# Patient Record
Sex: Male | Born: 1945 | Race: White | Hispanic: No | Marital: Married | Smoking: Former smoker
Health system: Southern US, Community
[De-identification: ages and names within clinical notes are randomized; demographics above are authoritative.]

## PROBLEM LIST (undated history)

## (undated) DIAGNOSIS — Z8719 Personal history of other diseases of the digestive system: Secondary | ICD-10-CM

## (undated) DIAGNOSIS — N365 Urethral false passage: Secondary | ICD-10-CM

## (undated) DIAGNOSIS — I10 Essential (primary) hypertension: Secondary | ICD-10-CM

## (undated) DIAGNOSIS — I209 Angina pectoris, unspecified: Secondary | ICD-10-CM

## (undated) DIAGNOSIS — F0390 Unspecified dementia without behavioral disturbance: Secondary | ICD-10-CM

## (undated) DIAGNOSIS — K219 Gastro-esophageal reflux disease without esophagitis: Secondary | ICD-10-CM

## (undated) DIAGNOSIS — I2510 Atherosclerotic heart disease of native coronary artery without angina pectoris: Secondary | ICD-10-CM

## (undated) DIAGNOSIS — E7800 Pure hypercholesterolemia, unspecified: Secondary | ICD-10-CM

## (undated) DIAGNOSIS — M1990 Unspecified osteoarthritis, unspecified site: Secondary | ICD-10-CM

## (undated) DIAGNOSIS — K449 Diaphragmatic hernia without obstruction or gangrene: Secondary | ICD-10-CM

## (undated) DIAGNOSIS — I639 Cerebral infarction, unspecified: Secondary | ICD-10-CM

## (undated) DIAGNOSIS — N189 Chronic kidney disease, unspecified: Secondary | ICD-10-CM

## (undated) DIAGNOSIS — F329 Major depressive disorder, single episode, unspecified: Secondary | ICD-10-CM

## (undated) DIAGNOSIS — I219 Acute myocardial infarction, unspecified: Secondary | ICD-10-CM

## (undated) DIAGNOSIS — Z860101 Personal history of adenomatous and serrated colon polyps: Secondary | ICD-10-CM

## (undated) DIAGNOSIS — M19041 Primary osteoarthritis, right hand: Secondary | ICD-10-CM

## (undated) DIAGNOSIS — N400 Enlarged prostate without lower urinary tract symptoms: Secondary | ICD-10-CM

## (undated) DIAGNOSIS — D649 Anemia, unspecified: Secondary | ICD-10-CM

## (undated) DIAGNOSIS — C801 Malignant (primary) neoplasm, unspecified: Secondary | ICD-10-CM

## (undated) HISTORY — PX: TONSILLECTOMY: SUR1361

## (undated) HISTORY — PX: COLONOSCOPY: SHX174

## (undated) HISTORY — PX: ESOPHAGOGASTRODUODENOSCOPY: SHX5428

## (undated) HISTORY — PX: CARDIAC CATHETERIZATION: SHX172

## (undated) HISTORY — PX: JOINT REPLACEMENT: SHX530

## (undated) HISTORY — PX: REPLACEMENT TOTAL KNEE: SUR1224

---

## 2010-02-07 ENCOUNTER — Ambulatory Visit: Payer: Self-pay

## 2010-03-28 ENCOUNTER — Ambulatory Visit: Payer: Self-pay | Admitting: Anesthesiology

## 2010-04-04 ENCOUNTER — Ambulatory Visit: Payer: Self-pay | Admitting: General Practice

## 2012-02-10 ENCOUNTER — Emergency Department: Payer: Self-pay | Admitting: Emergency Medicine

## 2012-02-10 LAB — CBC
HCT: 42.3 % (ref 40.0–52.0)
MCH: 32.2 pg (ref 26.0–34.0)
MCV: 93 fL (ref 80–100)
Platelet: 124 10*3/uL — ABNORMAL LOW (ref 150–440)
RDW: 13.3 % (ref 11.5–14.5)

## 2012-02-10 LAB — COMPREHENSIVE METABOLIC PANEL
Albumin: 3.7 g/dL (ref 3.4–5.0)
Alkaline Phosphatase: 78 U/L (ref 50–136)
Bilirubin,Total: 0.6 mg/dL (ref 0.2–1.0)
Calcium, Total: 8.4 mg/dL — ABNORMAL LOW (ref 8.5–10.1)
Chloride: 106 mmol/L (ref 98–107)
Co2: 29 mmol/L (ref 21–32)
Creatinine: 1.17 mg/dL (ref 0.60–1.30)
EGFR (African American): >60
EGFR (Non-African Amer.): >60
Glucose: 102 mg/dL — ABNORMAL HIGH (ref 65–99)
Osmolality: 283 (ref 275–301)
SGOT(AST): 19 U/L (ref 15–37)
Sodium: 140 mmol/L (ref 136–145)

## 2012-02-10 LAB — LIPASE, BLOOD: Lipase: 101 U/L (ref 73–393)

## 2012-02-10 LAB — TROPONIN I: Troponin-I: <0.02 ng/mL

## 2012-03-13 ENCOUNTER — Ambulatory Visit: Payer: Self-pay | Admitting: Unknown Physician Specialty

## 2012-03-18 ENCOUNTER — Ambulatory Visit: Payer: Self-pay | Admitting: Unknown Physician Specialty

## 2012-03-19 LAB — PATHOLOGY REPORT

## 2012-06-18 ENCOUNTER — Ambulatory Visit: Payer: Self-pay | Admitting: Unknown Physician Specialty

## 2013-02-01 ENCOUNTER — Ambulatory Visit: Payer: Self-pay

## 2013-06-28 DIAGNOSIS — I10 Essential (primary) hypertension: Secondary | ICD-10-CM | POA: Insufficient documentation

## 2013-08-02 ENCOUNTER — Ambulatory Visit: Payer: Self-pay | Admitting: Unknown Physician Specialty

## 2013-08-04 ENCOUNTER — Ambulatory Visit: Payer: Self-pay | Admitting: Unknown Physician Specialty

## 2013-08-06 LAB — PATHOLOGY REPORT

## 2013-08-16 ENCOUNTER — Ambulatory Visit: Payer: Self-pay | Admitting: Unknown Physician Specialty

## 2014-05-04 ENCOUNTER — Ambulatory Visit: Payer: Self-pay | Admitting: General Practice

## 2014-05-12 DIAGNOSIS — M1710 Unilateral primary osteoarthritis, unspecified knee: Secondary | ICD-10-CM | POA: Insufficient documentation

## 2014-05-12 DIAGNOSIS — M179 Osteoarthritis of knee, unspecified: Secondary | ICD-10-CM | POA: Insufficient documentation

## 2014-05-16 ENCOUNTER — Inpatient Hospital Stay
Admit: 2014-05-16 | Disposition: A | Discharge status: Home / Self Care | Payer: Self-pay | Attending: General Practice | Admitting: General Practice

## 2014-05-17 LAB — BASIC METABOLIC PANEL
Anion Gap: 7 (ref 7–16)
BUN: 20 mg/dL
CHLORIDE: 107 mmol/L
CREATININE: 1.02 mg/dL
Calcium, Total: 7.9 mg/dL — ABNORMAL LOW
Co2: 25 mmol/L
GLUCOSE: 101 mg/dL — AB
POTASSIUM: 3.7 mmol/L
SODIUM: 139 mmol/L

## 2014-05-17 LAB — HEMOGLOBIN: HGB: 10.9 g/dL — AB (ref 13.0–18.0)

## 2014-05-17 LAB — PLATELET COUNT: PLATELETS: 129 10*3/uL — AB (ref 150–440)

## 2014-05-18 LAB — BASIC METABOLIC PANEL
Anion Gap: 6 — ABNORMAL LOW (ref 7–16)
BUN: 14 mg/dL
CALCIUM: 8.2 mg/dL — AB
CHLORIDE: 101 mmol/L
CO2: 28 mmol/L
Creatinine: 0.96 mg/dL
EGFR (African American): >60
Glucose: 123 mg/dL — ABNORMAL HIGH
POTASSIUM: 4 mmol/L
Sodium: 135 mmol/L

## 2014-05-18 LAB — PLATELET COUNT: PLATELETS: 125 10*3/uL — AB (ref 150–440)

## 2014-05-18 LAB — HEMOGLOBIN: HGB: 11.6 g/dL — AB (ref 13.0–18.0)

## 2014-06-02 DIAGNOSIS — Z96659 Presence of unspecified artificial knee joint: Secondary | ICD-10-CM | POA: Insufficient documentation

## 2014-06-02 DIAGNOSIS — Z96651 Presence of right artificial knee joint: Secondary | ICD-10-CM | POA: Insufficient documentation

## 2014-06-19 NOTE — Consult Note (Signed)
PATIENT NAME:  Craig [name], Craig [name] MR#:  [id number] DATE OF BIRTH:  07/14/1945  DATE OF CONSULTATION:  05/18/2014  REFERRING PHYSICIAN:   CONSULTING PHYSICIAN:  Earleen Newport. Volanda Napoleon, MD  REFERRING PHYSICIAN: Laurice Record. Holley Bouche., M.D.   PRIMARY CARE PHYSICIAN: Tracie Harrier, M.D., with the [facility]   REASON FOR CONSULTATION: Hypertension.   HISTORY OF PRESENT ILLNESS: Craig [name] was admitted on March 28 for a right total knee replacement by Dr. Marry Guan under spinal anesthesia. He does have a history of hypertension, which is usually controlled on lisinopril 30 mg daily. He has been receiving lisinopril during this hospitalization. He reports that his usual blood pressure is somewhere between 130 to 140/60 to 65. Blood pressures were very well controlled on admission; however, yesterday, March 29, blood pressures escalated throughout the day up to a high of 192/98. He also reports a headache and a left eye vision abnormality. The headache is new and has developed during the hospitalization. The left eye vision abnormality is described as a translucent floating object, which was present prior to this hospitalization. He denies chest pain or shortness of breath. No dependent edema. He does report significant pain after the surgery. Currently his pain is 6 to 7 on the 10 scale. He states that yesterday, his pain was "out of this world," 10 out of 10 on a 10 pain scale. Hospitalist services have been asked to evaluate and assist with management of hypertension.   PAST MEDICAL HISTORY:  1. Hypertension.  2. Hyperlipidemia.  3. History of duodenal ulcer.  4. Gastroesophageal reflux disease.  5. Benign prosthetic hypertrophy.   SOCIAL HISTORY: He lives at home with his wife. He does not smoke cigarettes. He drinks approximately 2 ounces of alcohol every 6 months. 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 He does not use home oxygen, a wheelchair, or any other assistive devices.   FAMILY  MEDICAL HISTORY: Positive for lung cancer in his father, cancer of an unknown primary in his mother. No family medical history of coronary artery disease, strokes or renal disease.    HOME MEDICATIONS: 1. Acetaminophen 500 mg 2 tablets once a day as needed for pain.  2. Aspirin 81 mg daily.  3. Tramadol 50 mg 1 to 2 tablets every 4 hours as needed for pain.  4. Lisinopril 30 mg 1 tablet once a day.  5. Tums 500 mg 2 tablets 3 times a day as needed for indigestion.  6. Meclizine 25 mg 1 tablet 3 times a day as needed for sinus issues or dizziness.  7. Zyrtec 10 mg 1 tablet once a day in the morning.  8. Simvastatin 40 mg 1 tablet once a day at bedtime.  9. Docusate sodium 100 mg 1 tablet twice a day.  10. Fluticasone 0.05 mg/inhalation 2 sprays to each nostril once a day.  11. Omeprazole 40 mg 1 tablet daily.  12. Once a day men's health formula multivitamin.  13. Vitamin E 400 international units 2 capsules once a day in the morning.   ALLERGIES: THE PATIENT IS ALLERGIC TO PEANUTS.   PAST SURGICAL HISTORY:  1. Right foot surgery.  2. Left knee surgery.  3. Skin cancer removal from the left arm.   REVIEW OF SYSTEMS:  GENERAL: Negative for fatigue, weakness, or change in weight.  HEENT: Positive for left eye visual disturbance as described above; otherwise, no other changes in vision or hearing. No pain in the eyes or ears. No sore throat or difficulty swallowing.  RESPIRATORY: No shortness of breath, cough, wheezing, hemoptysis, or history of COPD.  CARDIOVASCULAR: No chest pain, orthopnea, palpitations, syncope, or edema. ABDOMEN: No abdominal pain. No diarrhea, constipation, melena, or other change in bowel habits.  MUSCULOSKELETAL: Positive for pain in the right knee; otherwise no new pain in the neck, back, shoulders, knees, or hips. No history of gout.  SKIN: No new skin lesions or rashes. He does have a new surgical wound over the right knee.  NEUROLOGIC: No focal numbness or  weakness. No confusion. No history of CVA, no seizure.  PSYCHIATRIC: No history of bipolar disorder or schizophrenia.   PHYSICAL EXAMINATION:  VITAL SIGNS: Temperature 100 degrees, pulse 76, respirations 18, blood pressure 173/88, oxygenation 96% on room air.  HEENT: Pupils equal, round, and reactive to light. Conjunctivae clear. Extraocular motion intact. Oral mucous membranes are pink and moist. Good dentition.  NECK: Supple. No cervical lymphadenopathy. Trachea is midline.  RESPIRATORY: Lungs are clear to auscultation bilaterally with good air movement. No wheezing, rhonchi or rales.  CARDIOVASCULAR: Regular rate and rhythm. No murmurs, rubs, or gallops. No carotid bruits. No peripheral edema. Peripheral pulses 1+.  ABDOMEN: Soft, nontender, nondistended. Bowel sounds are normal. No guarding, no rebound, no hepatosplenomegaly.  SKIN: No unusual rashes or lesions. The right knee is bandaged and in a cooling wrap, unable to see be the surgical incision.  MUSCULOSKELETAL: Strength is 5/5 in the upper extremities, and left lower extremity. I did not test the right extremity.  NEUROLOGIC: Cranial nerves II through XII grossly intact. Strength and sensation intact. Nonfocal.  PSYCHIATRIC: The patient is alert and oriented with good insight into his clinical conditions.   LABORATORY DATA: Sodium 135, potassium 4.0, chloride 101, bicarbonate 28, BUN 14, creatinine 0.96, glucose 123, calcium 8.2. White blood cells 4.4, hemoglobin 11.6, platelets 125,000. INR is 1.0.   UA is negative for signs of infection.   IMAGING: Right knee x-ray shows satisfactory postoperative right knee replacement.   EKG shows sinus bradycardia, possible LVH.   ASSESSMENT AND PLAN: 1. Hypertension: He does have history of hypertension, which is usually controlled on lisinopril 30 mg daily. He is currently on the same. He did receive 1 dose of clonidine early this morning of 0.1 mg when his blood pressure was 191/95, which  has brought his blood pressure down slightly during the day, but it seems to be creeping back up. I think that his escalated blood pressure is due to pain. I will place orders for hydralazine for systolic blood pressure greater than 364 or diastolics over 680. I will not start a new chronic antihypertenisve as he is usually well controlled. I would recommend increasing his pain regimen or assessing more frequently for pain control.  2. Fever: The patient has had 2 temperatures of 100 degrees or greater. We will obtain blood cultures. He does not have any other signs or symptoms of infection. His increased temperature may likely be due to pain and postsurgical changes. Hold off on antibiotics. 3. Acute blood loss anemia: Initial hemoglobin 13.7 dropped to 10.9 after surgery and is now stabilizing back at 11.6. Continue to monitor. 4. Thrombocytopenia: The patient had platelets of 142,000 on admission, and are down to 125,000. He is on Lovenox for deep vein thrombosis prophylaxis and platelets will need to be monitored carefully.  5. Hyperlipidemia: Continue statin.  6. Gastroesophageal reflux disease: Continue with PPI.  7. Postsurgical recovery: Per primary team, orthopedics.  8. Deep vein thrombosis prophylaxis: Lovenox.   CODE STATUS:  The patient is a FULL CODE.   Thank you for this consultation. Dr. Ginette Pitman is the patient's primary care provider, and he will be seeing this patient in the morning.   TIME SPENT ON CONSULTATION: 40 minutes.    ____________________________ Earleen Newport. Volanda Napoleon, MD cpw:JT D: 05/18/2014 12:02:57 ET T: 05/18/2014 13:11:02 ET JOB#: [id number]  cc: Barnetta Chapel P. Volanda Napoleon, MD, <Dictator> Aldean Jewett MD ELECTRONICALLY SIGNED 05/18/2014 18:28

## 2014-06-19 NOTE — Discharge Summary (Signed)
PATIENT NAME:  Craig [name], Craig [name] MR#:  [id number] DATE OF BIRTH:  24-May-1945  SUPERVISING PHYSICIAN:  Dr. Skip Estimable.  ADMITTING DIAGNOSIS:  Degenerative arthrosis of right knee.   DISCHARGE DIAGNOSIS:  Degenerative arthrosis of the right knee.   HISTORY OF PRESENT ILLNESS:  The patient is a pleasant 69 year old gentleman who has been followed at [facility] for progression of right knee pain.  The patient has had a long history of progressive knee pain.  He did have a left knee arthroscopy several years ago.  The patient was complaining of pain mostly along the medial aspect of the knee.  He states that the pain was aggravated with weightbearing activities.  He had not seen any significant improvement in his condition despite the use of Tylenol and tramadol as well as activity modification.  He was unable to tolerate nonsteroidal anti-inflammatory drugs secondary to GI bleed and issues.  He was noted to have difficulty with ascending and descending stairs and had actually gone to coming down stairs backwards.  On occasion, he was noted to have some swelling but denied any locking or giving-way of the knee.  The patient states that his pain had progressed to the point that it was significantly interfering with his activities of daily living.  X-rays taken in [facility] showed narrowing of the medial cartilage space with associated varus alignment.  He was noted to have osteophyte as well as subchondral sclerosis.  After discussion of the risks and benefits of surgical intervention, the patient expressed his understanding of the risks and benefits and agreed for plans for surgical intervention.   PROCEDURE:  Right total knee arthroplasty using computer-assisted navigation.   ANESTHESIA:  Spinal.   SOFT TISSUE RELEASE:  Anterior cruciate ligament, posterior cruciate ligament, deep and superficial medial collateral ligaments, as well as the patellofemoral ligament.   IMPLANTS UTILIZED:  DePuy  PFC Sigma, size 6, posterior stabilized femoral component cemented, size 5 MBT tibial component cemented, 38 mm 3-pegged oval dome patella cemented, and a 10 mm stabilized rotating platform polyethylene insert.   HOSPITAL COURSE:  The patient tolerated the procedure very well.  He had no complications. He was then taken to the PACU where he was stabilized and then transferred to the orthopedic floor.  The patient began receiving anticoagulation therapy with Lovenox 30 mg subcutaneous q.   12 hours per anesthesia and pharmacy protocol.  He was fitted with TED stockings bilaterally. These were allowed to be removed 1 hour per 8-hour shift.  The right one was applied on day 2 following removal of the Hemovac and dressing change.  He was also fitted with AVI compression foot pumps bilaterally set at 80 mmHg.  His calves have been nontender.  There has been no evidence of any DVTs.  Negative Homans sign.  The heels were elevated off the bed using rolled towels.   Vital signs have been stable.  He has been afebrile.  Hemodynamically he is stable.  No transfusions were given other than the Autovac transfusion given in the first 6 hours postoperatively.   Physical therapy was initiated on day 1 for gait training and transfers.  He has done very well. Upon being discharged, was ambulating greater than 200 feet; was able go up 4 steps; was independent with bed-to-chair transfers.  Occupational therapy was also initiated on day 1 for ADL assistive devices.  Day 1 following surgery, the patient was complaining of some lower back pain.  Subsequently, a heating pad was applied.  This seemed to help him.  Also, patient did have some urinary retention but this was controlled with bladder training.  Upon being discharged, he was urinating on his own without any complications.   The patient's IV, Foley, and Hemovac were discontinued on day 2.   DISPOSITION:  The patient is discharged to home in improved stable condition.    DISCHARGE INSTRUCTIONS:  He may continue weightbearing as tolerated.  Continue using a rolling walker until cleared by physical therapy to go to a quad cane.  He will receive home health PT.  He was instructed on elevation of the lower extremities.  Continue TED stockings. These are to be worn during the day but may be removed at night.  Incentive spirometer q. 1 hour while awake.  Encourage cough, deep breathing q. 2 hours while awake.  He is placed on a regular diet.  Continue Polar Care to the surgical leg maintaining a temperature of 40-50 degrees Fahrenheit.  He was instructed in wound care.  He is not to get the wound wet until the staples are removed in 2 weeks.  He has a followup appointment at [facility] on April 12 at 8:45. He is to call sooner if any complications or fevers of 101.5.   PAST MEDICAL HISTORY:   Hyperlipidemia, hypertension, low testosterone, neck pain, gastroesophageal reflux disease, chronic sinusitis, peptic ulcer disease, hiatal hernia, COPD.   DISCHARGE MEDICATIONS:   The patient was given a prescription for Lovenox 40 mg subcutaneously daily for 14 days then discontinue and begin taking one 81 mg enteric-coated aspirin.  He was also given a prescription for oxycodone 5-10 mg q. 4-6 hours p.r.n. for pain and tramadol  50-100 mg q. 4-6 hours p.r.n. for pain.     ____________________________ Vance Peper, PA jrw:kc D: 05/30/2014 16:03:00 ET T: 05/30/2014 17:36:09 ET JOB#: [id number]  cc: Vance Peper, PA, <Dictator> JON WOLFE PA ELECTRONICALLY SIGNED 06/02/2014 22:33

## 2014-06-19 NOTE — Op Note (Signed)
PATIENT NAME:  Craig [name], Craig [name] MR#:  [id number] DATE OF BIRTH:  24-Mar-1945  DATE OF PROCEDURE:  05/16/2014  PREOPERATIVE DIAGNOSIS:  Degenerative arthrosis of the right knee.   POSTOPERATIVE DIAGNOSIS:  Degenerative arthrosis of the right knee (primary).   PROCEDURE PERFORMED:  Right total knee arthroplasty using computer-assisted navigation.   SURGEON:  Laurice Record. Holley Bouche., MD   ASSISTANT:  Vance Peper, PA (required to maintain retraction throughout the procedure).   ANESTHESIA:  Spinal.   ESTIMATED BLOOD LOSS:  200 mL.   FLUIDS REPLACED:  1400 mL of crystalloid.   TOURNIQUET TIME:  106 minutes.   DRAINS:  Two medium drains to reinfusion system.   SOFT TISSUE RELEASES:  Anterior cruciate ligament, posterior cruciate ligament, deep and superficial medial collateral ligament, and patellofemoral ligament.   IMPLANTS UTILIZED:  DePuy FC Sigma size 6 posterior stabilized femoral component (cemented), size 5 MBT tibial component (cemented), 38 mm 3-peg oval dome patella (cemented), and a 10 mm stabilized rotating platform polyethylene insert.   INDICATIONS FOR SURGERY:  The patient is a 69 year old male who has been seen for complaints of progressive right knee pain. X-rays demonstrated severe degenerative changes in tricompartmental fashion with relative varus deformity. After discussion of the risks and benefits of surgical intervention, the patient expressed understanding of the risks and benefits and agreed with plans for surgical intervention.   PROCEDURE IN DETAIL:  The patient was brought to the operating room, and after adequate spinal anesthesia was achieved, a tourniquet was placed on the patient's upper right thigh. The patient's right knee and leg were cleaned and prepped with alcohol and DuraPrep, draped in the usual sterile fashion. A "timeout" was performed as per usual protocol. The right lower extremity was exsanguinated using an Esmarch, and the tourniquet was inflated to  300 mmHg. An anterior and longitudinal incision was made followed by a standard mid vastus approach. A moderate effusion was evacuated. The deep fibers of the medial collateral ligament were elevated in subperiosteal fashion off of the medial flare of the tibia so as to maintain a continuous soft tissue sleeve. The patella was subluxed laterally, and the patellofemoral ligament was incised. Inspection of the knee demonstrated severe degenerative changes in a tricompartmental fashion with full-thickness loss of articular cartilage to the medial compartment. Prominent osteophytes were debrided using a rongeur. Anterior and posterior cruciate ligaments were excised. Two 4.0 mm Schanz pins were inserted into the femur and into the tibia for attachment of the array of trackers used for computer-assisted navigation. Hip center was identified using circumduction technique. Distal landmarks were mapped using the computer. The distal femur and proximal tibia were mapped using the computer. The distal femoral cutting guide was positioned using computer-assisted navigation so as to achieve a 5-degree distal valgus cut. Cut was performed and verified using the computer. The distal femur was sized and it was felt that a size 6 femoral component was appropriate. A size 6 cutting guide was positioned, and the anterior cut was performed and verified using the computer. This was followed by completion of the posterior and chamfer cuts. Femoral cutting guide for the central box was then positioned. A central box cut was performed. Attention was then directed to the proximal tibia. Medial and lateral menisci were excised. The extramedullary tibial cutting guide was positioned using computer-assisted navigation so as to achieve a 0-degree varus/valgus alignment with 0-degree posterior slope. Cut was performed and verified using the computer. The proximal tibia was sized and it was felt  that a size 5 tibial tray was appropriate. A size  5 tibial tray and size 6 femoral trial were placed followed by insertion of a 10 mm polyethylene insert. The knee was felt to be tight medially. A Cobb elevator was used to elevate the superficial fibers of the medial collateral ligament. This improved the medial and lateral soft tissue balancing, but the patient was still noted to be tight both in extension and in flexion. Trial components were removed, and the extramedullary tibial cutting guide was positioned so as to resect an additional 2 mm of bone. This was performed and verified using the computer. Trials were placed and good medial and lateral soft tissue balance was appreciated as well as full extension noted. Finally, the patella was cut and prepared so as to accommodate a 38 mm 3-peg oval dome patella. Patellar trial was placed and the knee was placed through a range of motion with excellent patellar tracking appreciated. The femoral trial was removed. Central post hole for the tibial component was reamed followed by insertion of a keel punch. Tibial trials were then removed. The cut surfaces of bone were irrigated with copious amounts of normal saline with antibiotic solution using pulsatile lavage and then suctioned dry. Polymethyl methacrylate cement was prepared in the usual fashion using a vacuum mixer. Cement was applied to the cut surface of the proximal tibia as well as along the undersurface of a size 5 MBT component. The tibial component was positioned and impacted into place. Excess cement was removed using [employer] fast streamer. Cement was then applied to the cut surface of the femur as well as along the posterior flanges of a size 6 posterior stabilized femoral component. Femoral component was positioned and impacted into place. Excess cement was removed using [employer] fast streamer. A 10 mm stabilized rotating platform polyethylene insert trial was placed, and the knee was brought in full extension with steady axial compression applied. Finally, cement  was applied to the backside of a 38 mm 3-peg oval dome patella and the patellar component was positioned and patellar clamp applied. Excess cement was removed using [employer] fast streamer.   After adequate curing of cement, the tourniquet was deflated after total tourniquet time of 106 minutes. Hemostasis was achieved using electrocautery. The knee was irrigated with copious amounts of normal saline with antibiotic solution, pulsatile lavage, and then suctioned dry. The knee was inspected for any residual cement debris. Then, 20 mL of 1.3% Exparel and 40 mL of normal saline was then injected along the posterior capsule, medial and lateral gutters, and along the arthrotomy site. A 10 mm stabilized rotating platform polyethylene insert was inserted and the knee was placed through a range of motion. Excellent patellar tracking was appreciated and excellent mediolateral soft tissue balancing was noted. Two medium drains were placed in the wound bed and brought out through a separate stab incision to be attached to a reinfusion system. The medial parapatellar portion of the incision was reapproximated using interrupted sutures of #1 Vicryl. The subcutaneous tissue was approximated in layers using first #0 Vicryl followed by #2-0 Vicryl. Then, 30 mL of 0.25% Marcaine with epinephrine was injected along the incision site into the subcutaneous tissue. The skin was then closed with skin staples. A sterile dressing was applied.   The patient tolerated the procedure well. He was transported to the recovery room in stable condition.    ____________________________ Laurice Record. Holley Bouche., MD jph:nb D: 05/16/2014 16:26:44 ET T: 05/17/2014 06:51:10 ET JOB#: [id number]  cc: Jeneen Rinks  Evalina Field., MD, <Dictator> Laurice Record Holley Bouche MD ELECTRONICALLY SIGNED 05/28/2014 10:02

## 2014-07-03 DIAGNOSIS — N401 Enlarged prostate with lower urinary tract symptoms: Secondary | ICD-10-CM | POA: Insufficient documentation

## 2014-07-03 DIAGNOSIS — N138 Other obstructive and reflux uropathy: Secondary | ICD-10-CM | POA: Insufficient documentation

## 2014-09-05 DIAGNOSIS — E785 Hyperlipidemia, unspecified: Secondary | ICD-10-CM | POA: Insufficient documentation

## 2014-09-05 DIAGNOSIS — N400 Enlarged prostate without lower urinary tract symptoms: Secondary | ICD-10-CM | POA: Insufficient documentation

## 2015-04-04 IMAGING — CT CT ABDOMEN WO/W CM
2 of 10 series · 12 of 46 positions shown, 19 images · IV contrast (isovue)
Comparison: None.

CLINICAL DATA: Abnormal ultrasound with large cystic mass in LEFT
renal fossa

EXAM:
CT ABDOMEN WITHOUT AND WITH CONTRAST
TECHNIQUE: Multidetector CT imaging of the abdomen was performed following the
standard protocol before and following the bolus administration of
intravenous contrast. Additional imaging was performed prior to IV
contrast. Sagittal and coronal MPR images reconstructed from axial
data set.
CONTRAST:  100 cc Isovue 300 IV. Dilute oral contrast not
administered for this indication.

[Series 6: renal venous · axial · portal-venous · 0.75mm/px · z∈[-788,-530]mm · 10 of 159 slices shown, 16 images]
[im 15/159  soft-tissue]
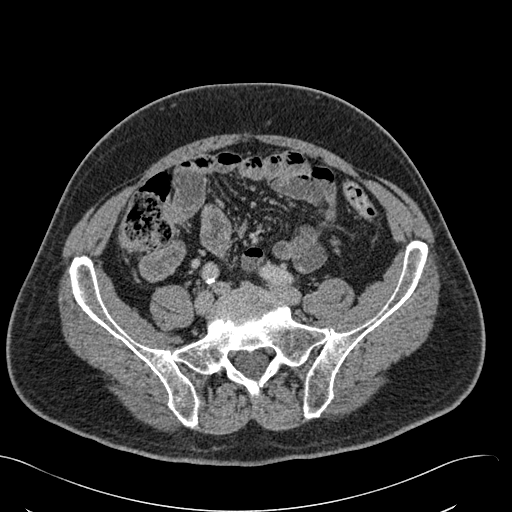
[im 15/159  bone]
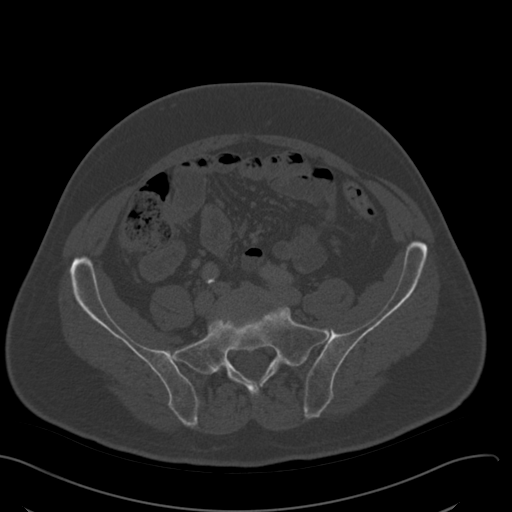
[im 29/159  soft-tissue]
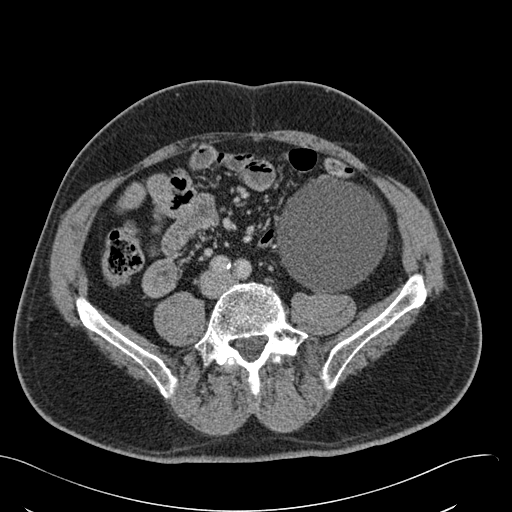
[im 44/159  soft-tissue]
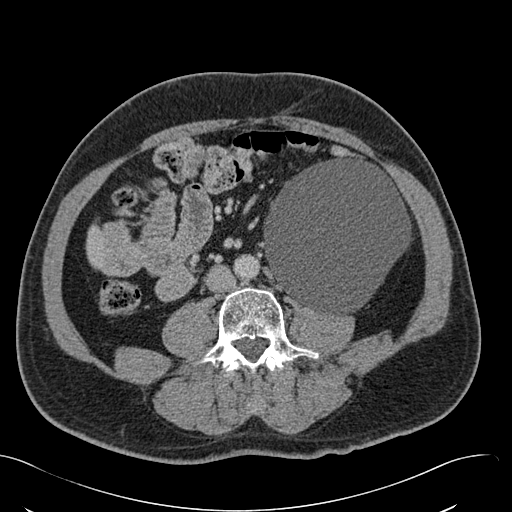
[im 58/159  soft-tissue]
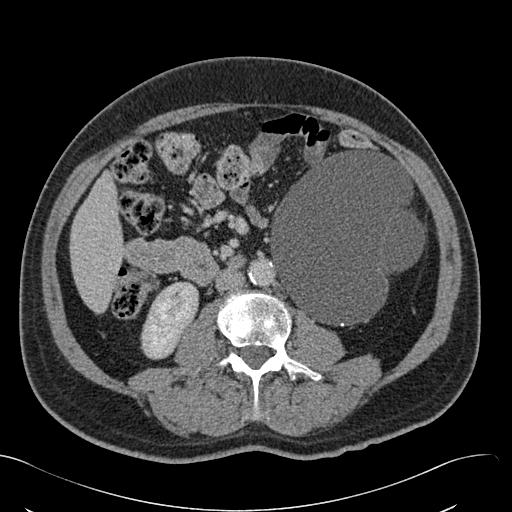
[im 72/159  soft-tissue]
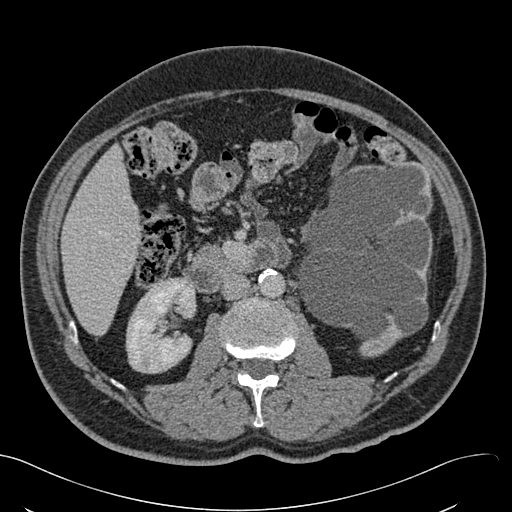
[im 87/159  soft-tissue]
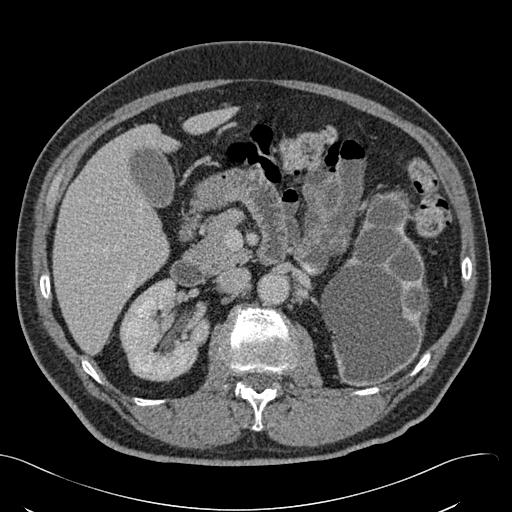
[im 101/159  soft-tissue]
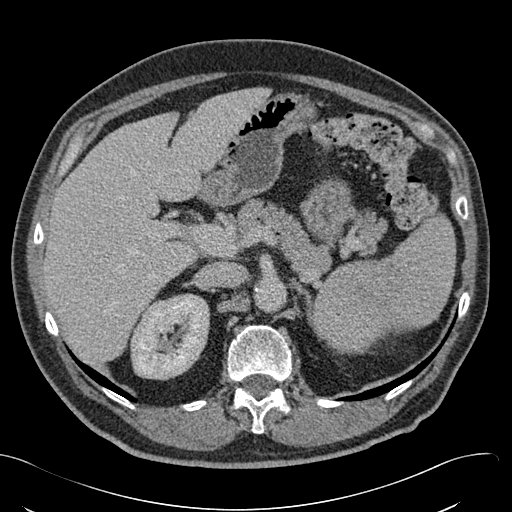
[im 101/159  lung]
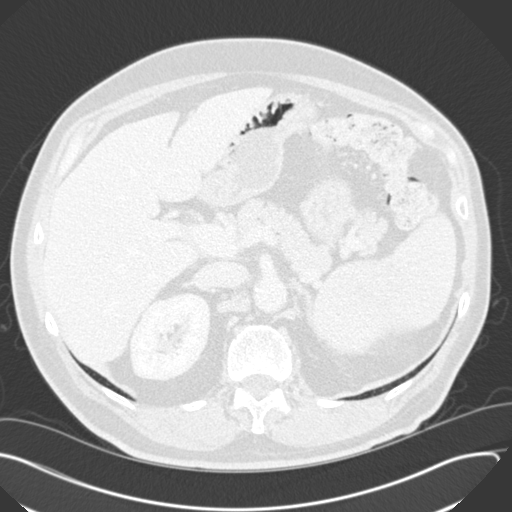
[im 115/159  soft-tissue]
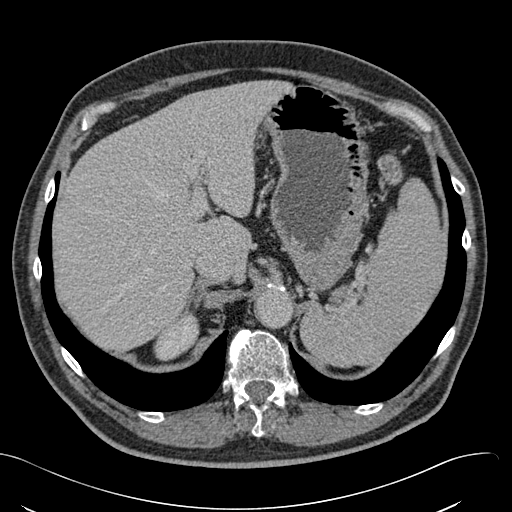
[im 115/159  lung]
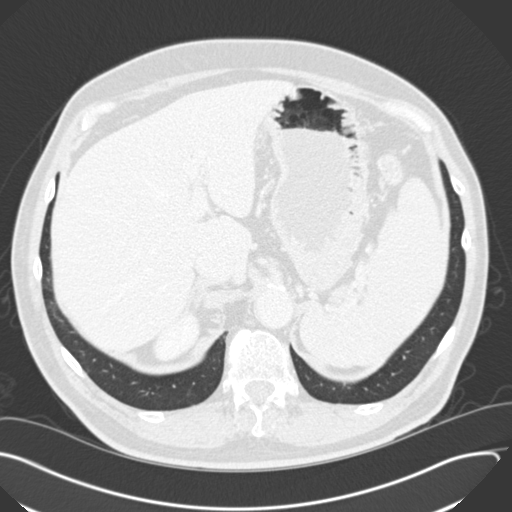
[im 130/159  soft-tissue]
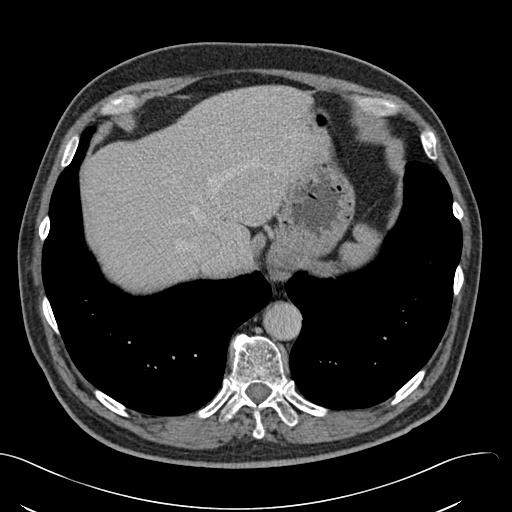
[im 130/159  lung]
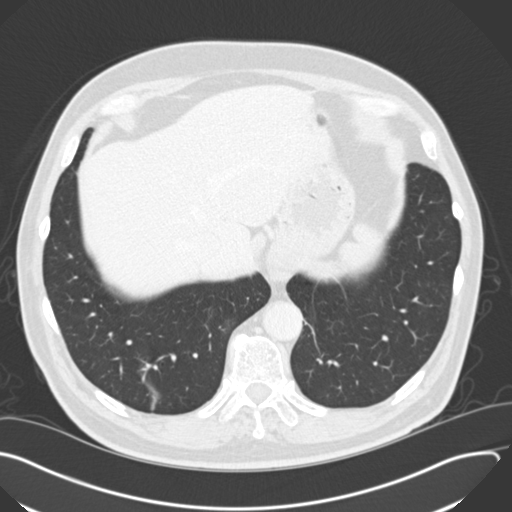
[im 130/159  bone]
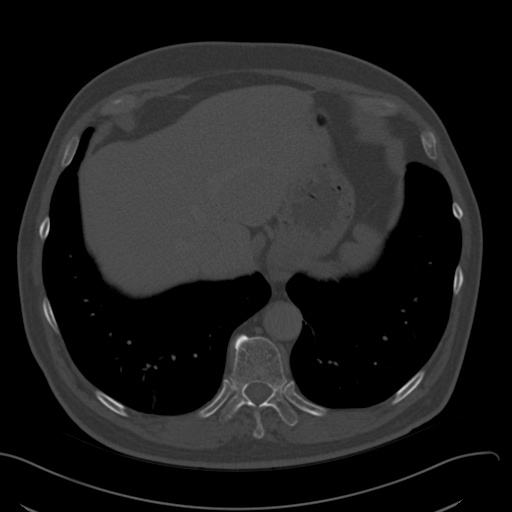
[im 144/159  soft-tissue]
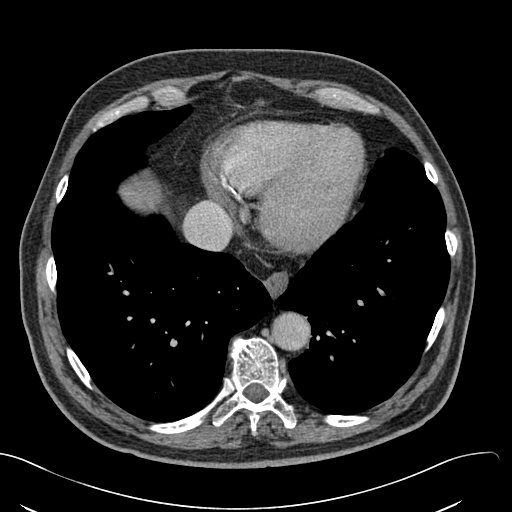
[im 144/159  lung]
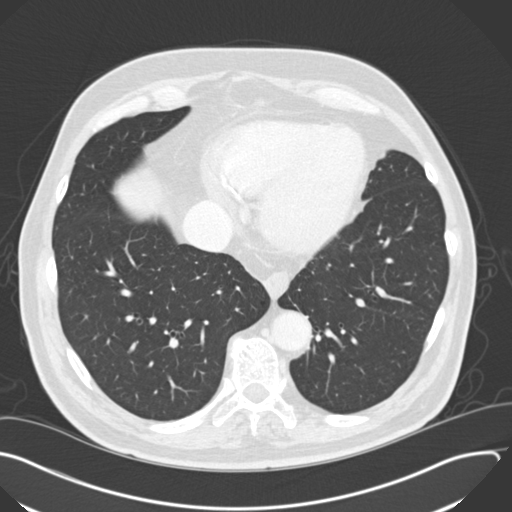

[Series 8: coronal renal without · coronal · non-contrast · 0.88mm/px · 2 of 166 slices shown, 3 images]
[im 56/166  soft-tissue]
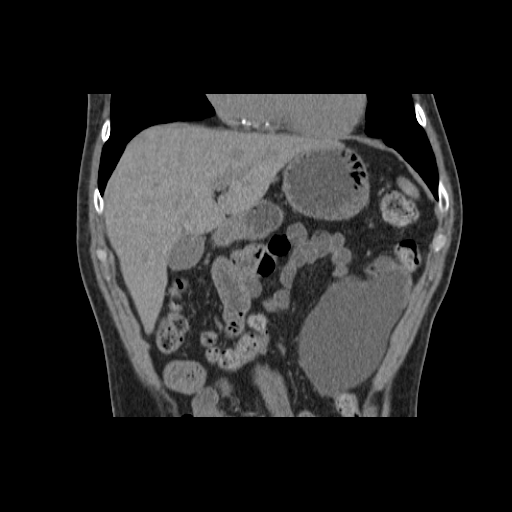
[im 56/166  bone]
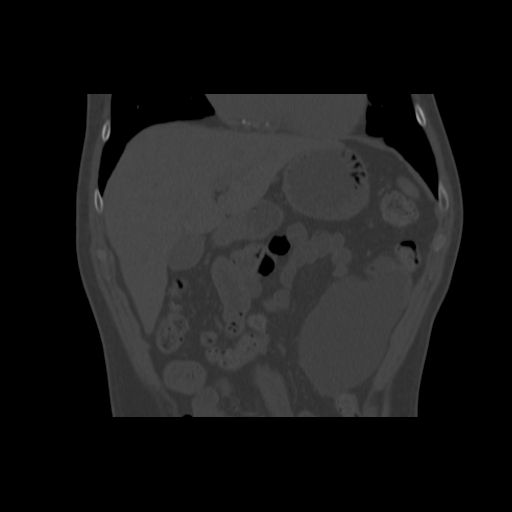
[im 111/166  soft-tissue]
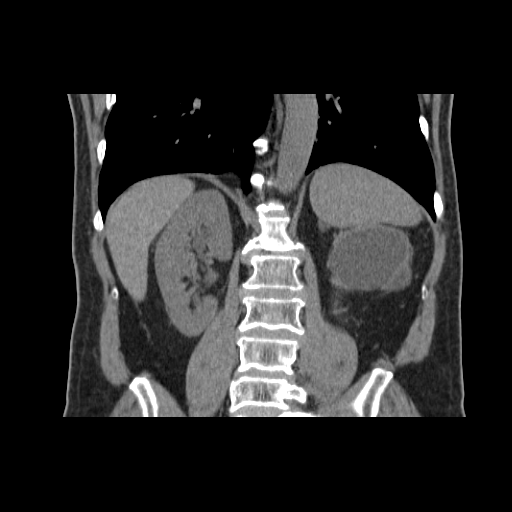

[12 of 46 positions shown; findings below may reference images not displayed]

FINDINGS: Pelvis not imaged.

Lung bases clear.

Markedly hydronephrotic LEFT kidney with marked renal cortical
thinning, residual cortex measuring 2-3 mm thick.

Renal pelvis measures 12.6 cm AP with LEFT kidney overall measuring
approximately 16.7 cm craniocaudal length.

No definite solid renal mass or enhancing renal mass lesion
identified.

No proximal urinary tract calcification.

Proximal LEFT ureter is nondilated likely reflecting LEFT UPJ
obstruction.

Liver, spleen, pancreas, RIGHT kidney, and adrenal glands normal
appearance.

Scattered atherosclerotic calcification.

Stomach and visualized bowel loops normal appearance.

No mass, adenopathy, free fluid, or free air.

Minimal degenerative disc disease changes and osseous
demineralization thoracolumbar spine.
IMPRESSION: Markedly hydronephrotic LEFT kidney likely due to chronic LEFT UPJ
obstruction with secondary marked renal cortical thinning.

No definite evidence of solid or cystic renal mass.

## 2015-05-29 DIAGNOSIS — R350 Frequency of micturition: Secondary | ICD-10-CM | POA: Insufficient documentation

## 2015-09-25 ENCOUNTER — Emergency Department: Payer: Medicare Other

## 2015-09-25 ENCOUNTER — Inpatient Hospital Stay
Admission: EM | Admit: 2015-09-25 | Discharge: 2015-09-28 | DRG: 690 | Disposition: A | Discharge status: Home / Self Care | Payer: Medicare Other | Attending: Internal Medicine | Admitting: Internal Medicine

## 2015-09-25 ENCOUNTER — Encounter: Payer: Self-pay | Admitting: Emergency Medicine

## 2015-09-25 DIAGNOSIS — Z96651 Presence of right artificial knee joint: Secondary | ICD-10-CM | POA: Diagnosis present

## 2015-09-25 DIAGNOSIS — Z87891 Personal history of nicotine dependence: Secondary | ICD-10-CM | POA: Diagnosis not present

## 2015-09-25 DIAGNOSIS — Z79899 Other long term (current) drug therapy: Secondary | ICD-10-CM | POA: Diagnosis not present

## 2015-09-25 DIAGNOSIS — N131 Hydronephrosis with ureteral stricture, not elsewhere classified: Secondary | ICD-10-CM | POA: Diagnosis present

## 2015-09-25 DIAGNOSIS — B952 Enterococcus as the cause of diseases classified elsewhere: Secondary | ICD-10-CM | POA: Diagnosis present

## 2015-09-25 DIAGNOSIS — R339 Retention of urine, unspecified: Secondary | ICD-10-CM | POA: Diagnosis present

## 2015-09-25 DIAGNOSIS — R519 Headache, unspecified: Secondary | ICD-10-CM

## 2015-09-25 DIAGNOSIS — N12 Tubulo-interstitial nephritis, not specified as acute or chronic: Secondary | ICD-10-CM | POA: Diagnosis not present

## 2015-09-25 DIAGNOSIS — N400 Enlarged prostate without lower urinary tract symptoms: Secondary | ICD-10-CM | POA: Diagnosis present

## 2015-09-25 DIAGNOSIS — N10 Acute tubulo-interstitial nephritis: Secondary | ICD-10-CM | POA: Diagnosis present

## 2015-09-25 DIAGNOSIS — R51 Headache: Secondary | ICD-10-CM | POA: Diagnosis present

## 2015-09-25 HISTORY — DX: Benign prostatic hyperplasia without lower urinary tract symptoms: N40.0

## 2015-09-25 HISTORY — DX: Pure hypercholesterolemia, unspecified: E78.00

## 2015-09-25 HISTORY — DX: Essential (primary) hypertension: I10

## 2015-09-25 LAB — CBC WITH DIFFERENTIAL/PLATELET
BASOS PCT: 0 %
Basophils Absolute: 0 10*3/uL (ref 0–0.1)
EOS PCT: 0 %
Eosinophils Absolute: 0 10*3/uL (ref 0–0.7)
HEMATOCRIT: 43.9 % (ref 40.0–52.0)
HEMOGLOBIN: 15.2 g/dL (ref 13.0–18.0)
LYMPHS PCT: 8 %
Lymphs Abs: 1.2 10*3/uL (ref 1.0–3.6)
MCH: 31.6 pg (ref 26.0–34.0)
MCHC: 34.7 g/dL (ref 32.0–36.0)
MCV: 91.1 fL (ref 80.0–100.0)
MONOS PCT: 17 %
Monocytes Absolute: 2.5 10*3/uL — ABNORMAL HIGH (ref 0.2–1.0)
Neutro Abs: 10.9 10*3/uL — ABNORMAL HIGH (ref 1.4–6.5)
Neutrophils Relative %: 75 %
Platelets: 143 10*3/uL — ABNORMAL LOW (ref 150–440)
RBC: 4.82 MIL/uL (ref 4.40–5.90)
RDW: 13.7 % (ref 11.5–14.5)
WBC: 14.6 10*3/uL — AB (ref 3.8–10.6)

## 2015-09-25 LAB — COMPREHENSIVE METABOLIC PANEL
ALT: 17 U/L (ref 17–63)
AST: 15 U/L (ref 15–41)
Albumin: 3.8 g/dL (ref 3.5–5.0)
Alkaline Phosphatase: 65 U/L (ref 38–126)
Anion gap: 9 (ref 5–15)
BUN: 21 mg/dL — ABNORMAL HIGH (ref 6–20)
CHLORIDE: 104 mmol/L (ref 101–111)
CO2: 23 mmol/L (ref 22–32)
Calcium: 8.7 mg/dL — ABNORMAL LOW (ref 8.9–10.3)
Creatinine, Ser: 1.03 mg/dL (ref 0.61–1.24)
Glucose, Bld: 155 mg/dL — ABNORMAL HIGH (ref 65–99)
POTASSIUM: 3.8 mmol/L (ref 3.5–5.1)
SODIUM: 136 mmol/L (ref 135–145)
Total Bilirubin: 0.9 mg/dL (ref 0.3–1.2)
Total Protein: 6.7 g/dL (ref 6.5–8.1)

## 2015-09-25 LAB — URINALYSIS COMPLETE WITH MICROSCOPIC (ARMC ONLY)
BILIRUBIN URINE: NEGATIVE
Glucose, UA: NEGATIVE mg/dL
HGB URINE DIPSTICK: NEGATIVE
LEUKOCYTES UA: NEGATIVE
Nitrite: NEGATIVE
PROTEIN: 30 mg/dL — AB
SQUAMOUS EPITHELIAL / LPF: NONE SEEN
Specific Gravity, Urine: 1.028 (ref 1.005–1.030)
pH: 5 (ref 5.0–8.0)

## 2015-09-25 LAB — LACTIC ACID, PLASMA: Lactic Acid, Venous: 0.8 mmol/L (ref 0.5–1.9)

## 2015-09-25 LAB — LIPASE, BLOOD: LIPASE: 16 U/L (ref 11–51)

## 2015-09-25 MED ORDER — SIMVASTATIN 40 MG PO TABS
40.0000 mg | ORAL_TABLET | Freq: Every day | ORAL | Status: DC
  Administered 2015-09-25 – 2015-09-28 (×4): 40 mg via ORAL
  Filled 2015-09-25 (×5): qty 1

## 2015-09-25 MED ORDER — MORPHINE SULFATE (PF) 4 MG/ML IV SOLN
INTRAVENOUS | Status: AC
  Administered 2015-09-25: 4 mg via INTRAVENOUS
  Filled 2015-09-25: qty 1

## 2015-09-25 MED ORDER — HEPARIN SODIUM (PORCINE) 5000 UNIT/ML IJ SOLN
5000.0000 [IU] | Freq: Three times a day (TID) | INTRAMUSCULAR | Status: DC
  Administered 2015-09-25 – 2015-09-26 (×3): 5000 [IU] via SUBCUTANEOUS
  Filled 2015-09-25 (×3): qty 1

## 2015-09-25 MED ORDER — CIPROFLOXACIN IN D5W 400 MG/200ML IV SOLN
400.0000 mg | Freq: Once | INTRAVENOUS | Status: AC
  Administered 2015-09-25: 400 mg via INTRAVENOUS
  Filled 2015-09-25: qty 200

## 2015-09-25 MED ORDER — ONDANSETRON HCL 4 MG/2ML IJ SOLN
INTRAMUSCULAR | Status: AC
  Administered 2015-09-25: 4 mg via INTRAVENOUS
  Filled 2015-09-25: qty 2

## 2015-09-25 MED ORDER — SODIUM CHLORIDE 0.9 % IV BOLUS (SEPSIS)
1000.0000 mL | Freq: Once | INTRAVENOUS | Status: AC
  Administered 2015-09-25: 1000 mL via INTRAVENOUS

## 2015-09-25 MED ORDER — IOPAMIDOL (ISOVUE-300) INJECTION 61%
100.0000 mL | Freq: Once | INTRAVENOUS | Status: AC | PRN
  Administered 2015-09-25: 100 mL via INTRAVENOUS

## 2015-09-25 MED ORDER — FLUTICASONE PROPIONATE 50 MCG/ACT NA SUSP
2.0000 | Freq: Every day | NASAL | Status: DC
Start: 2015-09-25 — End: 2015-09-28
  Administered 2015-09-25 – 2015-09-27 (×3): 2 via NASAL
  Filled 2015-09-25: qty 16

## 2015-09-25 MED ORDER — FINASTERIDE 5 MG PO TABS
5.0000 mg | ORAL_TABLET | Freq: Every day | ORAL | Status: DC
  Administered 2015-09-25 – 2015-09-28 (×4): 5 mg via ORAL
  Filled 2015-09-25 (×4): qty 1

## 2015-09-25 MED ORDER — MORPHINE SULFATE (PF) 4 MG/ML IV SOLN
4.0000 mg | Freq: Once | INTRAVENOUS | Status: AC
Start: 2015-09-25 — End: 2015-09-25
  Administered 2015-09-25: 4 mg via INTRAVENOUS
  Filled 2015-09-25: qty 1

## 2015-09-25 MED ORDER — LEVOFLOXACIN IN D5W 250 MG/50ML IV SOLN
250.0000 mg | INTRAVENOUS | Status: DC
  Administered 2015-09-26 – 2015-09-27 (×2): 250 mg via INTRAVENOUS
  Filled 2015-09-25 (×3): qty 50

## 2015-09-25 MED ORDER — HYDROCODONE-ACETAMINOPHEN 5-325 MG PO TABS
1.0000 | ORAL_TABLET | ORAL | Status: DC | PRN
  Administered 2015-09-25: 1 via ORAL
  Administered 2015-09-26 – 2015-09-28 (×8): 2 via ORAL
  Filled 2015-09-25 (×6): qty 2
  Filled 2015-09-25: qty 1
  Filled 2015-09-25 (×2): qty 2

## 2015-09-25 MED ORDER — PHENAZOPYRIDINE HCL 100 MG PO TABS
100.0000 mg | ORAL_TABLET | Freq: Three times a day (TID) | ORAL | Status: DC
  Administered 2015-09-26 – 2015-09-28 (×8): 100 mg via ORAL
  Filled 2015-09-25 (×8): qty 1

## 2015-09-25 MED ORDER — ONDANSETRON HCL 4 MG PO TABS: 4.0000 mg | ORAL_TABLET | Freq: Four times a day (QID) | ORAL | Status: DC | PRN

## 2015-09-25 MED ORDER — PANTOPRAZOLE SODIUM 40 MG PO TBEC
40.0000 mg | DELAYED_RELEASE_TABLET | Freq: Every day | ORAL | Status: DC
  Administered 2015-09-25 – 2015-09-28 (×4): 40 mg via ORAL
  Filled 2015-09-25 (×4): qty 1

## 2015-09-25 MED ORDER — TRAZODONE HCL 50 MG PO TABS: 25.0000 mg | ORAL_TABLET | Freq: Every evening | ORAL | Status: DC | PRN

## 2015-09-25 MED ORDER — TRAMADOL HCL 50 MG PO TABS
50.0000 mg | ORAL_TABLET | Freq: Three times a day (TID) | ORAL | Status: DC
  Administered 2015-09-25 – 2015-09-27 (×6): 50 mg via ORAL
  Filled 2015-09-25 (×8): qty 1

## 2015-09-25 MED ORDER — ONDANSETRON HCL 4 MG/2ML IJ SOLN: 4.0000 mg | Freq: Four times a day (QID) | INTRAMUSCULAR | Status: DC | PRN

## 2015-09-25 MED ORDER — ACETAMINOPHEN 650 MG RE SUPP: 650.0000 mg | Freq: Four times a day (QID) | RECTAL | Status: DC | PRN

## 2015-09-25 MED ORDER — DIATRIZOATE MEGLUMINE & SODIUM 66-10 % PO SOLN
15.0000 mL | Freq: Once | ORAL | Status: AC
  Administered 2015-09-25: 15 mL via ORAL

## 2015-09-25 MED ORDER — MORPHINE SULFATE (PF) 4 MG/ML IV SOLN
4.0000 mg | Freq: Once | INTRAVENOUS | Status: AC
  Administered 2015-09-25: 4 mg via INTRAVENOUS

## 2015-09-25 MED ORDER — SODIUM CHLORIDE 0.9 % IV SOLN
INTRAVENOUS | Status: DC
  Administered 2015-09-25 – 2015-09-28 (×4): via INTRAVENOUS

## 2015-09-25 MED ORDER — BISACODYL 5 MG PO TBEC: 5.0000 mg | DELAYED_RELEASE_TABLET | Freq: Every day | ORAL | Status: DC | PRN

## 2015-09-25 MED ORDER — VITAMIN E 180 MG (400 UNIT) PO CAPS
400.0000 [IU] | ORAL_CAPSULE | Freq: Every day | ORAL | Status: DC
  Administered 2015-09-25 – 2015-09-28 (×4): 400 [IU] via ORAL
  Filled 2015-09-25 (×4): qty 1

## 2015-09-25 MED ORDER — TAMSULOSIN HCL 0.4 MG PO CAPS
0.4000 mg | ORAL_CAPSULE | Freq: Every day | ORAL | Status: DC
  Administered 2015-09-25 – 2015-09-28 (×4): 0.4 mg via ORAL
  Filled 2015-09-25 (×4): qty 1

## 2015-09-25 MED ORDER — ONDANSETRON HCL 4 MG/2ML IJ SOLN
4.0000 mg | Freq: Once | INTRAMUSCULAR | Status: AC
  Administered 2015-09-25: 4 mg via INTRAVENOUS

## 2015-09-25 MED ORDER — ACETAMINOPHEN 325 MG PO TABS
650.0000 mg | ORAL_TABLET | Freq: Four times a day (QID) | ORAL | Status: DC | PRN
  Administered 2015-09-27: 650 mg via ORAL
  Filled 2015-09-25 (×2): qty 2

## 2015-09-25 MED ORDER — DOCUSATE SODIUM 100 MG PO CAPS
100.0000 mg | ORAL_CAPSULE | Freq: Two times a day (BID) | ORAL | Status: DC
  Administered 2015-09-25 – 2015-09-28 (×6): 100 mg via ORAL
  Filled 2015-09-25 (×6): qty 1

## 2015-09-25 MED ORDER — SODIUM CHLORIDE 0.9 % IV BOLUS (SEPSIS)
500.0000 mL | Freq: Once | INTRAVENOUS | Status: AC
  Administered 2015-09-25: 500 mL via INTRAVENOUS

## 2015-09-25 MED ORDER — SODIUM CHLORIDE 0.9 % IV SOLN
1000.0000 mL | Freq: Once | INTRAVENOUS | Status: AC
  Administered 2015-09-25: 1000 mL via INTRAVENOUS

## 2015-09-25 NOTE — ED Provider Notes (Addendum)
Patient presented to the emergency department today with what he thought was urinary retention but only had 200 mL of urine in the bladder. He feels like he was could've the bathroom every few minutes but then is unable to void. Has received 2 rounds of morphine. Senna. Dr. Corky Downs to follow-up with his CAT scan of his abdomen and pelvis. Physical Exam  BP (!) 158/95   Pulse 93   Temp 98.4 F (36.9 C) (Oral)   Resp 18   Ht 5\' 11"  (1.803 m)   Wt 230 lb (104.3 kg)   SpO2 98%   BMI 32.08 kg/m  ----------------------------------------- 7:51 PM on 09/25/2015 -----------------------------------------   Physical Exam Patient getting up from the bed and then walking to the toilet multiple times while I'm in the room. No abdominal tenderness. Mild left CVA tenderness to palpation. ED Course  Procedures CT Abdomen Pelvis W Contrast (Accession [id number]) (Order [id number])  Imaging  Date: 09/25/2015 Department: [facility] EMERGENCY DEPARTMENT Released By/Authorizing: Lavonia Drafts, MD (auto-released)  PACS Images   Show images for CT Abdomen Pelvis W Contrast  Study Result   CLINICAL DATA:  70 year old male with urinary retention and chills since 1600 hours yesterday. Initial encounter.  EXAM: CT ABDOMEN AND PELVIS WITH CONTRAST  TECHNIQUE: Multidetector CT imaging of the abdomen and pelvis was performed using the standard protocol following bolus administration of intravenous contrast.  CONTRAST:  199mL ISOVUE-300 IOPAMIDOL (ISOVUE-300) INJECTION 61%  COMPARISON:  CT abdomen without and with contrast 08/16/2013. Report of nuclear medicine renal scan with Lasix 04/03/2001 (no images available).  FINDINGS: Mild linear atelectasis and respiratory motion artifact at the lung bases. No pericardial or pleural effusion.  No acute osseous abnormality identified.  There is a small volume of pelvic free fluid situated adjacent to distal small bowel loops.  The urinary bladder is mildly to moderately distended, otherwise appears normal. See renal findings below.  Negative rectum. Redundant sigmoid colon with mild diverticulosis, but no active inflammation. The left colon is negative aside from being displaced and compressed into the left lateral abdomen by the abnormal left kidney described below. Negative transverse colon. Oral contrast has reached the mid transverse colon. Redundant hepatic flexure. Negative right colon and appendix. Negative terminal ileum. No dilated small bowel. Small gastric hiatal has increased since 2015. Negative duodenum.  No abdominal free air or free fluid. Negative liver, gallbladder, pancreas. Portal venous system is patent. Aortoiliac calcified atherosclerosis noted. Major arterial structures are patent.  There is mild mass effect on the spleen and regional soft tissue stranding which emanates from the left kidney. Mild mass effect and stranding at the left adrenal gland as well. There is chronic severe left hydronephrosis and thinning of the left renal parenchyma. In 2015 the markedly enlarged left renal collecting system measured up to about 16.5 cm.  Today the abnormal left kidney has further enlarged, with a massively dilated collecting system up to 19.3 cm . Also, the left kidney appears acutely inflamed with new inflammatory stranding throughout the left para renal space. The left kidney is also hypoenhancing relative to that on the right, but this is chronic to an extent. The left ureter appears to be decompressed, and is difficult to identify at its junction with the massively dilated left renal pelvis. No urologic calculus or nephrocalcinosis. Mild, reactive appearing retroperitoneal lymph nodes on the left. Secondary inflammation of the ventral surface of the left psoas muscle. There is also mild secondary stranding in the mesentery of the left  splenic flexure. As expected on delayed renal  images there is contrast excretion from the right kidney, but non on the left.  IMPRESSION: 1. Chronic markedly dilated left kidney has further enlarged since 2015 (massively dilated) and is newly inflamed with confluent left pararenal stranding. Reactive left retroperitoneal lymphadenopathy and secondary inflammation of the adjacent left colon, spleen and psoas muscle. Suspect Acute Superinfection of the chronically abnormal kidney. Recommend Urology consultation. 2. Small volume of pelvic free fluid also felt secondary to #1. 3.  Calcified aortic atherosclerosis.   Electronically Signed   By: Genevie Ann M.D.   On: 09/25/2015 15:56       MDM Discussed the CAT scan result with Dr. Erlene Quan of urology recommends the patient be admitted for IV antibiotics and evaluation for nephrectomy. I explained the plan to the patient as well as his family member who is at the bedside he is understanding when to comply. Will also give him an additional dose of morphine which she is requesting at this time. Because the patient has been constantly up and back to the toilet will also give him a Foley catheter. Patient signed out to Dr. clonidine of the medicine service for admission. Sepsis alert called. The patient's last temperature was 100.3. Likely mounting a fever and also in the context of his elevated white blood cell count and likely infected left kidney on CT his abdomen and pelvis.       Orbie Pyo, MD 09/25/15 1953  Antibiotic choice discussed with Dr. Erlene Quan and she is recommending Cipro.   Orbie Pyo, MD 09/25/15 [phone]

## 2015-09-25 NOTE — ED Provider Notes (Signed)
[facility] Emergency Department Provider Note   ____________________________________________    I have reviewed the triage vital signs and the nursing notes.   HISTORY  Chief Complaint Urinary Retention and Dizziness     HPI Craig [name] is a 70 y.o. male who presents with complaints of inability to urinate and fever. Patient reports he is not been able to urinate significantly for approximately 24 hours. He has been able to get small amounts of urine out. He denies dysuria but reports it is "uncomfortable". He does have some sensation of sitting on a golf ball. He had a fever last night of 101.5. Today complains of mild headache dizziness   Past Medical History:  Diagnosis Date  . BPH (benign prostatic hyperplasia)   . Hypercholesterolemia   . Hypertension     There are no active problems to display for this patient.   Past Surgical History:  Procedure Laterality Date  . REPLACEMENT TOTAL KNEE Right     Prior to Admission medications   Medication Sig Start Date End Date Taking? Authorizing Provider  acetaminophen (TYLENOL 8 HOUR ARTHRITIS PAIN) 650 MG CR tablet Take 650 mg by mouth every 8 (eight) hours as needed for pain.   Yes Historical Provider, MD  cetirizine (ZYRTEC) 10 MG tablet Take 10 mg by mouth daily.   Yes Historical Provider, MD  docusate sodium (COLACE) 100 MG capsule Take 100 mg by mouth 2 (two) times daily.   Yes Historical Provider, MD  finasteride (PROSCAR) 5 MG tablet Take 5 mg by mouth daily.   Yes Historical Provider, MD  fluticasone (FLONASE) 50 MCG/ACT nasal spray Place 2 sprays into both nostrils daily.   Yes Historical Provider, MD  lisinopril (PRINIVIL,ZESTRIL) 30 MG tablet Take 30 mg by mouth daily.   Yes Historical Provider, MD  Multiple Vitamin (MULTIVITAMIN) tablet Take 1 tablet by mouth daily.   Yes Historical Provider, MD  omeprazole (PRILOSEC) 40 MG capsule Take 40 mg by mouth 2 (two) times daily.   Yes  Historical Provider, MD  simvastatin (ZOCOR) 40 MG tablet Take 40 mg by mouth daily.   Yes Historical Provider, MD  tamsulosin (FLOMAX) 0.4 MG CAPS capsule Take 0.4 mg by mouth daily.   Yes Historical Provider, MD  traMADol (ULTRAM) 50 MG tablet Take 50 mg by mouth 3 (three) times daily.   Yes Historical Provider, MD  vitamin E 400 UNIT capsule Take 400 Units by mouth daily.   Yes Historical Provider, MD     Allergies Review of patient's allergies indicates no known allergies.  No family history on file.  Social History Social History  Substance Use Topics  . Smoking status: Former Smoker    Quit date: 09/25/1983  . Smokeless tobacco: Never Used  . Alcohol use Not on file    Review of Systems  Constitutional: Fever last night Eyes: No visual changes.   Cardiovascular: Denies chest pain. Mild dizziness Respiratory: Denies shortness of breath. No cough Gastrointestinal: No abdominal pain.   Genitourinary: As above Musculoskeletal: Negative for back pain. Skin: Negative for rash. Neurological: Positive for headache  10-point ROS otherwise negative.  ____________________________________________   PHYSICAL EXAM:  VITAL SIGNS: ED Triage Vitals [09/25/15 0943]  Enc Vitals Group     BP (!) 171/94     Pulse Rate 79     Resp 20     Temp 98.4 F (36.9 C)     Temp Source Oral     SpO2 95 %  Weight 230 lb (104.3 kg)     Height 5\' 11"  (1.803 m)     Head Circumference      Peak Flow      Pain Score      Pain Loc      Pain Edu?      Excl. in [place]?     Constitutional: Alert and oriented. No acute distress But uncomfortable Eyes: Conjunctivae are normal. eomi Head: Atraumatic. Mouth/Throat: Mucous membranes are moist.   Neck:  Painless ROM Cardiovascular: Normal rate, regular rhythm. Grossly normal heart sounds.  Good peripheral circulation. Respiratory: Normal respiratory effort.  No retractions. Lungs CTAB. Gastrointestinal: Soft and nontender. No distentionOf  bladder.  No CVA tenderness. Genitourinary: No prostate tenderness Musculoskeletal: No lower extremity tenderness nor edema.  Warm and well perfused Neurologic:  Normal speech and language. No gross focal neurologic deficits are appreciated.  Skin:  Skin is warm, dry and intact. No rash noted. Psychiatric: Mood and affect are normal. Speech and behavior are normal.  ____________________________________________   LABS (all labs ordered are listed, but only abnormal results are displayed)  Labs Reviewed  COMPREHENSIVE METABOLIC PANEL - Abnormal; Notable for the following:       Result Value   Glucose, Bld 155 (*)    BUN 21 (*)    Calcium 8.7 (*)    All other components within normal limits  CBC WITH DIFFERENTIAL/PLATELET - Abnormal; Notable for the following:    WBC 14.6 (*)    Platelets 143 (*)    Neutro Abs 10.9 (*)    Monocytes Absolute 2.5 (*)    All other components within normal limits  URINALYSIS COMPLETEWITH MICROSCOPIC ([facility] ONLY) - Abnormal; Notable for the following:    Color, Urine YELLOW (*)    APPearance CLEAR (*)    Ketones, ur TRACE (*)    Protein, ur 30 (*)    Bacteria, UA RARE (*)    All other components within normal limits  CULTURE, BLOOD (ROUTINE X 2)  CULTURE, BLOOD (ROUTINE X 2)  URINE CULTURE  LACTIC ACID, PLASMA  LIPASE, BLOOD  LACTIC ACID, PLASMA   ____________________________________________  EKG   ____________________________________________  RADIOLOGY  CT scan pending ____________________________________________   PROCEDURES  Procedure(s) performed: No    Critical Care performed: No ____________________________________________   INITIAL IMPRESSION / ASSESSMENT AND PLAN / ED COURSE  Pertinent labs & imaging results that were available during my care of the patient were reviewed by me and considered in my medical decision making (see chart for details).  Patient presents with decreased urine output versus urinary retention.  Bladder scan only shows 248 cc making me concerned about possible renal failure especially given his fever. We will send labs, blood cultures, lactic obtain urinalysis and reevaluate  Clinical Course  ----------------------------------------- 3:07 PM on 09/25/2015 -----------------------------------------  Patient continues to await CT scan. At this point I last Dr. Dineen Kid to follow up on the results and distal appropriately ____________________________________________   FINAL CLINICAL IMPRESSION(S) / ED DIAGNOSES  Urinary retention   NEW MEDICATIONS STARTED DURING THIS VISIT:  New Prescriptions   No medications on file     Note:  This document was prepared using Dragon voice recognition software and may include unintentional dictation errors.    Lavonia Drafts, MD 09/25/15 [phone]

## 2015-09-25 NOTE — Progress Notes (Signed)
ANTIBIOTIC CONSULT NOTE - INITIAL  Pharmacy Consult for Levaquin  Indication: UTI  No Known Allergies  Patient Measurements: Height: 5\' 11"  (180.3 cm) Weight: 230 lb (104.3 kg) IBW/kg (Calculated) : 75.3 Adjusted Body Weight:   Vital Signs: Temp: 98.4 F (36.9 C) (08/07 0943) Temp Source: Oral (08/07 0943) BP: 158/95 (08/07 1836) Pulse Rate: 93 (08/07 1836) Intake/Output from previous day: No intake/output data recorded. Intake/Output from this shift: No intake/output data recorded.  Labs:  Recent Labs  09/25/15 0949  WBC 14.6*  HGB 15.2  PLT 143*  CREATININE 1.03   Estimated Creatinine Clearance: 83.2 mL/min (by C-G formula based on SCr of 1.03 mg/dL). No results for input(s): VANCOTROUGH, VANCOPEAK, VANCORANDOM, GENTTROUGH, GENTPEAK, GENTRANDOM, TOBRATROUGH, TOBRAPEAK, TOBRARND, AMIKACINPEAK, AMIKACINTROU, AMIKACIN in the last 72 hours.   Microbiology: No results found for this or any previous visit (from the past 720 hour(s)).  Medical History: Past Medical History:  Diagnosis Date  . BPH (benign prostatic hyperplasia)   . Hypercholesterolemia   . Hypertension     Medications:   (Not in a hospital admission) Assessment: CrCl = 83.2 ml/min   Goal of Therapy:  resolution of infection  Plan:  Expected duration 7 days with resolution of temperature and/or normalization of WBC   Ciprofloxacin 400 mg IV X 1 given on 8/7 . Levaquin 250 mg IV Q24H ordered to start 8/8 @ 18:00.   Craig [name] D 09/25/2015,9:02 PM

## 2015-09-25 NOTE — ED Triage Notes (Signed)
Pt to ED from [facility] with inability to fully urinate since 10am yesterday morning.  Pt also endorses dizziness and fever last night.  Pt remains dizzy this morning.  Afebrile in triage.

## 2015-09-25 NOTE — H&P (Signed)
[facility] [place] NAME: Craig [name]    MR#:  [id number]  DATE OF BIRTH:  09-27-1945  DATE OF ADMISSION:  09/25/2015  PRIMARY CARE PHYSICIAN: Tracie Harrier, MD   REQUESTING/REFERRING PHYSICIAN: Dr. Dineen Kid  CHIEF COMPLAINT:   Chief Complaint  Patient presents with  . Urinary Retention  . Dizziness    HISTORY OF PRESENT ILLNESS:  Craig [name]  is a 70 y.o. male with a known history of Essential hypertension, BPH, chronic left-sided hydronephrosis comes in because of fever, left flank pain, decreased urination since yesterday. Patient temperature 102 Fahrenheit. Also complained of headache yesterday associated with some nausea.  PAST MEDICAL HISTORY:   Past Medical History:  Diagnosis Date  . BPH (benign prostatic hyperplasia)   . Hypercholesterolemia   . Hypertension     PAST SURGICAL HISTOIRY:   Past Surgical History:  Procedure Laterality Date  . REPLACEMENT TOTAL KNEE Right     SOCIAL HISTORY:   Social History  Substance Use Topics  . Smoking status: Former Smoker    Quit date: 09/25/1983  . Smokeless tobacco: Never Used  . Alcohol use Not on file    FAMILY HISTORY:  No family history on file.  DRUG ALLERGIES:  No Known Allergies  REVIEW OF SYSTEMS:  CONSTITUTIONAL:, Fever, headache since yesterday  EYES: No blurred or double vision.  EARS, NOSE, AND THROAT: No tinnitus or ear pain.  RESPIRATORY: No cough, shortness of breath, wheezing or hemoptysis.  CARDIOVASCULAR: No chest pain, orthopnea, edema.  GASTROINTESTINAL: No nausea, vomiting, diarrhea or abdominal pain.  GENITOURINARY:Decreased urination, left flank pain since yesterday.  ENDOCRINE: No polyuria, nocturia,  HEMATOLOGY: No anemia, easy bruising or bleeding SKIN: No rash or lesion. MUSCULOSKELETAL: No joint pain or arthritis.   NEUROLOGIC: No tingling, numbness, weakness.  PSYCHIATRY: No anxiety or depression.   MEDICATIONS AT  HOME:   Prior to Admission medications   Medication Sig Start Date End Date Taking? Authorizing Provider  acetaminophen (TYLENOL 8 HOUR ARTHRITIS PAIN) 650 MG CR tablet Take 650 mg by mouth every 8 (eight) hours as needed for pain.   Yes Historical Provider, MD  cetirizine (ZYRTEC) 10 MG tablet Take 10 mg by mouth daily.   Yes Historical Provider, MD  docusate sodium (COLACE) 100 MG capsule Take 100 mg by mouth 2 (two) times daily.   Yes Historical Provider, MD  finasteride (PROSCAR) 5 MG tablet Take 5 mg by mouth daily.   Yes Historical Provider, MD  fluticasone (FLONASE) 50 MCG/ACT nasal spray Place 2 sprays into both nostrils daily.   Yes Historical Provider, MD  lisinopril (PRINIVIL,ZESTRIL) 30 MG tablet Take 30 mg by mouth daily.   Yes Historical Provider, MD  Multiple Vitamin (MULTIVITAMIN) tablet Take 1 tablet by mouth daily.   Yes Historical Provider, MD  omeprazole (PRILOSEC) 40 MG capsule Take 40 mg by mouth 2 (two) times daily.   Yes Historical Provider, MD  simvastatin (ZOCOR) 40 MG tablet Take 40 mg by mouth daily.   Yes Historical Provider, MD  tamsulosin (FLOMAX) 0.4 MG CAPS capsule Take 0.4 mg by mouth daily.   Yes Historical Provider, MD  traMADol (ULTRAM) 50 MG tablet Take 50 mg by mouth 3 (three) times daily.   Yes Historical Provider, MD  vitamin E 400 UNIT capsule Take 400 Units by mouth daily.   Yes Historical Provider, MD      VITAL SIGNS:  Blood pressure (!) 158/95, pulse 93, temperature 98.4 F (36.9 C),  temperature source Oral, resp. rate 18, height 5\' 11"  (1.803 m), weight 104.3 kg (230 lb), SpO2 98 %.  PHYSICAL EXAMINATION:  GENERAL:  70 y.o.-year-old patient lying in the bed with no acute distress.  EYES: Pupils equal, round, reactive to light and accommodation. No scleral icterus. Extraocular muscles intact.  HEENT: Head atraumatic, normocephalic. Oropharynx and nasopharynx clear.  NECK:  Supple, no jugular venous distention. No thyroid enlargement, no  tenderness.  LUNGS: Normal breath sounds bilaterally, no wheezing, rales,rhonchi or crepitation. No use of accessory muscles of respiration.  CARDIOVASCULAR: S1, S2 normal. No murmurs, rubs, or gallops.  ABDOMEN: Soft, nontender, nondistended. Bowel sounds present.Severe left flank pain with left CVA tenderness present. S: No pedal edema, cyanosis, or clubbing.  NEUROLOGIC: Cranial nerves II through XII are intact. Muscle strength 5/5 in all extremities. Sensation intact. Gait not checked.  PSYCHIATRIC: The patient is alert and oriented x 3.  SKIN: No obvious rash, lesion, or ulcer.   LABORATORY PANEL:   CBC  Recent Labs Lab 09/25/15 0949  WBC 14.6*  HGB 15.2  HCT 43.9  PLT 143*   ------------------------------------------------------------------------------------------------------------------  Chemistries   Recent Labs Lab 09/25/15 0949  NA 136  K 3.8  CL 104  CO2 23  GLUCOSE 155*  BUN 21*  CREATININE 1.03  CALCIUM 8.7*  AST 15  ALT 17  ALKPHOS 65  BILITOT 0.9   ------------------------------------------------------------------------------------------------------------------  Cardiac Enzymes No results for input(s): TROPONINI in the last 168 hours. ------------------------------------------------------------------------------------------------------------------  RADIOLOGY:  Ct Abdomen Pelvis W Contrast  Result Date: 09/25/2015 CLINICAL DATA:  70 year old male with urinary retention and chills since 1600 hours yesterday. Initial encounter. EXAM: CT ABDOMEN AND PELVIS WITH CONTRAST TECHNIQUE: Multidetector CT imaging of the abdomen and pelvis was performed using the standard protocol following bolus administration of intravenous contrast. CONTRAST:  167mL ISOVUE-300 IOPAMIDOL (ISOVUE-300) INJECTION 61% COMPARISON:  CT abdomen without and with contrast 08/16/2013. Report of nuclear medicine renal scan with Lasix 04/03/2001 (no images available). FINDINGS: Mild linear  atelectasis and respiratory motion artifact at the lung bases. No pericardial or pleural effusion. No acute osseous abnormality identified. There is a small volume of pelvic free fluid situated adjacent to distal small bowel loops. The urinary bladder is mildly to moderately distended, otherwise appears normal. See renal findings below. Negative rectum. Redundant sigmoid colon with mild diverticulosis, but no active inflammation. The left colon is negative aside from being displaced and compressed into the left lateral abdomen by the abnormal left kidney described below. Negative transverse colon. Oral contrast has reached the mid transverse colon. Redundant hepatic flexure. Negative right colon and appendix. Negative terminal ileum. No dilated small bowel. Small gastric hiatal has increased since 2015. Negative duodenum. No abdominal free air or free fluid. Negative liver, gallbladder, pancreas. Portal venous system is patent. Aortoiliac calcified atherosclerosis noted. Major arterial structures are patent. There is mild mass effect on the spleen and regional soft tissue stranding which emanates from the left kidney. Mild mass effect and stranding at the left adrenal gland as well. There is chronic severe left hydronephrosis and thinning of the left renal parenchyma. In 2015 the markedly enlarged left renal collecting system measured up to about 16.5 cm. Today the abnormal left kidney has further enlarged, with a massively dilated collecting system up to 19.3 cm . Also, the left kidney appears acutely inflamed with new inflammatory stranding throughout the left para renal space. The left kidney is also hypoenhancing relative to that on the right, but this is chronic to  an extent. The left ureter appears to be decompressed, and is difficult to identify at its junction with the massively dilated left renal pelvis. No urologic calculus or nephrocalcinosis. Mild, reactive appearing retroperitoneal lymph nodes on the  left. Secondary inflammation of the ventral surface of the left psoas muscle. There is also mild secondary stranding in the mesentery of the left splenic flexure. As expected on delayed renal images there is contrast excretion from the right kidney, but non on the left. IMPRESSION: 1. Chronic markedly dilated left kidney has further enlarged since 2015 (massively dilated) and is newly inflamed with confluent left pararenal stranding. Reactive left retroperitoneal lymphadenopathy and secondary inflammation of the adjacent left colon, spleen and psoas muscle. Suspect Acute Superinfection of the chronically abnormal kidney. Recommend Urology consultation. 2. Small volume of pelvic free fluid also felt secondary to #1. 3.  Calcified aortic atherosclerosis. Electronically Signed   By: Genevie Ann M.D.   On: 09/25/2015 15:56   Dg Chest Port 1 View  Result Date: 09/25/2015 CLINICAL DATA:  70 year old male with headache dizziness and Dysuria for 3 days. Initial encounter. Former smoker. EXAM: PORTABLE CHEST 1 VIEW COMPARISON:  Report of portable chest x-ray 01/12/2001 (no images available). FINDINGS: Portable AP upright view at 0955 hours. Cardiac size at the upper limits of normal. Other mediastinal contours are within normal limits. Visualized tracheal air column is within normal limits. No pneumothorax, pulmonary edema, pleural effusion or confluent opacity. Minimal atelectasis or scarring at the left lung base. IMPRESSION: No acute cardiopulmonary abnormality. Electronically Signed   By: Genevie Ann M.D.   On: 09/25/2015 10:21    EKG:   Orders placed or performed in visit on 02/10/12  . EKG 12-Lead    IMPRESSION AND PLAN:   1:  decreased urination, left flank pain concerning for acute pyelonephritis. UA looks okay but symptoms are concerning for infection, started on IV antibiotics, spoke with urology Dr. Erlene Quan she recommends fluoroquinolones.pt has  chronic left-sided hydronephrosis based on discussion with Dr.  Erlene Quan. Patient needs the IV antibiotics. Hemodynamically stable. Patient clinically stable so we are going to start IV antibiotics IV pain medication and IV hydration see how he does. And monitor urine output and Foley catheter.   , are reviewed and case discussed with ED provider. Management plans discussed with the patient, family and they are in agreement.  CODE STATUS: full  TOTAL TIME TAKING CARE OF THIS PATIENT: 55 minutes.    Epifanio Lesches M.D on 09/25/2015 at 8:47 PM  Between 7am to 6pm - Pager - [phone]  After 6pm go to [email or web address] - password [email or web address] [facility]  Office  [phone]  CC: Primary care physician; Tracie Harrier, MD  Note: This dictation was prepared with Dragon dictation along with smaller phrase technology. Any transcriptional errors that result from this process are unintentional.

## 2015-09-26 ENCOUNTER — Inpatient Hospital Stay: Payer: Medicare Other

## 2015-09-26 DIAGNOSIS — N12 Tubulo-interstitial nephritis, not specified as acute or chronic: Secondary | ICD-10-CM

## 2015-09-26 DIAGNOSIS — R51 Headache: Secondary | ICD-10-CM

## 2015-09-26 LAB — BASIC METABOLIC PANEL
Anion gap: 4 — ABNORMAL LOW (ref 5–15)
BUN: 17 mg/dL (ref 6–20)
CHLORIDE: 109 mmol/L (ref 101–111)
CO2: 24 mmol/L (ref 22–32)
Calcium: 7.6 mg/dL — ABNORMAL LOW (ref 8.9–10.3)
Creatinine, Ser: 1.03 mg/dL (ref 0.61–1.24)
Glucose, Bld: 98 mg/dL (ref 65–99)
POTASSIUM: 3.7 mmol/L (ref 3.5–5.1)
SODIUM: 137 mmol/L (ref 135–145)

## 2015-09-26 LAB — CBC
HEMATOCRIT: 38.1 % — AB (ref 40.0–52.0)
Hemoglobin: 13.1 g/dL (ref 13.0–18.0)
MCH: 31.8 pg (ref 26.0–34.0)
MCHC: 34.5 g/dL (ref 32.0–36.0)
MCV: 92.2 fL (ref 80.0–100.0)
PLATELETS: 118 10*3/uL — AB (ref 150–440)
RBC: 4.14 MIL/uL — AB (ref 4.40–5.90)
RDW: 14.2 % (ref 11.5–14.5)
WBC: 11.7 10*3/uL — AB (ref 3.8–10.6)

## 2015-09-26 LAB — GLUCOSE, CAPILLARY: Glucose-Capillary: 94 mg/dL (ref 65–99)

## 2015-09-26 MED ORDER — ENOXAPARIN SODIUM 40 MG/0.4ML ~~LOC~~ SOLN
40.0000 mg | SUBCUTANEOUS | Status: DC
  Administered 2015-09-26 – 2015-09-27 (×2): 40 mg via SUBCUTANEOUS
  Filled 2015-09-26 (×2): qty 0.4

## 2015-09-26 MED ORDER — SENNA 8.6 MG PO TABS
2.0000 | ORAL_TABLET | Freq: Every day | ORAL | Status: DC
  Administered 2015-09-26 – 2015-09-27 (×2): 17.2 mg via ORAL
  Filled 2015-09-26 (×2): qty 2

## 2015-09-26 NOTE — Progress Notes (Addendum)
Notified Dr. Vianne Bulls that patient has been having nose bleeds at home and that Kidney Cancer runs in his family. Per MD place order for CT of the head

## 2015-09-26 NOTE — Consult Note (Signed)
Urology Consult  I have been asked to see the patient by Dr. Vianne Bulls, for evaluation and management of left hydronephrosis.  Chief Complaint: fevers, left flank pain  History of Present Illness: Craig [name] is a 70 y.o. year old with a chronic left UPJ obstruction who presented to the emergency room yesterday evening with fevers and difficulty voiding.  Patient reports that the past few days, he has been feeling extremely fatigued, malaise, headache and had significant urinary symptoms including urgency, frequency, dysuria, and sensation of inability to urinate.  He denies any nausea or vomiting. He denies any worsening of his chronic left low back/flank pain. No gross hematuria.  He does have a personal history of BPH and postoperative urinary retention. He has previously seen Dr. Bernardo Heater for this and is currently taking finasteride and Flomax. Upon catheter placement in the ER, he only had 200 cc in his bladder. He is feeling significantly relieved with the catheter in place.  CT scan showed severe left hydroureteronephrosis with parenchymal thinning with a markedly enlarged left collecting system which is increased by 3 cm to 19 cm from 2 years ago. There is also some evidence of perinephric inflammation.  His creatinine is stable and his baseline. Although he has had fevers at home, he has been afebrile with only a low-grade temp to 99.9 here. He is normotensive and non-tachycardic. His white count has improved from 14.6-11.7 since admission. UA unremarkable.    He is feeling much better today after starting abx.    Past Medical History:  Diagnosis Date  . BPH (benign prostatic hyperplasia)   . Hypercholesterolemia   . Hypertension     Past Surgical History:  Procedure Laterality Date  . REPLACEMENT TOTAL KNEE Right     Home Medications:  Current Meds  Medication Sig  . acetaminophen (TYLENOL 8 HOUR ARTHRITIS PAIN) 650 MG CR tablet Take 650 mg by mouth every 8  (eight) hours as needed for pain.  . cetirizine (ZYRTEC) 10 MG tablet Take 10 mg by mouth daily.  Marland Kitchen docusate sodium (COLACE) 100 MG capsule Take 100 mg by mouth 2 (two) times daily.  . finasteride (PROSCAR) 5 MG tablet Take 5 mg by mouth daily.  . fluticasone (FLONASE) 50 MCG/ACT nasal spray Place 2 sprays into both nostrils daily.  Marland Kitchen lisinopril (PRINIVIL,ZESTRIL) 30 MG tablet Take 30 mg by mouth daily.  . Multiple Vitamin (MULTIVITAMIN) tablet Take 1 tablet by mouth daily.  Marland Kitchen omeprazole (PRILOSEC) 40 MG capsule Take 40 mg by mouth 2 (two) times daily.  . simvastatin (ZOCOR) 40 MG tablet Take 40 mg by mouth daily.  . tamsulosin (FLOMAX) 0.4 MG CAPS capsule Take 0.4 mg by mouth daily.  . traMADol (ULTRAM) 50 MG tablet Take 50 mg by mouth 3 (three) times daily.  . vitamin E 400 UNIT capsule Take 400 Units by mouth daily.    Allergies: No Known Allergies  History reviewed. No pertinent family history.  Social History:  reports that he quit smoking about 32 years ago. He has never used smokeless tobacco. His alcohol and drug histories are not on file.  ROS: A complete review of systems was performed.  All systems are negative except for pertinent findings as noted.  Physical Exam:  Vital signs in last 24 hours: Temp:  [98.4 F (36.9 C)-99.9 F (37.7 C)] 99 F (37.2 C) (08/08 0509) Pulse Rate:  [78-98] 86 (08/08 0509) Resp:  [14-20] 20 (08/07 2342) BP: (127-171)/(70-99) 141/72 (08/08 0509) SpO2:  [  92 %-100 %] 92 % (08/08 0509) Weight:  [218 lb 3.2 oz (99 kg)-230 lb (104.3 kg)] 218 lb 3.2 oz (99 kg) (08/07 2208) Constitutional:  Alert and oriented, No acute distress HEENT: [place] AT, moist mucus membranes.  Trachea midline, no masses Cardiovascular: Regular rate and rhythm, no clubbing, cyanosis, or edema. Respiratory: Normal respiratory effort, lungs clear bilaterally GI: Abdomen is soft, nontender, nondistended, no abdominal masses GU: No CVA tenderness.  Fullness in the left flank  palpable. Circumcised phallus with Foley catheter in place during clear yellow urine. Skin: No rashes, bruises or suspicious lesions Lymph: No cervical or inguinal adenopathy Neurologic: Grossly intact, no focal deficits, moving all 4 extremities Psychiatric: Normal mood and affect   Laboratory Data:   Recent Labs  09/25/15 0949 09/26/15 0444  WBC 14.6* 11.7*  HGB 15.2 13.1  HCT 43.9 38.1*    Recent Labs  09/25/15 0949 09/26/15 0444  NA 136 137  K 3.8 3.7  CL 104 109  CO2 23 24  GLUCOSE 155* 98  BUN 21* 17  CREATININE 1.03 1.03  CALCIUM 8.7* 7.6*    Radiologic Imaging: Ct Abdomen Pelvis W Contrast  Result Date: 09/25/2015 CLINICAL DATA:  70 year old male with urinary retention and chills since 1600 hours yesterday. Initial encounter. EXAM: CT ABDOMEN AND PELVIS WITH CONTRAST TECHNIQUE: Multidetector CT imaging of the abdomen and pelvis was performed using the standard protocol following bolus administration of intravenous contrast. CONTRAST:  159mL ISOVUE-300 IOPAMIDOL (ISOVUE-300) INJECTION 61% COMPARISON:  CT abdomen without and with contrast 08/16/2013. Report of nuclear medicine renal scan with Lasix 04/03/2001 (no images available). FINDINGS: Mild linear atelectasis and respiratory motion artifact at the lung bases. No pericardial or pleural effusion. No acute osseous abnormality identified. There is a small volume of pelvic free fluid situated adjacent to distal small bowel loops. The urinary bladder is mildly to moderately distended, otherwise appears normal. See renal findings below. Negative rectum. Redundant sigmoid colon with mild diverticulosis, but no active inflammation. The left colon is negative aside from being displaced and compressed into the left lateral abdomen by the abnormal left kidney described below. Negative transverse colon. Oral contrast has reached the mid transverse colon. Redundant hepatic flexure. Negative right colon and appendix. Negative terminal  ileum. No dilated small bowel. Small gastric hiatal has increased since 2015. Negative duodenum. No abdominal free air or free fluid. Negative liver, gallbladder, pancreas. Portal venous system is patent. Aortoiliac calcified atherosclerosis noted. Major arterial structures are patent. There is mild mass effect on the spleen and regional soft tissue stranding which emanates from the left kidney. Mild mass effect and stranding at the left adrenal gland as well. There is chronic severe left hydronephrosis and thinning of the left renal parenchyma. In 2015 the markedly enlarged left renal collecting system measured up to about 16.5 cm. Today the abnormal left kidney has further enlarged, with a massively dilated collecting system up to 19.3 cm . Also, the left kidney appears acutely inflamed with new inflammatory stranding throughout the left para renal space. The left kidney is also hypoenhancing relative to that on the right, but this is chronic to an extent. The left ureter appears to be decompressed, and is difficult to identify at its junction with the massively dilated left renal pelvis. No urologic calculus or nephrocalcinosis. Mild, reactive appearing retroperitoneal lymph nodes on the left. Secondary inflammation of the ventral surface of the left psoas muscle. There is also mild secondary stranding in the mesentery of the left splenic flexure.  As expected on delayed renal images there is contrast excretion from the right kidney, but non on the left. IMPRESSION: 1. Chronic markedly dilated left kidney has further enlarged since 2015 (massively dilated) and is newly inflamed with confluent left pararenal stranding. Reactive left retroperitoneal lymphadenopathy and secondary inflammation of the adjacent left colon, spleen and psoas muscle. Suspect Acute Superinfection of the chronically abnormal kidney. Recommend Urology consultation. 2. Small volume of pelvic free fluid also felt secondary to #1. 3.  Calcified  aortic atherosclerosis. Electronically Signed   By: Genevie Ann M.D.   On: 09/25/2015 15:56   Dg Chest Port 1 View  Result Date: 09/25/2015 CLINICAL DATA:  70 year old male with headache dizziness and Dysuria for 3 days. Initial encounter. Former smoker. EXAM: PORTABLE CHEST 1 VIEW COMPARISON:  Report of portable chest x-ray 01/12/2001 (no images available). FINDINGS: Portable AP upright view at 0955 hours. Cardiac size at the upper limits of normal. Other mediastinal contours are within normal limits. Visualized tracheal air column is within normal limits. No pneumothorax, pulmonary edema, pleural effusion or confluent opacity. Minimal atelectasis or scarring at the left lung base. IMPRESSION: No acute cardiopulmonary abnormality. Electronically Signed   By: Genevie Ann M.D.   On: 09/25/2015 10:21   CT scan personally reviewed today. In addition, previous studies were also reviewed for comparison.  Impression/ Plan:  70 year old male with chronic left UPJ obstruction, massive dilation of the kidney with very little residual parenchyma presenting with fevers, leukocytosis but otherwise hemodynamically stable. UA is not suspicious for infection although its possible that he may have some inflammation/underlying infection of the left obstructed collecting system.  In addition, he has urinary complaints today although his UAs out negative. He may have a component of prostatitis as well. Post-voided residual was minimal at the time of catheter placement.  At this point, I would recommend continuation of IV antibiotics in the form of fluoroquinolone for optimal penetration of the collecting system.  Given that the patient is currently afebrile, normotensive, and non-tachycardic, I would hold off on proceeding with ureteral stent placement or percutaneous nephrostomy tube for source control as it would be very difficult to drain this massively dilated system adequately with either modality and he seems to be  clinically improving on abx alone.    1) Chronic left UPJ obstruction possible source of malaise and fevers. Continue IV Levaquin and transitioned to by mouth tomorrow. Follow-up urine cultures. Repeat blood cultures if he spikes a fever and consider percutaneous nephrostomy tube drainage if he decompensates. Please make urology aware of this ASAP if this is the case.  I had a lengthy discussion with the patient that given his chronic flank pain and infection, I do feel that down the road, he would be best served by a left nephrectomy. I would like to see him in follow-up once his infection is adequately treated to discuss this further or he can follow up with his previous urologist.  If remains stable, OK to d/c home tomomorrow  2) Urinary frequency/difficulty voiding- given low post void residual, I do not feel that he was truly in urinary retention. He may have a component of prostatitis which will be treated with abx.  D/c Foley in AM and check PVR with bladder scan  3) BPH Continue flomax and finasteride  09/26/2015, 8:01 AM  Hollice Espy,  MD

## 2015-09-26 NOTE — Progress Notes (Signed)
[facility] [place] NAME: Craig [name]    MR#:  [id number]  DATE OF BIRTH:  1945-06-28  SUBJECTIVE: admitted  yesterday for left flank pain, possible pyelonephritis. Patient  Says  He feels better today. Having clear yellow urine through the Foley. Has some headache.   CHIEF COMPLAINT:   Chief Complaint  Patient presents with  . Urinary Retention  . Dizziness    REVIEW OF SYSTEMS:   ROS CONSTITUTIONAL: No fever, fatigue or weakness.  EYES: No blurred or double vision.  EARS, NOSE, AND THROAT: No tinnitus or ear pain.  RESPIRATORY: No cough, shortness of breath, wheezing or hemoptysis.  CARDIOVASCULAR: No chest pain, orthopnea, edema.  GASTROINTESTINAL: No nausea, vomiting, diarrhea or abdominal pain.  GENITOURINARY: No dysuria, hematuria.  ENDOCRINE: No polyuria, nocturia,  HEMATOLOGY: No anemia, easy bruising or bleeding SKIN: No rash or lesion. MUSCULOSKELETAL: No joint pain or arthritis.   NEUROLOGIC: No tingling, numbness, weakness.  PSYCHIATRY: No anxiety or depression.   DRUG ALLERGIES:  No Known Allergies  VITALS:  Blood pressure (!) 141/72, pulse 86, temperature 99 F (37.2 C), temperature source Oral, resp. rate 20, height 5\' 11"  (1.803 m), weight 99 kg (218 lb 3.2 oz), SpO2 92 %.  PHYSICAL EXAMINATION:  GENERAL:  70 y.o.-year-old patient lying in the bed with no acute distress.  EYES: Pupils equal, round, reactive to light and accommodation. No scleral icterus. Extraocular muscles intact.  HEENT: Head atraumatic, normocephalic. Oropharynx and nasopharynx clear.  NECK:  Supple, no jugular venous distention. No thyroid enlargement, no tenderness.  LUNGS: Normal breath sounds bilaterally, no wheezing, rales,rhonchi or crepitation. No use of accessory muscles of respiration.  CARDIOVASCULAR: S1, S2 normal. No murmurs, rubs, or gallops.  ABDOMEN: Soft, nontender, nondistended. Bowel sounds present. No organomegaly  or mass.  EXTREMITIES: No pedal edema, cyanosis, or clubbing.  NEUROLOGIC: Cranial nerves II through XII are intact. Muscle strength 5/5 in all extremities. Sensation intact. Gait not checked.  PSYCHIATRIC: The patient is alert and oriented x 3.  SKIN: No obvious rash, lesion, or ulcer.    LABORATORY PANEL:   CBC  Recent Labs Lab 09/26/15 0444  WBC 11.7*  HGB 13.1  HCT 38.1*  PLT 118*   ------------------------------------------------------------------------------------------------------------------  Chemistries   Recent Labs Lab 09/25/15 0949 09/26/15 0444  NA 136 137  K 3.8 3.7  CL 104 109  CO2 23 24  GLUCOSE 155* 98  BUN 21* 17  CREATININE 1.03 1.03  CALCIUM 8.7* 7.6*  AST 15  --   ALT 17  --   ALKPHOS 65  --   BILITOT 0.9  --    ------------------------------------------------------------------------------------------------------------------  Cardiac Enzymes No results for input(s): TROPONINI in the last 168 hours. ------------------------------------------------------------------------------------------------------------------  RADIOLOGY:  Ct Abdomen Pelvis W Contrast  Result Date: 09/25/2015 CLINICAL DATA:  70 year old male with urinary retention and chills since 1600 hours yesterday. Initial encounter. EXAM: CT ABDOMEN AND PELVIS WITH CONTRAST TECHNIQUE: Multidetector CT imaging of the abdomen and pelvis was performed using the standard protocol following bolus administration of intravenous contrast. CONTRAST:  154mL ISOVUE-300 IOPAMIDOL (ISOVUE-300) INJECTION 61% COMPARISON:  CT abdomen without and with contrast 08/16/2013. Report of nuclear medicine renal scan with Lasix 04/03/2001 (no images available). FINDINGS: Mild linear atelectasis and respiratory motion artifact at the lung bases. No pericardial or pleural effusion. No acute osseous abnormality identified. There is a small volume of pelvic free fluid situated adjacent to distal small bowel loops. The  urinary bladder  is mildly to moderately distended, otherwise appears normal. See renal findings below. Negative rectum. Redundant sigmoid colon with mild diverticulosis, but no active inflammation. The left colon is negative aside from being displaced and compressed into the left lateral abdomen by the abnormal left kidney described below. Negative transverse colon. Oral contrast has reached the mid transverse colon. Redundant hepatic flexure. Negative right colon and appendix. Negative terminal ileum. No dilated small bowel. Small gastric hiatal has increased since 2015. Negative duodenum. No abdominal free air or free fluid. Negative liver, gallbladder, pancreas. Portal venous system is patent. Aortoiliac calcified atherosclerosis noted. Major arterial structures are patent. There is mild mass effect on the spleen and regional soft tissue stranding which emanates from the left kidney. Mild mass effect and stranding at the left adrenal gland as well. There is chronic severe left hydronephrosis and thinning of the left renal parenchyma. In 2015 the markedly enlarged left renal collecting system measured up to about 16.5 cm. Today the abnormal left kidney has further enlarged, with a massively dilated collecting system up to 19.3 cm . Also, the left kidney appears acutely inflamed with new inflammatory stranding throughout the left para renal space. The left kidney is also hypoenhancing relative to that on the right, but this is chronic to an extent. The left ureter appears to be decompressed, and is difficult to identify at its junction with the massively dilated left renal pelvis. No urologic calculus or nephrocalcinosis. Mild, reactive appearing retroperitoneal lymph nodes on the left. Secondary inflammation of the ventral surface of the left psoas muscle. There is also mild secondary stranding in the mesentery of the left splenic flexure. As expected on delayed renal images there is contrast excretion from the  right kidney, but non on the left. IMPRESSION: 1. Chronic markedly dilated left kidney has further enlarged since 2015 (massively dilated) and is newly inflamed with confluent left pararenal stranding. Reactive left retroperitoneal lymphadenopathy and secondary inflammation of the adjacent left colon, spleen and psoas muscle. Suspect Acute Superinfection of the chronically abnormal kidney. Recommend Urology consultation. 2. Small volume of pelvic free fluid also felt secondary to #1. 3.  Calcified aortic atherosclerosis. Electronically Signed   By: Genevie Ann M.D.   On: 09/25/2015 15:56   Dg Chest Port 1 View  Result Date: 09/25/2015 CLINICAL DATA:  69 year old male with headache dizziness and Dysuria for 3 days. Initial encounter. Former smoker. EXAM: PORTABLE CHEST 1 VIEW COMPARISON:  Report of portable chest x-ray 01/12/2001 (no images available). FINDINGS: Portable AP upright view at 0955 hours. Cardiac size at the upper limits of normal. Other mediastinal contours are within normal limits. Visualized tracheal air column is within normal limits. No pneumothorax, pulmonary edema, pleural effusion or confluent opacity. Minimal atelectasis or scarring at the left lung base. IMPRESSION: No acute cardiopulmonary abnormality. Electronically Signed   By: Genevie Ann M.D.   On: 09/25/2015 10:21    EKG:   Orders placed or performed in visit on 02/10/12  . EKG 12-Lead    ASSESSMENT AND PLAN:  1. Left-sided pyelonephritis with enlarged left kidney: On IV Levaquin, IV fluids, IV pain medication. Continue to monitor urine output with Foley catheter. Seen by urology Dr. Hollice Espy. Patient had trouble urinating at home associated with left flank pain. Patient has chronic left-sided  Kidney enlargement. #2 BPH continue Flomax. #  DC Foley tomorrow and, voiding trial tomorrow. Likely discharge  If he pass voiding trialafter that.  Discussed with patient, patient's registered nurse, patient's wife  All the  records are reviewed and case discussed with Care Management/Social Workerr. Management plans discussed with the patient, family and they are in agreement.  CODE STATUS: full  TOTAL TIME TAKING CARE OF THIS PATIENT: 35 minutes.   POSSIBLE D/C IN 1DAYS, DEPENDING ON CLINICAL CONDITION.   Epifanio Lesches M.D on 09/26/2015 at 10:30 AM  Between 7am to 6pm - Pager - [phone]  After 6pm go to [email or web address] - password [email or web address] [facility]  Office  [phone]  CC: Primary care physician; Tracie Harrier, MD   Note: This dictation was prepared with Dragon dictation along with smaller phrase technology. Any transcriptional errors that result from this process are unintentional.

## 2015-09-27 LAB — URINE CULTURE

## 2015-09-27 LAB — GLUCOSE, CAPILLARY: GLUCOSE-CAPILLARY: 92 mg/dL (ref 65–99)

## 2015-09-27 MED ORDER — PNEUMOCOCCAL VAC POLYVALENT 25 MCG/0.5ML IJ INJ: 0.5000 mL | INJECTION | INTRAMUSCULAR | Status: DC

## 2015-09-27 NOTE — Progress Notes (Signed)
Urology Consult Follow Up  Subjective: Complaining of headaches status post negative head CT yesterday. Low-grade temp overnight. Otherwise hemodynamically stable. Complains of left flank pain which is new since yesterday.  Anti-infectives: Anti-infectives    Start     Dose/Rate Route Frequency Ordered Stop   09/26/15 1800  Levofloxacin (LEVAQUIN) IVPB 250 mg     250 mg 50 mL/hr over 60 Minutes Intravenous Every 24 hours 09/25/15 2102     09/25/15 1945  ciprofloxacin (CIPRO) IVPB 400 mg     400 mg 200 mL/hr over 60 Minutes Intravenous  Once 09/25/15 1944 09/25/15 2131      Current Facility-Administered Medications  Medication Dose Route Frequency Provider Last Rate Last Dose  . 0.9 %  sodium chloride infusion   Intravenous Continuous Epifanio Lesches, MD 50 mL/hr at 09/26/15 1707    . acetaminophen (TYLENOL) tablet 650 mg  650 mg Oral Q6H PRN Epifanio Lesches, MD       Or  . acetaminophen (TYLENOL) suppository 650 mg  650 mg Rectal Q6H PRN Epifanio Lesches, MD      . bisacodyl (DULCOLAX) EC tablet 5 mg  5 mg Oral Daily PRN Epifanio Lesches, MD      . docusate sodium (COLACE) capsule 100 mg  100 mg Oral BID Epifanio Lesches, MD   100 mg at 09/27/15 0914  . enoxaparin (LOVENOX) injection 40 mg  40 mg Subcutaneous Q24H Epifanio Lesches, MD   40 mg at 09/26/15 2137  . finasteride (PROSCAR) tablet 5 mg  5 mg Oral Daily Epifanio Lesches, MD   5 mg at 09/27/15 0913  . fluticasone (FLONASE) 50 MCG/ACT nasal spray 2 spray  2 spray Each Nare Daily Epifanio Lesches, MD   2 spray at 09/27/15 0915  . HYDROcodone-acetaminophen (NORCO/VICODIN) 5-325 MG per tablet 1-2 tablet  1-2 tablet Oral Q4H PRN Epifanio Lesches, MD   2 tablet at 09/27/15 0913  . Levofloxacin (LEVAQUIN) IVPB 250 mg  250 mg Intravenous Q24H Epifanio Lesches, MD   250 mg at 09/26/15 1708  . ondansetron (ZOFRAN) tablet 4 mg  4 mg Oral Q6H PRN Epifanio Lesches, MD       Or  . ondansetron (ZOFRAN)  injection 4 mg  4 mg Intravenous Q6H PRN Epifanio Lesches, MD      . pantoprazole (PROTONIX) EC tablet 40 mg  40 mg Oral Daily Epifanio Lesches, MD   40 mg at 09/27/15 0915  . phenazopyridine (PYRIDIUM) tablet 100 mg  100 mg Oral TID WC Epifanio Lesches, MD   100 mg at 09/27/15 0915  . [START ON 09/28/2015] pneumococcal 23 valent vaccine (PNU-IMMUNE) injection 0.5 mL  0.5 mL Intramuscular Tomorrow-1000 Epifanio Lesches, MD      . senna (SENOKOT) tablet 17.2 mg  2 tablet Oral QHS Epifanio Lesches, MD   17.2 mg at 09/26/15 2137  . simvastatin (ZOCOR) tablet 40 mg  40 mg Oral Daily Epifanio Lesches, MD   40 mg at 09/27/15 0914  . tamsulosin (FLOMAX) capsule 0.4 mg  0.4 mg Oral Daily Epifanio Lesches, MD   0.4 mg at 09/27/15 0915  . traMADol (ULTRAM) tablet 50 mg  50 mg Oral TID Epifanio Lesches, MD   50 mg at 09/27/15 0914  . traZODone (DESYREL) tablet 25 mg  25 mg Oral QHS PRN Epifanio Lesches, MD      . vitamin E capsule 400 Units  400 Units Oral Daily Epifanio Lesches, MD   400 Units at 09/27/15 0914     Objective: Vital  signs in last 24 hours: Temp:  [98.6 F (37 C)-100.7 F (38.2 C)] 98.6 F (37 C) (08/09 1220) Pulse Rate:  [80-91] 82 (08/09 1220) Resp:  [17-20] 18 (08/09 1220) BP: (125-162)/(69-90) 162/90 (08/09 1220) SpO2:  [97 %-100 %] 99 % (08/09 1220) Weight:  [231 lb 11.2 oz (105.1 kg)] 231 lb 11.2 oz (105.1 kg) (08/09 0421)  Intake/Output from previous day: 08/08 0701 - 08/09 0700 In: [id number] [P.O.:600; I.V.:1163] Out: 950 [Urine:950] Intake/Output this shift: Total I/O In: 178 [I.V.:178] Out: 0    Physical Exam  Constitutional: He is oriented to person, place, and time and well-developed, well-nourished, and in no distress.  HENT:  Head: Normocephalic and atraumatic.  Abdominal: Soft.  Genitourinary:  Genitourinary Comments: Foley draining construct-appearing urine.  Left flank fullness appreciated.  Musculoskeletal: Normal range of  motion.  Neurological: He is alert and oriented to person, place, and time.  Skin: Skin is warm and dry.    Lab Results:   Recent Labs  09/25/15 0949 09/26/15 0444  WBC 14.6* 11.7*  HGB 15.2 13.1  HCT 43.9 38.1*  PLT 143* 118*   BMET  Recent Labs  09/25/15 0949 09/26/15 0444  NA 136 137  K 3.8 3.7  CL 104 109  CO2 23 24  GLUCOSE 155* 98  BUN 21* 17  CREATININE 1.03 1.03  CALCIUM 8.7* 7.6*    Studies/Results: Ct Head Wo Contrast  Result Date: 09/26/2015 CLINICAL DATA:  Frequent headaches. EXAM: CT HEAD WITHOUT CONTRAST TECHNIQUE: Contiguous axial images were obtained from the base of the skull through the vertex without intravenous contrast. COMPARISON:  None. FINDINGS: Bony calvarium appears intact. Mild chronic ischemic white matter disease is noted. No mass effect or midline shift is noted. Ventricular size is within normal limits. There is no evidence of mass lesion, hemorrhage or acute infarction. IMPRESSION: Mild chronic ischemic white matter disease. No acute intracranial abnormality seen. Electronically Signed   By: Marijo Conception, M.D.   On: 09/26/2015 16:17   Ct Abdomen Pelvis W Contrast  Result Date: 09/25/2015 CLINICAL DATA:  70 year old male with urinary retention and chills since 1600 hours yesterday. Initial encounter. EXAM: CT ABDOMEN AND PELVIS WITH CONTRAST TECHNIQUE: Multidetector CT imaging of the abdomen and pelvis was performed using the standard protocol following bolus administration of intravenous contrast. CONTRAST:  147mL ISOVUE-300 IOPAMIDOL (ISOVUE-300) INJECTION 61% COMPARISON:  CT abdomen without and with contrast 08/16/2013. Report of nuclear medicine renal scan with Lasix 04/03/2001 (no images available). FINDINGS: Mild linear atelectasis and respiratory motion artifact at the lung bases. No pericardial or pleural effusion. No acute osseous abnormality identified. There is a small volume of pelvic free fluid situated adjacent to distal small bowel  loops. The urinary bladder is mildly to moderately distended, otherwise appears normal. See renal findings below. Negative rectum. Redundant sigmoid colon with mild diverticulosis, but no active inflammation. The left colon is negative aside from being displaced and compressed into the left lateral abdomen by the abnormal left kidney described below. Negative transverse colon. Oral contrast has reached the mid transverse colon. Redundant hepatic flexure. Negative right colon and appendix. Negative terminal ileum. No dilated small bowel. Small gastric hiatal has increased since 2015. Negative duodenum. No abdominal free air or free fluid. Negative liver, gallbladder, pancreas. Portal venous system is patent. Aortoiliac calcified atherosclerosis noted. Major arterial structures are patent. There is mild mass effect on the spleen and regional soft tissue stranding which emanates from the left kidney. Mild mass effect and stranding  at the left adrenal gland as well. There is chronic severe left hydronephrosis and thinning of the left renal parenchyma. In 2015 the markedly enlarged left renal collecting system measured up to about 16.5 cm. Today the abnormal left kidney has further enlarged, with a massively dilated collecting system up to 19.3 cm . Also, the left kidney appears acutely inflamed with new inflammatory stranding throughout the left para renal space. The left kidney is also hypoenhancing relative to that on the right, but this is chronic to an extent. The left ureter appears to be decompressed, and is difficult to identify at its junction with the massively dilated left renal pelvis. No urologic calculus or nephrocalcinosis. Mild, reactive appearing retroperitoneal lymph nodes on the left. Secondary inflammation of the ventral surface of the left psoas muscle. There is also mild secondary stranding in the mesentery of the left splenic flexure. As expected on delayed renal images there is contrast excretion  from the right kidney, but non on the left. IMPRESSION: 1. Chronic markedly dilated left kidney has further enlarged since 2015 (massively dilated) and is newly inflamed with confluent left pararenal stranding. Reactive left retroperitoneal lymphadenopathy and secondary inflammation of the adjacent left colon, spleen and psoas muscle. Suspect Acute Superinfection of the chronically abnormal kidney. Recommend Urology consultation. 2. Small volume of pelvic free fluid also felt secondary to #1. 3.  Calcified aortic atherosclerosis. Electronically Signed   By: Genevie Ann M.D.   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 Continue IV Levaquin and transitioned to by mouth.  If he spikes a high fever and consider percutaneous nephrostomy tube drainage if he decompensates. Please make urology aware of this ASAP if this is the case.  Ucx growing enterococcus, sensitive to levaquin.  Will need nephrectomy as outpatient after infection adequately treated   2) Urinary frequency/difficulty voiding- D/c Foley today and check PVR with bladder scan  3) BPH Continue flomax and finasteride    LOS: 2 days    Hollice Espy 09/27/2015

## 2015-09-27 NOTE — Care Management Important Message (Signed)
Important Message  Patient Details  Name: Craig [name] MRN: [id number] Date of Birth: [birth year]   Medicare Important Message Given:  Yes    Beverly Sessions, RN 09/27/2015, 12:02 PM

## 2015-09-27 NOTE — Progress Notes (Signed)
[facility] [place] NAME: Craig [name]    MR#:  [id number]  DATE OF BIRTH:  07-29-1945  SUBJECTIVE;feels better.less flank pain.still low grade temp.  CHIEF COMPLAINT:   Chief Complaint  Patient presents with  . Urinary Retention  . Dizziness    REVIEW OF SYSTEMS:   ROS CONSTITUTIONAL: No fever, fatigue or weakness.  EYES: No blurred or double vision.  EARS, NOSE, AND THROAT: No tinnitus or ear pain.  RESPIRATORY: No cough, shortness of breath, wheezing or hemoptysis.  CARDIOVASCULAR: No chest pain, orthopnea, edema.  GASTROINTESTINAL: No nausea, vomiting, diarrhea or abdominal pain.  GENITOURINARY: No dysuria, hematuria.  ENDOCRINE: No polyuria, nocturia,  HEMATOLOGY: No anemia, easy bruising or bleeding SKIN: No rash or lesion. MUSCULOSKELETAL: No joint pain or arthritis.   NEUROLOGIC: No tingling, numbness, weakness.  PSYCHIATRY: No anxiety or depression.   DRUG ALLERGIES:  No Known Allergies  VITALS:  Blood pressure (!) 162/90, pulse 82, temperature 98.6 F (37 C), temperature source Oral, resp. rate 18, height 5\' 11"  (1.803 m), weight 105.1 kg (231 lb 11.2 oz), SpO2 99 %.  PHYSICAL EXAMINATION:  GENERAL:  70 y.o.-year-old patient lying in the bed with no acute distress.  EYES: Pupils equal, round, reactive to light and accommodation. No scleral icterus. Extraocular muscles intact.  HEENT: Head atraumatic, normocephalic. Oropharynx and nasopharynx clear.  NECK:  Supple, no jugular venous distention. No thyroid enlargement, no tenderness.  LUNGS: Normal breath sounds bilaterally, no wheezing, rales,rhonchi or crepitation. No use of accessory muscles of respiration.  CARDIOVASCULAR: S1, S2 normal. No murmurs, rubs, or gallops.  ABDOMEN: Soft, nontender, nondistended. Bowel sounds present. No organomegaly or mass. No flank tenderness EXTREMITIES: No pedal edema, cyanosis, or clubbing.  NEUROLOGIC: Cranial nerves II  through XII are intact. Muscle strength 5/5 in all extremities. Sensation intact. Gait not checked.  PSYCHIATRIC: The patient is alert and oriented x 3.  SKIN: No obvious rash, lesion, or ulcer.    LABORATORY PANEL:   CBC  Recent Labs Lab 09/26/15 0444  WBC 11.7*  HGB 13.1  HCT 38.1*  PLT 118*   ------------------------------------------------------------------------------------------------------------------  Chemistries   Recent Labs Lab 09/25/15 0949 09/26/15 0444  NA 136 137  K 3.8 3.7  CL 104 109  CO2 23 24  GLUCOSE 155* 98  BUN 21* 17  CREATININE 1.03 1.03  CALCIUM 8.7* 7.6*  AST 15  --   ALT 17  --   ALKPHOS 65  --   BILITOT 0.9  --    ------------------------------------------------------------------------------------------------------------------  Cardiac Enzymes No results for input(s): TROPONINI in the last 168 hours. ------------------------------------------------------------------------------------------------------------------  RADIOLOGY:  Ct Head Wo Contrast  Result Date: 09/26/2015 CLINICAL DATA:  Frequent headaches. EXAM: CT HEAD WITHOUT CONTRAST TECHNIQUE: Contiguous axial images were obtained from the base of the skull through the vertex without intravenous contrast. COMPARISON:  None. FINDINGS: Bony calvarium appears intact. Mild chronic ischemic white matter disease is noted. No mass effect or midline shift is noted. Ventricular size is within normal limits. There is no evidence of mass lesion, hemorrhage or acute infarction. IMPRESSION: Mild chronic ischemic white matter disease. No acute intracranial abnormality seen. Electronically Signed   By: Marijo Conception, M.D.   On: 09/26/2015 16:17    EKG:   Orders placed or performed in visit on 02/10/12  . EKG 12-Lead    ASSESSMENT AND PLAN:  1. Left-sided pyelonephritis with enlarged left kidney: On IV Levaquin, IV fluids, IV pain medication.  Continue to monitor urine output with Foley  catheter. Seen by urology Dr. Hollice Espy. Patient had trouble urinating at home associated with left flank pain. Patient has chronic left-sided  Kidney enlargement. Needs ; left nephrectomy as an  Out pt.urine cultures enterocci. #2 BPH continue Flomax. #  DC Foley\ today,check PVR likley d/c am   Discussed with patient, patient's registered nurse, patient's wife   All the records are reviewed and case discussed with Care Management/Social Workerr. Management plans discussed with the patient, family and they are in agreement.  CODE STATUS: full  TOTAL TIME TAKING CARE OF THIS PATIENT: 35 minutes.   POSSIBLE D/C IN 1DAYS, DEPENDING ON CLINICAL CONDITION.   Epifanio Lesches M.D on 09/27/2015 at 7:32 PM  Between 7am to 6pm - Pager - [phone]  After 6pm go to [email or web address] - password [email or web address] [facility]  Office  [phone]  CC: Primary care physician; Tracie Harrier, MD   Note: This dictation was prepared with Dragon dictation along with smaller phrase technology. Any transcriptional errors that result from this process are unintentional.

## 2015-09-28 ENCOUNTER — Telehealth: Payer: Self-pay | Admitting: Urology

## 2015-09-28 LAB — GLUCOSE, CAPILLARY: GLUCOSE-CAPILLARY: 118 mg/dL — AB (ref 65–99)

## 2015-09-28 MED ORDER — LABETALOL HCL 5 MG/ML IV SOLN
10.0000 mg | INTRAVENOUS | Status: DC | PRN
  Administered 2015-09-28: 20 mg via INTRAVENOUS
  Filled 2015-09-28 (×2): qty 4

## 2015-09-28 MED ORDER — HYDROCODONE-ACETAMINOPHEN 5-325 MG PO TABS
1.0000 | ORAL_TABLET | ORAL | Status: DC | PRN
  Administered 2015-09-28: 2 via ORAL
  Filled 2015-09-28: qty 2

## 2015-09-28 MED ORDER — LISINOPRIL 20 MG PO TABS
30.0000 mg | ORAL_TABLET | Freq: Every day | ORAL | Status: DC
  Administered 2015-09-28: 30 mg via ORAL
  Filled 2015-09-28: qty 1

## 2015-09-28 MED ORDER — LEVOFLOXACIN 250 MG PO TABS: 250.0000 mg | ORAL_TABLET | Freq: Every day | ORAL | 0 refills | Status: DC

## 2015-09-28 MED ORDER — AMOXICILLIN 500 MG PO CAPS
500.0000 mg | ORAL_CAPSULE | Freq: Three times a day (TID) | ORAL | Status: DC
  Administered 2015-09-28: 500 mg via ORAL
  Filled 2015-09-28: qty 1

## 2015-09-28 NOTE — Discharge Instructions (Signed)
Discharge home with foley Follow up with Dr.Brandon on one week

## 2015-09-28 NOTE — Progress Notes (Signed)
[facility] [place] NAME: Craig [name]    MR#:  [id number]  DATE OF BIRTH:  1946-02-03  SUBJECTIVE; dced foley,PVR more than 200 ml. so discharging with Foley, antibiotic Levaquin 250 MG daily for 14 days.   CHIEF COMPLAINT:   Chief Complaint  Patient presents with  . Urinary Retention  . Dizziness    REVIEW OF SYSTEMS:   ROS CONSTITUTIONAL: No fever, fatigue or weakness.  EYES: No blurred or double vision.  EARS, NOSE, AND THROAT: No tinnitus or ear pain.  RESPIRATORY: No cough, shortness of breath, wheezing or hemoptysis.  CARDIOVASCULAR: No chest pain, orthopnea, edema.  GASTROINTESTINAL: No nausea, vomiting, diarrhea or abdominal pain.  GENITOURINARY: No dysuria, hematuria.  ENDOCRINE: No polyuria, nocturia,  HEMATOLOGY: No anemia, easy bruising or bleeding SKIN: No rash or lesion. MUSCULOSKELETAL: No joint pain or arthritis.   NEUROLOGIC: No tingling, numbness, weakness.  PSYCHIATRY: No anxiety or depression.   DRUG ALLERGIES:  No Known Allergies  VITALS:  Blood pressure (!) 148/83, pulse 90, temperature 99.2 F (37.3 C), temperature source Oral, resp. rate 20, height 5\' 11"  (1.803 m), weight 104.6 kg (230 lb 8 oz), SpO2 99 %.  PHYSICAL EXAMINATION:  GENERAL:  70 y.o.-year-old patient lying in the bed with no acute distress.  EYES: Pupils equal, round, reactive to light and accommodation. No scleral icterus. Extraocular muscles intact.  HEENT: Head atraumatic, normocephalic. Oropharynx and nasopharynx clear.  NECK:  Supple, no jugular venous distention. No thyroid enlargement, no tenderness.  LUNGS: Normal breath sounds bilaterally, no wheezing, rales,rhonchi or crepitation. No use of accessory muscles of respiration.  CARDIOVASCULAR: S1, S2 normal. No murmurs, rubs, or gallops.  ABDOMEN: Soft, nontender, nondistended. Bowel sounds present. No organomegaly or mass. No flank tenderness EXTREMITIES: No pedal edema,  cyanosis, or clubbing.  NEUROLOGIC: Cranial nerves II through XII are intact. Muscle strength 5/5 in all extremities. Sensation intact. Gait not checked.  PSYCHIATRIC: The patient is alert and oriented x 3.  SKIN: No obvious rash, lesion, or ulcer.    LABORATORY PANEL:   CBC  Recent Labs Lab 09/26/15 0444  WBC 11.7*  HGB 13.1  HCT 38.1*  PLT 118*   ------------------------------------------------------------------------------------------------------------------  Chemistries   Recent Labs Lab 09/25/15 0949 09/26/15 0444  NA 136 137  K 3.8 3.7  CL 104 109  CO2 23 24  GLUCOSE 155* 98  BUN 21* 17  CREATININE 1.03 1.03  CALCIUM 8.7* 7.6*  AST 15  --   ALT 17  --   ALKPHOS 65  --   BILITOT 0.9  --    ------------------------------------------------------------------------------------------------------------------  Cardiac Enzymes No results for input(s): TROPONINI in the last 168 hours. ------------------------------------------------------------------------------------------------------------------  RADIOLOGY:  Ct Head Wo Contrast  Result Date: 09/26/2015 CLINICAL DATA:  Frequent headaches. EXAM: CT HEAD WITHOUT CONTRAST TECHNIQUE: Contiguous axial images were obtained from the base of the skull through the vertex without intravenous contrast. COMPARISON:  None. FINDINGS: Bony calvarium appears intact. Mild chronic ischemic white matter disease is noted. No mass effect or midline shift is noted. Ventricular size is within normal limits. There is no evidence of mass lesion, hemorrhage or acute infarction. IMPRESSION: Mild chronic ischemic white matter disease. No acute intracranial abnormality seen. Electronically Signed   By: Marijo Conception, M.D.   On: 09/26/2015 16:17    EKG:   Orders placed or performed in visit on 02/10/12  . EKG 12-Lead    ASSESSMENT AND PLAN:  1. Left-sided  pyelonephritis with enlarged left kidney: On IV Levaquin, IV fluids, IV pain  medication. Continue to monitor urine output with Foley catheter. Seen by urology Dr. Hollice Espy. Patient had trouble urinating at home associated with left flank pain. Patient has chronic left-sided  Kidney enlargement. Needs ; left nephrectomy as an  Out pt.urine cultures enterocci.Discharge him with Levaquin 250 mg daily for 14 days, follow-up with Dr. Erlene Quan in one week D/c with foley due to [facility] with urology in one week for voiding trial #2 BPH continue Flomax.    Discussed with patient, patient's registered nurse, patient's wife   All the records are reviewed and case discussed with Care Management/Social Workerr. Management plans discussed with the patient, family and they are in agreement.  CODE STATUS: full  TOTAL TIME TAKING CARE OF THIS PATIENT: 35 minutes.   D/c home today.   Epifanio Lesches M.D on 09/28/2015 at 2:24 PM  Between 7am to 6pm - Pager - [phone]  After 6pm go to [email or web address] - password [email or web address]  Office  [phone]  CC: Primary care physician; Tracie Harrier, MD   Note: This dictation was prepared with Dragon dictation along with smaller phrase technology. Any transcriptional errors that result from this process are unintentional.

## 2015-09-28 NOTE — Telephone Encounter (Signed)
Patient came home from the hospital today and he would like to know if he can have something for the burning from the catheter. His wife said that they were supposed to have given him something when he left today. She wants it called into [facility] and a phone call to let them know it has been done. I explained that it was almost 5:00 and we would try to get this done tonight, if not to please call me back first thing in the am and I would check on it for her.   Thanks,   Sharyn Lull

## 2015-09-28 NOTE — Progress Notes (Signed)
Patient discharged home with family.  14 french coude catheter placed and leg bag applied for discharge.  674ml output noted after placement. All discharge instructions reviewed and discharge paperwork given to patient.  Patient verbalized understanding.  IV removed intact. All questions and concerns addressed. Patient's wife at bedside for transfer home.

## 2015-09-28 NOTE — Progress Notes (Addendum)
Urology Consult Follow Up  Subjective: Afebrile x 24 hours.  Voiding frequency.  No bladder scan performed.  Otherwise doing well.    Anti-infectives: Anti-infectives    Start     Dose/Rate Route Frequency Ordered Stop   09/26/15 1800  Levofloxacin (LEVAQUIN) IVPB 250 mg     250 mg 50 mL/hr over 60 Minutes Intravenous Every 24 hours 09/25/15 2102     09/25/15 1945  ciprofloxacin (CIPRO) IVPB 400 mg     400 mg 200 mL/hr over 60 Minutes Intravenous  Once 09/25/15 1944 09/25/15 2131      Current Facility-Administered Medications  Medication Dose Route Frequency Provider Last Rate Last Dose  . 0.9 %  sodium chloride infusion   Intravenous Continuous Epifanio Lesches, MD 50 mL/hr at 09/27/15 1337    . acetaminophen (TYLENOL) tablet 650 mg  650 mg Oral Q6H PRN Epifanio Lesches, MD   650 mg at 09/27/15 2115   Or  . acetaminophen (TYLENOL) suppository 650 mg  650 mg Rectal Q6H PRN Epifanio Lesches, MD      . bisacodyl (DULCOLAX) EC tablet 5 mg  5 mg Oral Daily PRN Epifanio Lesches, MD      . docusate sodium (COLACE) capsule 100 mg  100 mg Oral BID Epifanio Lesches, MD   100 mg at 09/27/15 2115  . enoxaparin (LOVENOX) injection 40 mg  40 mg Subcutaneous Q24H Epifanio Lesches, MD   40 mg at 09/27/15 2115  . finasteride (PROSCAR) tablet 5 mg  5 mg Oral Daily Epifanio Lesches, MD   5 mg at 09/27/15 0913  . fluticasone (FLONASE) 50 MCG/ACT nasal spray 2 spray  2 spray Each Nare Daily Epifanio Lesches, MD   2 spray at 09/27/15 0915  . HYDROcodone-acetaminophen (NORCO/VICODIN) 5-325 MG per tablet 1-2 tablet  1-2 tablet Oral Q4H PRN Epifanio Lesches, MD   2 tablet at 09/28/15 0744  . labetalol (NORMODYNE,TRANDATE) injection 10-20 mg  10-20 mg Intravenous Q2H PRN Harrie Foreman, MD   20 mg at 09/28/15 0558  . Levofloxacin (LEVAQUIN) IVPB 250 mg  250 mg Intravenous Q24H Epifanio Lesches, MD   250 mg at 09/27/15 1701  . ondansetron (ZOFRAN) tablet 4 mg  4 mg Oral Q6H PRN  Epifanio Lesches, MD       Or  . ondansetron (ZOFRAN) injection 4 mg  4 mg Intravenous Q6H PRN Epifanio Lesches, MD      . pantoprazole (PROTONIX) EC tablet 40 mg  40 mg Oral Daily Epifanio Lesches, MD   40 mg at 09/27/15 0915  . phenazopyridine (PYRIDIUM) tablet 100 mg  100 mg Oral TID WC Epifanio Lesches, MD   100 mg at 09/28/15 0743  . pneumococcal 23 valent vaccine (PNU-IMMUNE) injection 0.5 mL  0.5 mL Intramuscular Tomorrow-1000 Epifanio Lesches, MD      . senna (SENOKOT) tablet 17.2 mg  2 tablet Oral QHS Epifanio Lesches, MD   17.2 mg at 09/27/15 2115  . simvastatin (ZOCOR) tablet 40 mg  40 mg Oral Daily Epifanio Lesches, MD   40 mg at 09/27/15 0914  . tamsulosin (FLOMAX) capsule 0.4 mg  0.4 mg Oral Daily Epifanio Lesches, MD   0.4 mg at 09/27/15 0915  . traMADol (ULTRAM) tablet 50 mg  50 mg Oral TID Epifanio Lesches, MD   50 mg at 09/27/15 2115  . traZODone (DESYREL) tablet 25 mg  25 mg Oral QHS PRN Epifanio Lesches, MD      . vitamin E capsule 400 Units  400 Units Oral  Daily Epifanio Lesches, MD   400 Units at 09/27/15 0914     Objective: Vital signs in last 24 hours: Temp:  [98.4 F (36.9 C)-99.2 F (37.3 C)] 99.2 F (37.3 C) (08/10 0509) Pulse Rate:  [82-96] 85 (08/10 0631) Resp:  [18-20] 20 (08/10 0509) BP: (162-184)/(90-98) 174/91 (08/10 0631) SpO2:  [98 %-100 %] 99 % (08/10 0631) Weight:  [230 lb 8 oz (104.6 kg)] 230 lb 8 oz (104.6 kg) (08/10 0500)  Intake/Output from previous day: 08/09 0701 - 08/10 0700 In: 2265.8 [P.O.:480; I.V.:1785.8] Out: 1775 [Urine:1775] Intake/Output this shift: Total I/O In: 150.2 [P.O.:60; I.V.:90.2] Out: 150 [Urine:150]   Physical Exam  Constitutional: He is oriented to person, place, and time and well-developed, well-nourished, and in no distress.  HENT:  Head: Normocephalic and atraumatic.  Abdominal: Soft.  Musculoskeletal: Normal range of motion.  Neurological: He is alert and oriented to person,  place, and time.  Skin: Skin is warm and dry.    Lab Results:   Recent Labs  09/25/15 0949 09/26/15 0444  WBC 14.6* 11.7*  HGB 15.2 13.1  HCT 43.9 38.1*  PLT 143* 118*   BMET  Recent Labs  09/25/15 0949 09/26/15 0444  NA 136 137  K 3.8 3.7  CL 104 109  CO2 23 24  GLUCOSE 155* 98  BUN 21* 17  CREATININE 1.03 1.03  CALCIUM 8.7* 7.6*    Studies/Results: Ct Head Wo Contrast  Result Date: 09/26/2015 CLINICAL DATA:  Frequent headaches. EXAM: CT HEAD WITHOUT CONTRAST TECHNIQUE: Contiguous axial images were obtained from the base of the skull through the vertex without intravenous contrast. COMPARISON:  None. FINDINGS: Bony calvarium appears intact. Mild chronic ischemic white matter disease is noted. No mass effect or midline shift is noted. Ventricular size is within normal limits. There is no evidence of mass lesion, hemorrhage or acute infarction. IMPRESSION: Mild chronic ischemic white matter disease. No acute intracranial abnormality seen. Electronically Signed   By: Marijo Conception, M.D.   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   Ucx growing enterococcus, sensitive to levaquin.  Recommend 14 days abx.    Will need nephrectomy as outpatient after infection adequately treated- follow up in 1 month after discharge with Urology to discuss furhter   2) Urinary frequency/difficulty voiding-  Check PVR with bladder scan, discussed with nurse  3) BPH Continue flomax and finasteride    LOS: 3 days    Hollice Espy 09/28/2015   Addendum: Patient continues to have elevated postvoid residuals in the 200s and severe urinary frequency with small frequent voids. As such, I have recommended replacement of his Foley catheter and follow-up in 1 week for voiding trial. This was discussed with the patient and all his questions were answered.

## 2015-09-28 NOTE — Progress Notes (Signed)
Notified Dr.diamond of pt b/p of 184/98. Acknowledged and orders to be placed.

## 2015-09-29 ENCOUNTER — Inpatient Hospital Stay
Admission: EM | Admit: 2015-09-29 | Discharge: 2015-10-01 | DRG: 854 | Disposition: A | Discharge status: Home / Self Care | Payer: Medicare Other | Attending: Specialist | Admitting: Specialist

## 2015-09-29 ENCOUNTER — Emergency Department: Payer: Medicare Other

## 2015-09-29 ENCOUNTER — Encounter: Payer: Self-pay | Admitting: Emergency Medicine

## 2015-09-29 ENCOUNTER — Inpatient Hospital Stay: Payer: Medicare Other

## 2015-09-29 DIAGNOSIS — E785 Hyperlipidemia, unspecified: Secondary | ICD-10-CM | POA: Diagnosis present

## 2015-09-29 DIAGNOSIS — N1330 Unspecified hydronephrosis: Secondary | ICD-10-CM | POA: Diagnosis not present

## 2015-09-29 DIAGNOSIS — A419 Sepsis, unspecified organism: Principal | ICD-10-CM | POA: Diagnosis present

## 2015-09-29 DIAGNOSIS — Z79899 Other long term (current) drug therapy: Secondary | ICD-10-CM

## 2015-09-29 DIAGNOSIS — E860 Dehydration: Secondary | ICD-10-CM | POA: Diagnosis present

## 2015-09-29 DIAGNOSIS — I10 Essential (primary) hypertension: Secondary | ICD-10-CM | POA: Diagnosis present

## 2015-09-29 DIAGNOSIS — N179 Acute kidney failure, unspecified: Secondary | ICD-10-CM | POA: Diagnosis present

## 2015-09-29 DIAGNOSIS — R509 Fever, unspecified: Secondary | ICD-10-CM | POA: Diagnosis not present

## 2015-09-29 DIAGNOSIS — K219 Gastro-esophageal reflux disease without esophagitis: Secondary | ICD-10-CM | POA: Diagnosis present

## 2015-09-29 DIAGNOSIS — N10 Acute tubulo-interstitial nephritis: Secondary | ICD-10-CM | POA: Diagnosis present

## 2015-09-29 DIAGNOSIS — Q6239 Other obstructive defects of renal pelvis and ureter: Secondary | ICD-10-CM

## 2015-09-29 DIAGNOSIS — R1012 Left upper quadrant pain: Secondary | ICD-10-CM

## 2015-09-29 DIAGNOSIS — E7800 Pure hypercholesterolemia, unspecified: Secondary | ICD-10-CM | POA: Diagnosis present

## 2015-09-29 DIAGNOSIS — R338 Other retention of urine: Secondary | ICD-10-CM | POA: Diagnosis present

## 2015-09-29 DIAGNOSIS — N136 Pyonephrosis: Secondary | ICD-10-CM | POA: Diagnosis present

## 2015-09-29 DIAGNOSIS — Z96651 Presence of right artificial knee joint: Secondary | ICD-10-CM | POA: Diagnosis present

## 2015-09-29 DIAGNOSIS — Z87891 Personal history of nicotine dependence: Secondary | ICD-10-CM

## 2015-09-29 DIAGNOSIS — Z91010 Allergy to peanuts: Secondary | ICD-10-CM

## 2015-09-29 DIAGNOSIS — N401 Enlarged prostate with lower urinary tract symptoms: Secondary | ICD-10-CM | POA: Diagnosis present

## 2015-09-29 DIAGNOSIS — R109 Unspecified abdominal pain: Secondary | ICD-10-CM

## 2015-09-29 HISTORY — PX: IR GENERIC HISTORICAL: IMG1180011

## 2015-09-29 LAB — CBC WITH DIFFERENTIAL/PLATELET
BASOS ABS: 0 10*3/uL (ref 0–0.1)
Basophils Relative: 0 %
EOS ABS: 0.1 10*3/uL (ref 0–0.7)
EOS PCT: 1 %
HCT: 32.7 % — ABNORMAL LOW (ref 40.0–52.0)
Hemoglobin: 11.3 g/dL — ABNORMAL LOW (ref 13.0–18.0)
LYMPHS PCT: 11 %
Lymphs Abs: 1.1 10*3/uL (ref 1.0–3.6)
MCH: 31.6 pg (ref 26.0–34.0)
MCHC: 34.6 g/dL (ref 32.0–36.0)
MCV: 91.5 fL (ref 80.0–100.0)
MONO ABS: 1.7 10*3/uL — AB (ref 0.2–1.0)
Monocytes Relative: 18 %
NEUTROS ABS: 6.5 10*3/uL (ref 1.4–6.5)
NEUTROS PCT: 70 %
PLATELETS: 187 10*3/uL (ref 150–440)
RBC: 3.58 MIL/uL — AB (ref 4.40–5.90)
RDW: 14.4 % (ref 11.5–14.5)
WBC: 9.3 10*3/uL (ref 3.8–10.6)

## 2015-09-29 LAB — COMPREHENSIVE METABOLIC PANEL
ALK PHOS: 63 U/L (ref 38–126)
ALT: 19 U/L (ref 17–63)
AST: 28 U/L (ref 15–41)
Albumin: 2.6 g/dL — ABNORMAL LOW (ref 3.5–5.0)
Anion gap: 5 (ref 5–15)
BUN: 16 mg/dL (ref 6–20)
CHLORIDE: 103 mmol/L (ref 101–111)
CO2: 24 mmol/L (ref 22–32)
Calcium: 8.1 mg/dL — ABNORMAL LOW (ref 8.9–10.3)
Creatinine, Ser: 1.1 mg/dL (ref 0.61–1.24)
Glucose, Bld: 124 mg/dL — ABNORMAL HIGH (ref 65–99)
POTASSIUM: 4.8 mmol/L (ref 3.5–5.1)
Sodium: 132 mmol/L — ABNORMAL LOW (ref 135–145)
Total Bilirubin: 1.1 mg/dL (ref 0.3–1.2)
Total Protein: 6.1 g/dL — ABNORMAL LOW (ref 6.5–8.1)

## 2015-09-29 LAB — URINALYSIS COMPLETE WITH MICROSCOPIC (ARMC ONLY)
BILIRUBIN URINE: NEGATIVE
Glucose, UA: NEGATIVE mg/dL
KETONES UR: NEGATIVE mg/dL
LEUKOCYTES UA: NEGATIVE
Nitrite: NEGATIVE
PH: 5 (ref 5.0–8.0)
PROTEIN: 100 mg/dL — AB
SPECIFIC GRAVITY, URINE: 1.016 (ref 1.005–1.030)

## 2015-09-29 LAB — LACTIC ACID, PLASMA
LACTIC ACID, VENOUS: 0.7 mmol/L (ref 0.5–1.9)
LACTIC ACID, VENOUS: 0.8 mmol/L (ref 0.5–1.9)

## 2015-09-29 MED ORDER — LIDOCAINE HCL (PF) 1 % IJ SOLN
INTRAMUSCULAR | Status: AC | PRN
  Administered 2015-09-29: 5 mL

## 2015-09-29 MED ORDER — ONDANSETRON HCL 4 MG/2ML IJ SOLN: 4.0000 mg | Freq: Four times a day (QID) | INTRAMUSCULAR | Status: DC | PRN

## 2015-09-29 MED ORDER — ACETAMINOPHEN 325 MG PO TABS
650.0000 mg | ORAL_TABLET | Freq: Once | ORAL | Status: DC
  Filled 2015-09-29: qty 2

## 2015-09-29 MED ORDER — LIDOCAINE HCL (PF) 1 % IJ SOLN
INTRAMUSCULAR | Status: AC
  Filled 2015-09-29: qty 30

## 2015-09-29 MED ORDER — MIDAZOLAM HCL 2 MG/2ML IJ SOLN
INTRAMUSCULAR | Status: AC | PRN
  Administered 2015-09-29 (×2): 0.5 mg via INTRAVENOUS

## 2015-09-29 MED ORDER — DEXTROSE 5 % IV SOLN
2.0000 g | INTRAVENOUS | Status: DC
  Administered 2015-09-30 – 2015-10-01 (×2): 2 g via INTRAVENOUS
  Filled 2015-09-29 (×2): qty 2

## 2015-09-29 MED ORDER — PANTOPRAZOLE SODIUM 40 MG PO TBEC
40.0000 mg | DELAYED_RELEASE_TABLET | Freq: Every day | ORAL | Status: DC
  Administered 2015-09-30 – 2015-10-01 (×2): 40 mg via ORAL
  Filled 2015-09-29 (×2): qty 1

## 2015-09-29 MED ORDER — ONDANSETRON HCL 4 MG PO TABS: 4.0000 mg | ORAL_TABLET | Freq: Four times a day (QID) | ORAL | Status: DC | PRN

## 2015-09-29 MED ORDER — VITAMIN E 180 MG (400 UNIT) PO CAPS
400.0000 [IU] | ORAL_CAPSULE | Freq: Every day | ORAL | Status: DC
  Administered 2015-09-30 – 2015-10-01 (×2): 400 [IU] via ORAL
  Filled 2015-09-29 (×2): qty 1

## 2015-09-29 MED ORDER — ACETAMINOPHEN 650 MG RE SUPP: 650.0000 mg | Freq: Once | RECTAL | Status: DC

## 2015-09-29 MED ORDER — CEFTRIAXONE SODIUM 1 G IJ SOLR
1.0000 g | Freq: Once | INTRAMUSCULAR | Status: AC
  Administered 2015-09-29: 1 g via INTRAVENOUS
  Filled 2015-09-29: qty 10

## 2015-09-29 MED ORDER — ADULT MULTIVITAMIN W/MINERALS CH
1.0000 | ORAL_TABLET | Freq: Every day | ORAL | Status: DC
  Administered 2015-09-30 – 2015-10-01 (×2): 1 via ORAL
  Filled 2015-09-29 (×2): qty 1

## 2015-09-29 MED ORDER — MIDAZOLAM HCL 2 MG/2ML IJ SOLN
INTRAMUSCULAR | Status: AC
  Filled 2015-09-29: qty 2

## 2015-09-29 MED ORDER — DOCUSATE SODIUM 100 MG PO CAPS
100.0000 mg | ORAL_CAPSULE | Freq: Two times a day (BID) | ORAL | Status: DC
  Administered 2015-09-29 – 2015-10-01 (×4): 100 mg via ORAL
  Filled 2015-09-29 (×4): qty 1

## 2015-09-29 MED ORDER — FENTANYL CITRATE (PF) 100 MCG/2ML IJ SOLN
INTRAMUSCULAR | Status: AC
  Filled 2015-09-29: qty 2

## 2015-09-29 MED ORDER — HYDROMORPHONE HCL 1 MG/ML IJ SOLN
1.0000 mg | INTRAMUSCULAR | Status: DC | PRN
  Administered 2015-09-29 – 2015-10-01 (×4): 1 mg via INTRAVENOUS
  Filled 2015-09-29 (×3): qty 1

## 2015-09-29 MED ORDER — LISINOPRIL 10 MG PO TABS
30.0000 mg | ORAL_TABLET | Freq: Every day | ORAL | Status: DC
  Administered 2015-09-30 – 2015-10-01 (×2): 30 mg via ORAL
  Filled 2015-09-29 (×2): qty 1

## 2015-09-29 MED ORDER — TRAMADOL HCL 50 MG PO TABS
50.0000 mg | ORAL_TABLET | Freq: Three times a day (TID) | ORAL | Status: DC
  Administered 2015-09-29 – 2015-09-30 (×4): 50 mg via ORAL
  Filled 2015-09-29 (×5): qty 1

## 2015-09-29 MED ORDER — SODIUM CHLORIDE 0.9 % IV SOLN
1000.0000 mL | INTRAVENOUS | Status: DC
  Administered 2015-09-29: 1000 mL via INTRAVENOUS

## 2015-09-29 MED ORDER — FLUTICASONE PROPIONATE 50 MCG/ACT NA SUSP
2.0000 | Freq: Every day | NASAL | Status: DC
  Administered 2015-09-30 – 2015-10-01 (×2): 2 via NASAL
  Filled 2015-09-29: qty 16

## 2015-09-29 MED ORDER — SODIUM CHLORIDE 0.9 % IV SOLN
INTRAVENOUS | Status: DC
  Administered 2015-09-29 – 2015-10-01 (×4): via INTRAVENOUS

## 2015-09-29 MED ORDER — FENTANYL CITRATE (PF) 100 MCG/2ML IJ SOLN
INTRAMUSCULAR | Status: AC | PRN
  Administered 2015-09-29 (×2): 25 ug via INTRAVENOUS

## 2015-09-29 MED ORDER — LEVOFLOXACIN IN D5W 500 MG/100ML IV SOLN
500.0000 mg | INTRAVENOUS | Status: DC
  Administered 2015-09-29 – 2015-09-30 (×2): 500 mg via INTRAVENOUS
  Filled 2015-09-29 (×3): qty 100

## 2015-09-29 MED ORDER — LORATADINE 10 MG PO TABS
10.0000 mg | ORAL_TABLET | Freq: Every day | ORAL | Status: DC
  Administered 2015-09-30 – 2015-10-01 (×2): 10 mg via ORAL
  Filled 2015-09-29 (×2): qty 1

## 2015-09-29 MED ORDER — FINASTERIDE 5 MG PO TABS
5.0000 mg | ORAL_TABLET | Freq: Every day | ORAL | Status: DC
  Administered 2015-09-30 – 2015-10-01 (×2): 5 mg via ORAL
  Filled 2015-09-29 (×2): qty 1

## 2015-09-29 MED ORDER — TAMSULOSIN HCL 0.4 MG PO CAPS
0.4000 mg | ORAL_CAPSULE | Freq: Every day | ORAL | Status: DC
  Administered 2015-09-30 – 2015-10-01 (×2): 0.4 mg via ORAL
  Filled 2015-09-29 (×2): qty 1

## 2015-09-29 MED ORDER — ENOXAPARIN SODIUM 40 MG/0.4ML ~~LOC~~ SOLN
40.0000 mg | SUBCUTANEOUS | Status: DC
  Administered 2015-09-30: 40 mg via SUBCUTANEOUS
  Filled 2015-09-29: qty 0.4

## 2015-09-29 MED ORDER — SIMVASTATIN 40 MG PO TABS
40.0000 mg | ORAL_TABLET | Freq: Every day | ORAL | Status: DC
  Administered 2015-09-30 – 2015-10-01 (×2): 40 mg via ORAL
  Filled 2015-09-29 (×2): qty 1

## 2015-09-29 MED ORDER — ACETAMINOPHEN 650 MG RE SUPP: 650.0000 mg | Freq: Four times a day (QID) | RECTAL | Status: DC | PRN

## 2015-09-29 MED ORDER — IOPAMIDOL (ISOVUE-300) INJECTION 61%
50.0000 mL | Freq: Once | INTRAVENOUS | Status: AC | PRN
  Administered 2015-09-29: 5 mL via INTRAVENOUS

## 2015-09-29 MED ORDER — HYDROMORPHONE HCL 1 MG/ML IJ SOLN
INTRAMUSCULAR | Status: AC
  Administered 2015-09-29: 1 mg via INTRAVENOUS
  Filled 2015-09-29: qty 1

## 2015-09-29 MED ORDER — ACETAMINOPHEN 325 MG PO TABS
650.0000 mg | ORAL_TABLET | Freq: Four times a day (QID) | ORAL | Status: DC | PRN
  Administered 2015-09-29: 650 mg via ORAL
  Filled 2015-09-29: qty 2

## 2015-09-29 NOTE — Telephone Encounter (Signed)
Line busy

## 2015-09-29 NOTE — Sedation Documentation (Signed)
Vital signs stable. Report called to Mariane Masters, RN 2C for admission to Rm#[id number].

## 2015-09-29 NOTE — Telephone Encounter (Signed)
Spoke with the patient's wife and she will go pick it up.   Sharyn Lull

## 2015-09-29 NOTE — Consult Note (Signed)
Chief Complaint: Patient was seen in consultation today for  Chief Complaint  Patient presents with  . Blood Infection   at the request of * No referring provider recorded for this case *  Referring Physician(s): Erlene Quan MD  Supervising Physician: [name]  Patient Status: Inpatient  History of Present Illness: Craig [name] is a 70 y.o. male with chronic severe left hydronephrosis who was admitted for fever this week and treated conservatively with antibiotics. He did well until today and developed fevers and increasing pain/malaise. He is referred for nephrostomy drainage.  Past Medical History:  Diagnosis Date  . BPH (benign prostatic hyperplasia)   . Hypercholesterolemia   . Hypertension     Past Surgical History:  Procedure Laterality Date  . REPLACEMENT TOTAL KNEE Right     Allergies: Peanut oil  Medications: Prior to Admission medications   Medication Sig Start Date End Date Taking? Authorizing Provider  acetaminophen (TYLENOL 8 HOUR ARTHRITIS PAIN) 650 MG CR tablet Take 650 mg by mouth every 8 (eight) hours as needed for pain.   Yes Historical Provider, MD  cetirizine (ZYRTEC) 10 MG tablet Take 10 mg by mouth daily.   Yes Historical Provider, MD  docusate sodium (COLACE) 100 MG capsule Take 100 mg by mouth 2 (two) times daily.   Yes Historical Provider, MD  finasteride (PROSCAR) 5 MG tablet Take 5 mg by mouth daily.   Yes Historical Provider, MD  fluticasone (FLONASE) 50 MCG/ACT nasal spray Place 2 sprays into both nostrils daily.   Yes Historical Provider, MD  levofloxacin (LEVAQUIN) 250 MG tablet Take 1 tablet (250 mg total) by mouth daily. 09/28/15  Yes Epifanio Lesches, MD  lisinopril (PRINIVIL,ZESTRIL) 30 MG tablet Take 30 mg by mouth daily.   Yes Historical Provider, MD  Multiple Vitamin (MULTIVITAMIN) tablet Take 1 tablet by mouth daily.   Yes Historical Provider, MD  omeprazole (PRILOSEC) 40 MG capsule Take 40 mg by mouth 2 (two) times  daily.   Yes Historical Provider, MD  simvastatin (ZOCOR) 40 MG tablet Take 40 mg by mouth daily.   Yes Historical Provider, MD  tamsulosin (FLOMAX) 0.4 MG CAPS capsule Take 0.4 mg by mouth daily.   Yes Historical Provider, MD  traMADol (ULTRAM) 50 MG tablet Take 50 mg by mouth 3 (three) times daily.   Yes Historical Provider, MD  vitamin E 400 UNIT capsule Take 400 Units by mouth daily.   Yes Historical Provider, MD     Family History  Problem Relation Age of Onset  . Kidney cancer Mother   . Lung cancer Father     Social History   Social History  . Marital status: Married    Spouse name: N/A  . Number of children: N/A  . Years of education: N/A   Social History Main Topics  . Smoking status: Former Smoker    Packs/day: 1.50    Years: 20.00    Quit date: 09/25/1983  . Smokeless tobacco: Never Used  . Alcohol use No  . Drug use: No  . Sexual activity: Not Asked   Other Topics Concern  . None   Social History Narrative  . None      Review of Systems: A 12 point ROS discussed and pertinent positives are indicated in the HPI above.  All other systems are negative.  Review of Systems  Vital Signs: BP (!) 221/74   Pulse 91   Temp (!) 102 F (38.9 C) (Oral)   Resp 18  Ht 5\' 11"  (1.803 m)   Wt 230 lb (104.3 kg)   SpO2 99%   BMI 32.08 kg/m   Physical Exam  Constitutional: He is oriented to person, place, and time. He appears well-developed and well-nourished.  HENT:  Head: Normocephalic and atraumatic.  Cardiovascular: Normal rate and regular rhythm.   Pulmonary/Chest: Effort normal and breath sounds normal.  Musculoskeletal: Normal range of motion.  Neurological: He is alert and oriented to person, place, and time.    Mallampati Score: 2    Imaging: Ct Head Wo Contrast  Result Date: 09/26/2015 CLINICAL DATA:  Frequent headaches. EXAM: CT HEAD WITHOUT CONTRAST TECHNIQUE: Contiguous axial images were obtained from the base of the skull through the vertex  without intravenous contrast. COMPARISON:  None. FINDINGS: Bony calvarium appears intact. Mild chronic ischemic white matter disease is noted. No mass effect or midline shift is noted. Ventricular size is within normal limits. There is no evidence of mass lesion, hemorrhage or acute infarction. IMPRESSION: Mild chronic ischemic white matter disease. No acute intracranial abnormality seen. Electronically Signed   By: Marijo Conception, M.D.   On: 09/26/2015 16:17   Ct Abdomen Pelvis W Contrast  Result Date: 09/25/2015 CLINICAL DATA:  70 year old male with urinary retention and chills since 1600 hours yesterday. Initial encounter. EXAM: CT ABDOMEN AND PELVIS WITH CONTRAST TECHNIQUE: Multidetector CT imaging of the abdomen and pelvis was performed using the standard protocol following bolus administration of intravenous contrast. CONTRAST:  175mL ISOVUE-300 IOPAMIDOL (ISOVUE-300) INJECTION 61% COMPARISON:  CT abdomen without and with contrast 08/16/2013. Report of nuclear medicine renal scan with Lasix 04/03/2001 (no images available). FINDINGS: Mild linear atelectasis and respiratory motion artifact at the lung bases. No pericardial or pleural effusion. No acute osseous abnormality identified. There is a small volume of pelvic free fluid situated adjacent to distal small bowel loops. The urinary bladder is mildly to moderately distended, otherwise appears normal. See renal findings below. Negative rectum. Redundant sigmoid colon with mild diverticulosis, but no active inflammation. The left colon is negative aside from being displaced and compressed into the left lateral abdomen by the abnormal left kidney described below. Negative transverse colon. Oral contrast has reached the mid transverse colon. Redundant hepatic flexure. Negative right colon and appendix. Negative terminal ileum. No dilated small bowel. Small gastric hiatal has increased since 2015. Negative duodenum. No abdominal free air or free fluid.  Negative liver, gallbladder, pancreas. Portal venous system is patent. Aortoiliac calcified atherosclerosis noted. Major arterial structures are patent. There is mild mass effect on the spleen and regional soft tissue stranding which emanates from the left kidney. Mild mass effect and stranding at the left adrenal gland as well. There is chronic severe left hydronephrosis and thinning of the left renal parenchyma. In 2015 the markedly enlarged left renal collecting system measured up to about 16.5 cm. Today the abnormal left kidney has further enlarged, with a massively dilated collecting system up to 19.3 cm . Also, the left kidney appears acutely inflamed with new inflammatory stranding throughout the left para renal space. The left kidney is also hypoenhancing relative to that on the right, but this is chronic to an extent. The left ureter appears to be decompressed, and is difficult to identify at its junction with the massively dilated left renal pelvis. No urologic calculus or nephrocalcinosis. Mild, reactive appearing retroperitoneal lymph nodes on the left. Secondary inflammation of the ventral surface of the left psoas muscle. There is also mild secondary stranding in the mesentery of  the left splenic flexure. As expected on delayed renal images there is contrast excretion from the right kidney, but non on the left. IMPRESSION: 1. Chronic markedly dilated left kidney has further enlarged since 2015 (massively dilated) and is newly inflamed with confluent left pararenal stranding. Reactive left retroperitoneal lymphadenopathy and secondary inflammation of the adjacent left colon, spleen and psoas muscle. Suspect Acute Superinfection of the chronically abnormal kidney. Recommend Urology consultation. 2. Small volume of pelvic free fluid also felt secondary to #1. 3.  Calcified aortic atherosclerosis. Electronically Signed   By: Genevie Ann M.D.   On: 09/25/2015 15:56   Dg Chest Port 1 View  Result Date:  09/29/2015 CLINICAL DATA:  Fever EXAM: PORTABLE CHEST 1 VIEW COMPARISON:  09/25/2015 chest radiograph. FINDINGS: Stable cardiomediastinal silhouette with normal heart size. No pneumothorax. No pleural effusion. No pulmonary edema. Mild hazy opacity at the left lung base. IMPRESSION: Mild hazy left lung base opacity, favor atelectasis, cannot exclude mild aspiration or developing pneumonia. Recommend follow-up PA and lateral post treatment chest radiographs in 4-6 weeks. Electronically Signed   By: Ilona Sorrel M.D.   On: 09/29/2015 18:39   Dg Chest Port 1 View  Result Date: 09/25/2015 CLINICAL DATA:  70 year old male with headache dizziness and Dysuria for 3 days. Initial encounter. Former smoker. EXAM: PORTABLE CHEST 1 VIEW COMPARISON:  Report of portable chest x-ray 01/12/2001 (no images available). FINDINGS: Portable AP upright view at 0955 hours. Cardiac size at the upper limits of normal. Other mediastinal contours are within normal limits. Visualized tracheal air column is within normal limits. No pneumothorax, pulmonary edema, pleural effusion or confluent opacity. Minimal atelectasis or scarring at the left lung base. IMPRESSION: No acute cardiopulmonary abnormality. Electronically Signed   By: Genevie Ann M.D.   On: 09/25/2015 10:21    Labs:  CBC:  Recent Labs  09/25/15 0949 09/26/15 0444 09/29/15 1750  WBC 14.6* 11.7* 9.3  HGB 15.2 13.1 11.3*  HCT 43.9 38.1* 32.7*  PLT 143* 118* 187    COAGS: No results for input(s): INR, APTT in the last 8760 hours.  BMP:  Recent Labs  09/25/15 0949 09/26/15 0444 09/29/15 1750  NA 136 137 132*  K 3.8 3.7 4.8  CL 104 109 103  CO2 23 24 24   GLUCOSE 155* 98 124*  BUN 21* 17 16  CALCIUM 8.7* 7.6* 8.1*  CREATININE 1.03 1.03 1.10  GFRNONAA >60 >60 >60  GFRAA >60 >60 >60    LIVER FUNCTION TESTS:  Recent Labs  09/25/15 0949 09/29/15 1750  BILITOT 0.9 1.1  AST 15 28  ALT 17 19  ALKPHOS 65 63  PROT 6.7 6.1*  ALBUMIN 3.8 2.6*     TUMOR MARKERS: No results for input(s): AFPTM, CEA, CA199, CHROMGRNA in the last 8760 hours.  Assessment and Plan:  Left ureteral obstruction and fevers (102). Left nephrostomy to follow.  Thank you for this interesting consult.  I greatly enjoyed meeting Craig [name] and look forward to participating in their care.  A copy of this report was sent to the requesting provider on this date.  Electronically Signed: Taylynn Easton, ART A 09/29/2015, 9:21 PM   I spent a total of 40 Minutes  in face to face in clinical consultation, greater than 50% of which was counseling/coordinating care for left nephrostomy placement.

## 2015-09-29 NOTE — H&P (Signed)
[employer] [facility] [place] [facility]   PATIENT NAME: Craig [name]    MR#:  [id number]  DATE OF BIRTH:  1945/08/31  DATE OF ADMISSION:  09/29/2015  PRIMARY CARE PHYSICIAN: Barbette Reichmann, MD   REQUESTING/REFERRING PHYSICIAN: Dr. Lacretia Nicks  CHIEF COMPLAINT:   Chief Complaint  Patient presents with  . Blood Infection    HISTORY OF PRESENT ILLNESS:  Craig [name]  is a 70 y.o. male with a known history of Hypertension, hyperlipidemia, BPH, recent admission due to right-sided pyelonephritis with hydronephrosis and sent home with a chronic indwelling Foley with oral Levaquin for now returns to the hospital due to fever. Patient had a fever of 100 home and which climbed up to as high as 102 and therefore was brought to the ER for further evaluation. Patient has a enlarged left sided kidney with urinary retention and was discharged yesterday and plan was for eventual left-sided nephrectomy. He was discharged on oral Levaquin but despite that he continued to have high fevers and therefore was brought to the ER for further evaluation. Patient has a headache high-grade left UPJ obstruction and likely needs a percutaneous nephrostomy drainage. Patient has been seen by urology and they have discussed with interventional radiology for placement of percutaneous nephrostomy tube. Hospitalist services were contacted further treatment and evaluation. Patient is complaining of some left-sided flank pain but no nausea, vomiting, chest pain shortness of breath or any other associated symptoms presently.  PAST MEDICAL HISTORY:   Past Medical History:  Diagnosis Date  . BPH (benign prostatic hyperplasia)   . Hypercholesterolemia   . Hypertension     PAST SURGICAL HISTORY:   Past Surgical History:  Procedure Laterality Date  . REPLACEMENT TOTAL KNEE Right     SOCIAL HISTORY:   Social History  Substance Use Topics  . Smoking status: Former Smoker    Packs/day: 1.50     Years: 20.00    Quit date: 09/25/1983  . Smokeless tobacco: Never Used  . Alcohol use No    FAMILY HISTORY:   Family History  Problem Relation Age of Onset  . Kidney cancer Mother   . Lung cancer Father     DRUG ALLERGIES:   Allergies  Allergen Reactions  . Peanut Oil Other (See Comments)    Mouth ulcers    REVIEW OF SYSTEMS:   Review of Systems  Constitutional: Negative for fever and weight loss.  HENT: Negative for congestion, nosebleeds and tinnitus.   Eyes: Negative for blurred vision, double vision and redness.  Respiratory: Negative for cough, hemoptysis and shortness of breath.   Cardiovascular: Negative for chest pain, orthopnea, leg swelling and PND.  Gastrointestinal: Positive for abdominal pain (Left Flank). Negative for diarrhea, melena, nausea and vomiting.  Genitourinary: Negative for dysuria, hematuria and urgency.  Musculoskeletal: Negative for falls and joint pain.  Neurological: Negative for dizziness, tingling, sensory change, focal weakness, seizures, weakness and headaches.  Endo/Heme/Allergies: Negative for polydipsia. Does not bruise/bleed easily.  Psychiatric/Behavioral: Negative for depression and memory loss. The patient is not nervous/anxious.     MEDICATIONS AT HOME:   Prior to Admission medications   Medication Sig Start Date End Date Taking? Authorizing Provider  acetaminophen (TYLENOL 8 HOUR ARTHRITIS PAIN) 650 MG CR tablet Take 650 mg by mouth every 8 (eight) hours as needed for pain.   Yes Historical Provider, MD  cetirizine (ZYRTEC) 10 MG tablet Take 10 mg by mouth daily.   Yes Historical Provider, MD  docusate sodium (COLACE) 100 MG  capsule Take 100 mg by mouth 2 (two) times daily.   Yes Historical Provider, MD  finasteride (PROSCAR) 5 MG tablet Take 5 mg by mouth daily.   Yes Historical Provider, MD  fluticasone (FLONASE) 50 MCG/ACT nasal spray Place 2 sprays into both nostrils daily.   Yes Historical Provider, MD  levofloxacin  (LEVAQUIN) 250 MG tablet Take 1 tablet (250 mg total) by mouth daily. 09/28/15  Yes Epifanio Lesches, MD  lisinopril (PRINIVIL,ZESTRIL) 30 MG tablet Take 30 mg by mouth daily.   Yes Historical Provider, MD  Multiple Vitamin (MULTIVITAMIN) tablet Take 1 tablet by mouth daily.   Yes Historical Provider, MD  omeprazole (PRILOSEC) 40 MG capsule Take 40 mg by mouth 2 (two) times daily.   Yes Historical Provider, MD  simvastatin (ZOCOR) 40 MG tablet Take 40 mg by mouth daily.   Yes Historical Provider, MD  tamsulosin (FLOMAX) 0.4 MG CAPS capsule Take 0.4 mg by mouth daily.   Yes Historical Provider, MD  traMADol (ULTRAM) 50 MG tablet Take 50 mg by mouth 3 (three) times daily.   Yes Historical Provider, MD  vitamin E 400 UNIT capsule Take 400 Units by mouth daily.   Yes Historical Provider, MD      VITAL SIGNS:  Blood pressure (!) 175/101, pulse 93, temperature (!) 102 F (38.9 C), temperature source Oral, resp. rate 18, height 5\' 11"  (1.803 m), weight 104.3 kg (230 lb), SpO2 98 %.  PHYSICAL EXAMINATION:  Physical Exam  GENERAL:  70 y.o.-year-old patient lying in bed in no acute distress.  EYES: Pupils equal, round, reactive to light and accommodation. No scleral icterus. Extraocular muscles intact.  HEENT: Head atraumatic, normocephalic. Oropharynx and nasopharynx clear. No oropharyngeal erythema, moist oral mucosa  NECK:  Supple, no jugular venous distention. No thyroid enlargement, no tenderness.  LUNGS: Normal breath sounds bilaterally, no wheezing, rales, rhonchi. No use of accessory muscles of respiration.  CARDIOVASCULAR: S1, S2 RRR. No murmurs, rubs, gallops, clicks.  ABDOMEN: Soft, tender in the left flank, no rebound, rigidity nondistended. Bowel sounds present. No organomegaly or mass.  EXTREMITIES: No pedal edema, cyanosis, or clubbing. + 2 pedal & radial pulses b/l.   NEUROLOGIC: Cranial nerves II through XII are intact. No focal Motor or sensory deficits appreciated  b/l PSYCHIATRIC: The patient is alert and oriented x 3. Good affect.  SKIN: No obvious rash, lesion, or ulcer.   LABORATORY PANEL:   CBC  Recent Labs Lab 09/29/15 1750  WBC 9.3  HGB 11.3*  HCT 32.7*  PLT 187   ------------------------------------------------------------------------------------------------------------------  Chemistries   Recent Labs Lab 09/29/15 1750  NA 132*  K 4.8  CL 103  CO2 24  GLUCOSE 124*  BUN 16  CREATININE 1.10  CALCIUM 8.1*  AST 28  ALT 19  ALKPHOS 63  BILITOT 1.1   ------------------------------------------------------------------------------------------------------------------  Cardiac Enzymes No results for input(s): TROPONINI in the last 168 hours. ------------------------------------------------------------------------------------------------------------------  RADIOLOGY:  Dg Chest Port 1 View  Result Date: 09/29/2015 CLINICAL DATA:  Fever EXAM: PORTABLE CHEST 1 VIEW COMPARISON:  09/25/2015 chest radiograph. FINDINGS: Stable cardiomediastinal silhouette with normal heart size. No pneumothorax. No pleural effusion. No pulmonary edema. Mild hazy opacity at the left lung base. IMPRESSION: Mild hazy left lung base opacity, favor atelectasis, cannot exclude mild aspiration or developing pneumonia. Recommend follow-up PA and lateral post treatment chest radiographs in 4-6 weeks. Electronically Signed   By: Ilona Sorrel M.D.   On: 09/29/2015 18:39     IMPRESSION AND PLAN:  70 year old male with past medical history of hypertension, GERD, lipidemia, BPH and recent admission for acute left-sided pyelonephritis with high-grade UPJ obstruction who returns to the hospital due to high fevers.  1. Sepsis-patient presents to the hospital with high fevers, tachycardia and recent diagnosis of acute pyelonephritis. -This is likely the source of patient's sepsis given his high-grade UPJ obstruction -I will give the patient IV fluids, broad-spectrum  IV antibiotics with ceftriaxone, Levaquin. His urine cultures on previous hospitalization due to enterococcus which was pansensitive. -Patient to get percutaneous nephrostomy tube by IR later today.  2. High-grade left-sided UPJ obstruction-urology has been consult. -Continue broad-spectrum IV antibiotics with the ceftriaxone and Levaquin. IR to place a nephrostomy tube later today. Cont. further care as per urology. Patient likely needs a nephrectomy at a later point. -Continue Flomax, finasteride  3. BPH-continue Flomax, finasteride.  4. Essential hypertension-continue lisinopril.  5. Hyperlipidemia-continue simvastatin.  6. GERD-continue Protonix.    All the records are reviewed and case discussed with ED provider. Management plans discussed with the patient, family and they are in agreement.  CODE STATUS: Full  TOTAL TIME TAKING CARE OF THIS PATIENT: 45 minutes.    Henreitta Leber M.D on 09/29/2015 at 8:49 PM  Between 7am to 6pm - Pager - [phone]  After 6pm go to [email or web address] - password [email or web address]  Office  [phone]  CC: Primary care physician; Tracie Harrier, MD

## 2015-09-29 NOTE — ED Triage Notes (Signed)
Pt has enlarged kidney per wife and currently has urine infection. Has catheter in place. Has been a little confused; having trouble remembering things. Fever 102. Per wife they may have to drain kidney.

## 2015-09-29 NOTE — ED Notes (Signed)
Nurse spoke to Dr. Marcelene Butte

## 2015-09-29 NOTE — Consult Note (Signed)
Urology Consult  I have been asked to see the patient by Dr. Marcelene Butte, for evaluation and management of severe left hydronephrosis, fevers.  Chief Complaint: fevers  History of Present Illness: Craig [name] is a 70 y.o. year old with a history of BPH with urinary retention and high grade left UPJ obstruction who was admitted earlier this week with leukocytosis, fevers and urinary symptoms. CT scan revealed chronic severe left hydronephrotic kidney consistent with his known left UPJ obstruction with some perinephric stranding. Urine culture ultimately grew enterococcus sensitive to Levaquin. He was treated conservatively throughout that admission with IV fluids and antibiotics, remained hemodynamically stable with a fairly benign temperature curve and was ultimately discharged with a Foley catheter in place after failing a voiding trial.  Early this afternoon, he called our office reporting a fever to 102 and overall malaise. He is brought back to the emergency room today by his wife.  He is indeed febrile to 102 but otherwise normotensive and not tachycardic. No significant leukocytosis.  UA is fairly unremarkable other than for microscopic blood.  His creatinine is mildly elevated to 1.1.  Foley catheter remains in place. He complains of some discomfort from the Foley in "urinary burning."   Past Medical History:  Diagnosis Date  . BPH (benign prostatic hyperplasia)   . Hypercholesterolemia   . Hypertension     Past Surgical History:  Procedure Laterality Date  . REPLACEMENT TOTAL KNEE Right     Home Medications:  Current Meds  Medication Sig  . acetaminophen (TYLENOL 8 HOUR ARTHRITIS PAIN) 650 MG CR tablet Take 650 mg by mouth every 8 (eight) hours as needed for pain.  . cetirizine (ZYRTEC) 10 MG tablet Take 10 mg by mouth daily.  Marland Kitchen docusate sodium (COLACE) 100 MG capsule Take 100 mg by mouth 2 (two) times daily.  . finasteride (PROSCAR) 5 MG tablet Take 5 mg by mouth  daily.  . fluticasone (FLONASE) 50 MCG/ACT nasal spray Place 2 sprays into both nostrils daily.  Marland Kitchen levofloxacin (LEVAQUIN) 250 MG tablet Take 1 tablet (250 mg total) by mouth daily.  Marland Kitchen lisinopril (PRINIVIL,ZESTRIL) 30 MG tablet Take 30 mg by mouth daily.  . Multiple Vitamin (MULTIVITAMIN) tablet Take 1 tablet by mouth daily.  Marland Kitchen omeprazole (PRILOSEC) 40 MG capsule Take 40 mg by mouth 2 (two) times daily.  . simvastatin (ZOCOR) 40 MG tablet Take 40 mg by mouth daily.  . tamsulosin (FLOMAX) 0.4 MG CAPS capsule Take 0.4 mg by mouth daily.  . traMADol (ULTRAM) 50 MG tablet Take 50 mg by mouth 3 (three) times daily.  . vitamin E 400 UNIT capsule Take 400 Units by mouth daily.    Allergies:  Allergies  Allergen Reactions  . Peanut Oil Other (See Comments)    Mouth ulcers    History reviewed. No pertinent family history.  Social History:  reports that he quit smoking about 32 years ago. He has never used smokeless tobacco. His alcohol and drug histories are not on file.  ROS: A complete review of systems was performed.  All systems are negative except for pertinent findings as noted.  Physical Exam:  Vital signs in last 24 hours: Temp:  [102 F (38.9 C)] 102 F (38.9 C) (08/11 1741) Pulse Rate:  [78-95] 89 (08/11 1930) Resp:  [18-20] 19 (08/11 1930) BP: (156-177)/(81-96) 177/96 (08/11 1930) SpO2:  [93 %-98 %] 96 % (08/11 1930) Weight:  [230 lb (104.3 kg)] 230 lb (104.3 kg) (08/11 1741) Constitutional:  Alert and oriented, No acute distress HEENT: [place] AT, moist mucus membranes.  Trachea midline, no masses Cardiovascular: Regular rate and rhythm, no clubbing, cyanosis, or edema. Respiratory: Normal respiratory effort, lungs clear bilaterally GI: Abdomen is soft, nontender, nondistended, no abdominal masses GU: Mild left CVA tenderness. Left flank and abdomen significantly full compared to right. Foley catheter in place draining clear yellow urine. Skin: No rashes, bruises or suspicious  lesions Lymph: No cervical or inguinal adenopathy Neurologic: Grossly intact, no focal deficits, moving all 4 extremities Psychiatric: Normal mood and affect   Laboratory Data:   Recent Labs  09/29/15 1750  WBC 9.3  HGB 11.3*  HCT 32.7*    Recent Labs  09/29/15 1750  NA 132*  K 4.8  CL 103  CO2 24  GLUCOSE 124*  BUN 16  CREATININE 1.10  CALCIUM 8.1*   No results for input(s): LABPT, INR in the last 72 hours. No results for input(s): LABURIN in the last 72 hours. Results for orders placed or performed during the hospital encounter of 09/25/15  Blood Culture (routine x 2)     Status: None (Preliminary result)   Collection Time: 09/25/15  9:57 AM  Result Value Ref Range Status   Specimen Description BLOOD LEFT AC IV  Final   Special Requests   Final    BOTTLES DRAWN AEROBIC AND ANAEROBIC ANA 10ML AER 12ML   Culture NO GROWTH 4 DAYS  Final   Report Status PENDING  Incomplete  Urine culture     Status: Abnormal   Collection Time: 09/25/15 10:01 AM  Result Value Ref Range Status   Specimen Description URINE, RANDOM  Final   Special Requests NONE  Final   Culture >=100,000 COLONIES/mL ENTEROCOCCUS SPECIES (A)  Final   Report Status 09/27/2015 FINAL  Final   Organism ID, Bacteria ENTEROCOCCUS SPECIES (A)  Final      Susceptibility   Enterococcus species - MIC*    AMPICILLIN <=2 SENSITIVE Sensitive     LEVOFLOXACIN 0.5 SENSITIVE Sensitive     NITROFURANTOIN <=16 SENSITIVE Sensitive     VANCOMYCIN 1 SENSITIVE Sensitive     * >=100,000 COLONIES/mL ENTEROCOCCUS SPECIES     Radiologic Imaging: Dg Chest Port 1 View  Result Date: 09/29/2015 CLINICAL DATA:  Fever EXAM: PORTABLE CHEST 1 VIEW COMPARISON:  09/25/2015 chest radiograph. FINDINGS: Stable cardiomediastinal silhouette with normal heart size. No pneumothorax. No pleural effusion. No pulmonary edema. Mild hazy opacity at the left lung base. IMPRESSION: Mild hazy left lung base opacity, favor atelectasis, cannot  exclude mild aspiration or developing pneumonia. Recommend follow-up PA and lateral post treatment chest radiographs in 4-6 weeks. Electronically Signed   By: Ilona Sorrel M.D.   On: 09/29/2015 18:39   Previous CT scan was reviewed personally today.  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 At this point, I would consider him have to have failed conservative management and recommend drainage of this renal unit.  Options including percutaneous nephrostomy tube as well as stent were considered. Given the capacious renal pelvis and high-grade obstruction, feel that a ureteral stent would likely fail in this setting and not drain the more dependent aspect of the kidney. As such, I would recommend placement of a percutaneous nephrostomy tube. This was discussed personally with both Dr. Marcelene Butte as well as Dr. Gena Fray from interventional radiology for agreeable with this plan.  1) Left UPJ obstruction High-grade obstruction,  in setting of refractory fevers plan for percutaneous nephrostomy tube this evening Will ultimately need nephrectomy  2) Fevers Continue abx, levaquin and ceftriaxone Recommend UCx from perc tube upon placement  3) BPH with urinary retention Maintain foley, consider repeat VT prior to discharge Continue flomax/ finasteride  4) Acute kidney injury Slight increase in Cr, likely 2/2 dehdration  09/29/2015, 7:42 PM  Hollice Espy,  MD

## 2015-09-29 NOTE — Telephone Encounter (Signed)
He can buy pyridium OTC if he wants but this does not generally help with a catheter in place.    Hollice Espy, MD

## 2015-09-29 NOTE — ED Triage Notes (Signed)
Wife reports nose bleed X 2 today

## 2015-09-29 NOTE — ED Notes (Signed)
Gave pt another pillow

## 2015-09-29 NOTE — ED Provider Notes (Addendum)
Time Seen: Approximately 1818 I have reviewed the triage notes  Chief Complaint: Blood Infection   History of Present Illness: Craig [name] is a 70 y.o. male who presents after discharge from the hospital with fever. Patient states he was doing well at home and had been taking the prescription Levaquin. Patient has a known history of a nondraining left-sided kidney with left-sided hydronephrosis. She was advised by the urologist to come back to the emergency department when he developed a temperature earlier today. He had 2 extra strength Tylenol prior to arrival. His temperature on arrival here is 102. Patient was initiated on a septic workup.   Past Medical History:  Diagnosis Date  . BPH (benign prostatic hyperplasia)   . Hypercholesterolemia   . Hypertension     Patient Active Problem List   Diagnosis Date Noted  . Acute pyelonephritis 09/25/2015    Past Surgical History:  Procedure Laterality Date  . REPLACEMENT TOTAL KNEE Right     Past Surgical History:  Procedure Laterality Date  . REPLACEMENT TOTAL KNEE Right     Current Outpatient Rx  . Order #: [id number] Class: Historical Med  . Order #: [id number] Class: Historical Med  . Order #: [id number] Class: Historical Med  . Order #: [id number] Class: Historical Med  . Order #: [id number] Class: Historical Med  . Order #: [id number] Class: Normal  . Order #: [id number] Class: Historical Med  . Order #: [id number] Class: Historical Med  . Order #: [id number] Class: Historical Med  . Order #: [id number] Class: Historical Med  . Order #: [id number] Class: Historical Med  . Order #: [id number] Class: Historical Med  . Order #: [id number] Class: Historical Med    Allergies:  Peanut oil  Family History: History reviewed. No pertinent family history.  Social History: Social History  Substance Use Topics  . Smoking status: Former Smoker    Quit date: 09/25/1983  . Smokeless tobacco: Never Used  . Alcohol use Not on file      Review of Systems:   10 point review of systems was performed and was otherwise negative:  Constitutional: No fever Eyes: No visual disturbances ENT: No sore throat, ear pain Cardiac: No chest pain Respiratory: No shortness of breath, wheezing, or stridor Abdomen: Mild left flank pain, no abdominal pain, no vomiting, No diarrhea Endocrine: No weight loss, No night sweats Extremities: No peripheral edema, cyanosis Skin: No rashes, easy bruising Neurologic: No focal weakness, trouble with speech or swollowing Urologic: Patient has a draining Foley catheter.  Physical Exam:  ED Triage Vitals [09/29/15 1741]  Enc Vitals Group     BP (!) 168/89     Pulse Rate 95     Resp 20     Temp (!) 102 F (38.9 C)     Temp Source Oral     SpO2 93 %     Weight 230 lb (104.3 kg)     Height 5\' 11"  (1.803 m)     Head Circumference      Peak Flow      Pain Score      Pain Loc      Pain Edu?      Excl. in [place]?     General: Awake , Alert , and Oriented times 3; GCS 15 Head: Normal cephalic , atraumatic Eyes: Pupils equal , round, reactive to light Nose/Throat: No nasal drainage, patent upper airway without erythema or exudate.  Neck: Supple, Full range of motion, No anterior adenopathy or palpable thyroid masses Lungs: Diminished breath sounds at the left base  otherwise no wheezes rhonchi's Rales Heart: Regular rate, regular rhythm without murmurs , gallops , or rubs Abdomen: Soft, non tender without rebound, guarding , or rigidity; bowel sounds positive and symmetric in all 4 quadrants. No organomegaly .        Extremities: 2 plus symmetric pulses. No edema, clubbing or cyanosis Neurologic: normal ambulation, Motor symmetric without deficits, sensory intact Skin: warm, dry, no rashes   Labs:   All laboratory work was reviewed including any pertinent negatives or positives listed below:  Labs Reviewed  COMPREHENSIVE METABOLIC PANEL - Abnormal; Notable for the following:        Result Value   Sodium 132 (*)    Glucose, Bld 124 (*)    Calcium 8.1 (*)    Total Protein 6.1 (*)    Albumin 2.6 (*)    All other components within normal limits  CBC WITH DIFFERENTIAL/PLATELET - Abnormal; Notable for the following:    RBC 3.58 (*)    Hemoglobin 11.3 (*)    HCT 32.7 (*)    Monocytes Absolute 1.7 (*)    All other components within normal limits  URINALYSIS COMPLETEWITH MICROSCOPIC ([facility] ONLY) - Abnormal; Notable for the following:    Color, Urine AMBER (*)    APPearance CLEAR (*)    Hgb urine dipstick 2+ (*)    Protein, ur 100 (*)    Bacteria, UA RARE (*)    Squamous Epithelial / LPF 0-5 (*)    All other components within normal limits  CULTURE, BLOOD (ROUTINE X 2)  CULTURE, BLOOD (ROUTINE X 2)  URINE CULTURE  LACTIC ACID, PLASMA  LACTIC ACID, PLASMA  Patient has urine and blood cultures pending. Initial lactic acid levels were negative. Renal function appears to be stable  EKG: * ED ECG REPORT I, Daymon Larsen, the attending physician, personally viewed and interpreted this ECG.  Date: 09/29/2015 EKG Time: *1825 Rate: 78 Rhythm: normal sinus rhythm QRS Axis: normal Intervals: normal ST/T Wave abnormalities: normal Conduction Disturbances: none Narrative Interpretation: unremarkable No acute ischemic changes   Radiology:  CLINICAL DATA:  Fever  EXAM: PORTABLE CHEST 1 VIEW  COMPARISON:  09/25/2015 chest radiograph.  FINDINGS: Stable cardiomediastinal silhouette with normal heart size. No pneumothorax. No pleural effusion. No pulmonary edema. Mild hazy opacity at the left lung base.  IMPRESSION: Mild hazy left lung base opacity, favor atelectasis, cannot exclude mild aspiration or developing pneumonia. Recommend follow-up PA and lateral post treatment chest radiographs in 4-6 weeks.   Electronically Signed   By: Ilona Sorrel M.D.   On: 09/29/2015 18:39      I personally reviewed the radiologic studies    ED Course:   Patient had a septic workup and code sepsis was established essentially for timing. He does not appear to be in septic shock in his blood pressures are stable at this time and was placed on KVO fluid status. He has no altered mental status at this time. Patient had his case reviewed with Dr. Erlene Quan who was the urologist who referred the patient. She has requested a left-sided percutaneous nephrostomy drainage of his left kidney for what has a suction of pyelonephritis causing his fever. His chest x-ray is most likely atelectasis. I felt the patient did not require IV fluid boluses and appears to be otherwise hemodynamically stable and was initiated on IV Rocephin since she been on Levaquin. His current urinalysis only indicates red blood cells with no signs of infection at this time with urine culture pending  Clinical Course     Assessment:  Acute left flank pain Likely pyelonephritis with left-sided hydronephrosis   Final Clinical Impression:  Final diagnoses:  Acute left flank pain  Pyelonephritis, acute     Plan:  Left-sided percutaneous nephrostomy drainage Inpatient management            Daymon Larsen, MD 09/29/15 1931    Daymon Larsen, MD 09/29/15 2042

## 2015-09-29 NOTE — Sedation Documentation (Signed)
Family updated as to patient's status; transporting w/ pt to floor.

## 2015-09-29 NOTE — ED Notes (Signed)
Pt taken to Special procedures.

## 2015-09-29 NOTE — ED Notes (Signed)
Nurse spoke to Dr. Marcelene Butte about acetaminophen po for pt. Per pt's wife pt had "two extra strength tylenol" at approximately 1630 today. Dr. Marcelene Butte asked nurse to hold dose for at this time.

## 2015-09-29 NOTE — ED Notes (Signed)
Dr Nile Riggs notified of pt

## 2015-09-29 NOTE — Sedation Documentation (Signed)
Patient is resting comfortably. 

## 2015-09-29 NOTE — Procedures (Signed)
L PCN 10 Fr No comp/EBL 

## 2015-09-30 ENCOUNTER — Encounter: Payer: Self-pay | Admitting: Interventional Radiology

## 2015-09-30 DIAGNOSIS — N1330 Unspecified hydronephrosis: Secondary | ICD-10-CM

## 2015-09-30 DIAGNOSIS — R509 Fever, unspecified: Secondary | ICD-10-CM

## 2015-09-30 LAB — CULTURE, BLOOD (ROUTINE X 2): Culture: NO GROWTH

## 2015-09-30 LAB — URINE CULTURE: CULTURE: NO GROWTH

## 2015-09-30 LAB — BASIC METABOLIC PANEL
ANION GAP: 7 (ref 5–15)
BUN: 13 mg/dL (ref 6–20)
CALCIUM: 8 mg/dL — AB (ref 8.9–10.3)
CO2: 26 mmol/L (ref 22–32)
CREATININE: 1.14 mg/dL (ref 0.61–1.24)
Chloride: 105 mmol/L (ref 101–111)
GLUCOSE: 114 mg/dL — AB (ref 65–99)
Potassium: 3.6 mmol/L (ref 3.5–5.1)
Sodium: 138 mmol/L (ref 135–145)

## 2015-09-30 LAB — CBC
HCT: 31.9 % — ABNORMAL LOW (ref 40.0–52.0)
Hemoglobin: 11.2 g/dL — ABNORMAL LOW (ref 13.0–18.0)
MCH: 32.1 pg (ref 26.0–34.0)
MCHC: 35.2 g/dL (ref 32.0–36.0)
MCV: 91.2 fL (ref 80.0–100.0)
PLATELETS: 166 10*3/uL (ref 150–440)
RBC: 3.5 MIL/uL — AB (ref 4.40–5.90)
RDW: 13.8 % (ref 11.5–14.5)
WBC: 8.4 10*3/uL (ref 3.8–10.6)

## 2015-09-30 NOTE — Plan of Care (Signed)
Problem: Physical Regulation: Goal: Will remain free from infection Outcome: Not Progressing Currently being treated for Urinary infection. Foley catheter in place, draining per gravity. Nephrostomy tube to left side, draining brown milky output. Patient is currently receiving IV antibiotics.

## 2015-09-30 NOTE — Progress Notes (Signed)
[place] at [place] NAME: Craig [name]    MR#:  [id number]  DATE OF BIRTH:  1945-05-27  SUBJECTIVE:   Pt. Here due to sepsis from L UPJ obstruction.  S/p Percutaneous Nephrostomy yesterday.  Still having some left flank pain.  Fever resolved. BC so far (-).  Family at bedside.   REVIEW OF SYSTEMS:    Review of Systems  Constitutional: Negative for chills and fever.  HENT: Negative for congestion and tinnitus.   Eyes: Negative for blurred vision and double vision.  Respiratory: Negative for cough, shortness of breath and wheezing.   Cardiovascular: Negative for chest pain, orthopnea and PND.  Gastrointestinal: Positive for abdominal pain (Left Flank). Negative for diarrhea, nausea and vomiting.  Genitourinary: Negative for dysuria and hematuria.  Neurological: Negative for dizziness, sensory change and focal weakness.  All other systems reviewed and are negative.   Nutrition: Heart Healthy Tolerating Diet: Yes Tolerating PT: Ambulatory   DRUG ALLERGIES:   Allergies  Allergen Reactions  . Peanut Oil Other (See Comments)    Mouth ulcers    VITALS:  Blood pressure (!) 160/90, pulse 98, temperature 98.8 F (37.1 C), temperature source Oral, resp. rate 16, height 5\' 11"  (1.803 m), weight 108.9 kg (240 lb), SpO2 98 %.  PHYSICAL EXAMINATION:   Physical Exam  GENERAL:  70 y.o.-year-old patient lying in the bed in no acute distress.  EYES: Pupils equal, round, reactive to light and accommodation. No scleral icterus. Extraocular muscles intact.  HEENT: Head atraumatic, normocephalic. Oropharynx and nasopharynx clear.  NECK:  Supple, no jugular venous distention. No thyroid enlargement, no tenderness.  LUNGS: Normal breath sounds bilaterally, no wheezing, rales, rhonchi. No use of accessory muscles of respiration.  CARDIOVASCULAR: S1, S2 normal. No murmurs, rubs, or gallops.  ABDOMEN: Soft, nontender, nondistended. Bowel sounds  present. No organomegaly or mass. Left sided Perc. Nephrostomy tube placed.   EXTREMITIES: No cyanosis, clubbing or edema b/l.    NEUROLOGIC: Cranial nerves II through XII are intact. No focal Motor or sensory deficits b/l.   PSYCHIATRIC: The patient is alert and oriented x 3.  SKIN: No obvious rash, lesion, or ulcer.   GU - foley cath in place with yellow urine draining.   LABORATORY PANEL:   CBC  Recent Labs Lab 09/30/15 0532  WBC 8.4  HGB 11.2*  HCT 31.9*  PLT 166   ------------------------------------------------------------------------------------------------------------------  Chemistries   Recent Labs Lab 09/29/15 1750 09/30/15 0532  NA 132* 138  K 4.8 3.6  CL 103 105  CO2 24 26  GLUCOSE 124* 114*  BUN 16 13  CREATININE 1.10 1.14  CALCIUM 8.1* 8.0*  AST 28  --   ALT 19  --   ALKPHOS 63  --   BILITOT 1.1  --    ------------------------------------------------------------------------------------------------------------------  Cardiac Enzymes No results for input(s): TROPONINI in the last 168 hours. ------------------------------------------------------------------------------------------------------------------  RADIOLOGY:  [place] Intraoperative  Result Date: 09/30/2015 CLINICAL DATA:  Ultrasound was provided for use by the ordering physician, and a technical charge was applied by the performing facility.  No radiologist interpretation/professional services rendered.   Dg Chest Port 1 View  Result Date: 09/29/2015 CLINICAL DATA:  Fever EXAM: PORTABLE CHEST 1 VIEW COMPARISON:  09/25/2015 chest radiograph. FINDINGS: Stable cardiomediastinal silhouette with normal heart size. No pneumothorax. No pleural effusion. No pulmonary edema. Mild hazy opacity at the left lung base. IMPRESSION: Mild hazy left lung base opacity, favor atelectasis, cannot exclude mild aspiration or developing  pneumonia. Recommend follow-up PA and lateral post treatment chest radiographs in 4-6  weeks. Electronically Signed   By: Ilona Sorrel M.D.   On: 09/29/2015 18:39   Ir Nephrostomy Placement Left  Result Date: 09/30/2015 INDICATION: Left hydronephrosis.  Fever. EXAM: IR NEPHROSTOMY PLACEMENT LEFT COMPARISON:  None. MEDICATIONS: None.  The patient was given Rocephin previously. ANESTHESIA/SEDATION: Fentanyl 50 mcg IV; Versed 1 mg IV Moderate Sedation Time:  30 The patient was continuously monitored during the procedure by the interventional radiology nurse under my direct supervision. CONTRAST:  63mL ISOVUE-300 IOPAMIDOL (ISOVUE-300) INJECTION 61% - administered into the collecting system(s) FLUOROSCOPY TIME:  Fluoroscopy Time:  minutes 42 seconds (15 mGy). COMPLICATIONS: None immediate. PROCEDURE: Informed written consent was obtained from the patient after a thorough discussion of the procedural risks, benefits and alternatives. All questions were addressed. Maximal Sterile Barrier Technique was utilized including caps, mask, sterile gowns, sterile gloves, sterile drape, hand hygiene and skin antiseptic. A timeout was performed prior to the initiation of the procedure. The back was prepped and draped in a sterile fashion. 1% lidocaine was utilized for local anesthesia. Under sonographic guidance, a 21 gauge needle was inserted into a dilated posterior mid left renal calyx. Contrast was injected opacifying the collecting system. The needle was removed over a 018 wire which was up sized to a 3 J. A 10 French dilator followed by a 10 French nephrostomy were inserted. The catheter was looped and string fixed in the renal pelvis. Contrast was injected. Frank pus was aspirated. A sample was sent for culture. FINDINGS: Imaging confirms placement of a 10 French left nephrostomy. IMPRESSION: Successful left percutaneous nephrostomy catheter placement. Electronically Signed   By: [name] M.D.   On: 09/30/2015 09:11     ASSESSMENT AND PLAN:   70 year old male with past medical history of  hypertension, GERD, lipidemia, BPH and recent admission for acute left-sided pyelonephritis with high-grade UPJ obstruction who returns to the hospital due to high fevers.  1. Sepsis-patient presents to the hospital with high fevers, tachycardia and recent diagnosis of acute pyelonephritis. -This is likely the source of patient's sepsis given his high-grade UPJ obstruction -Continue IV ceftriaxone, Levaquin. Cultures so far negative. -Patient is status post left-sided percutaneous nephrostomy tube placement.  2. 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 Appreciate urology and INR help. -Continue IV ceftriaxone, Levaquin.   3. BPH-continue Flomax, finasteride. -Continue Foley, will discuss with urology about possibly removing Foley prior to discharge.  4. Essential hypertension-continue lisinopril.  5. Hyperlipidemia-continue simvastatin.  6. GERD-continue Protonix.    All the records are reviewed and case discussed with Care Management/Social Workerr. Management plans discussed with the patient, family and they are in agreement.  CODE STATUS: Full  DVT Prophylaxis: Lovenox  TOTAL TIME TAKING CARE OF THIS PATIENT: 30 minutes.   POSSIBLE D/C IN 2-3 DAYS, DEPENDING ON CLINICAL CONDITION.   Henreitta Leber M.D on 09/30/2015 at 1:55 PM  Between 7am to 6pm - Pager - [phone]  After 6pm go to [email or web address] - password [email or web address] [facility]  Office  [phone]  CC: Primary care physician; Tracie Harrier, MD

## 2015-09-30 NOTE — Progress Notes (Addendum)
  Pt sleeping. I spoke with his wife. She reports he is feeling much better. No fever today.  NAD Lt Nx with clear urine Foley - clear urine   CBC    Component Value Date/Time   WBC 8.4 09/30/2015 0532   RBC 3.50 (L) 09/30/2015 0532   HGB 11.2 (L) 09/30/2015 0532   HGB 11.6 (L) 05/18/2014 0456   HCT 31.9 (L) 09/30/2015 0532   HCT 42.3 02/10/2012 1055   PLT 166 09/30/2015 0532   PLT 125 (L) 05/18/2014 0456   MCV 91.2 09/30/2015 0532   MCV 93 02/10/2012 1055   MCH 32.1 09/30/2015 0532   MCHC 35.2 09/30/2015 0532   RDW 13.8 09/30/2015 0532   RDW 13.3 02/10/2012 1055   LYMPHSABS 1.1 09/29/2015 1750   MONOABS 1.7 (H) 09/29/2015 1750   EOSABS 0.1 09/29/2015 1750   BASOSABS 0.0 09/29/2015 1750   BMET    Component Value Date/Time   NA 138 09/30/2015 0532   NA 135 05/18/2014 0456   K 3.6 09/30/2015 0532   K 4.0 05/18/2014 0456   CL 105 09/30/2015 0532   CL 101 05/18/2014 0456   CO2 26 09/30/2015 0532   CO2 28 05/18/2014 0456   GLUCOSE 114 (H) 09/30/2015 0532   GLUCOSE 123 (H) 05/18/2014 0456   BUN 13 09/30/2015 0532   BUN 14 05/18/2014 0456   CREATININE 1.14 09/30/2015 0532   CREATININE 0.96 05/18/2014 0456   CALCIUM 8.0 (L) 09/30/2015 0532   CALCIUM 8.2 (L) 05/18/2014 0456   GFRNONAA >60 09/30/2015 0532   GFRNONAA >60 05/18/2014 0456   GFRAA >60 09/30/2015 0532   GFRAA >60 05/18/2014 0456   I reviewed CT and IR images.   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 Patient stable. So far cultures negative. Prior culture grew enterococcus and he could likely be discharged with by mouth Levaquin if he remains stable. He will need to follow-up with Dr. Erlene Quan to plan left nephrectomy. His wife told me this was the plan.  Can d/c foley from GU pt of view.

## 2015-09-30 NOTE — Care Management Important Message (Signed)
Important Message  Patient Details  Name: Craig [name] MRN: [id number] Date of Birth: 25-Sep-1945   Medicare Important Message Given:  Yes    Sadeen Wiegel A, RN 09/30/2015, 4:16 PM

## 2015-10-01 MED ORDER — LEVOFLOXACIN 250 MG PO TABS: 250.0000 mg | ORAL_TABLET | Freq: Every day | ORAL | 0 refills | Status: DC

## 2015-10-01 MED ORDER — LEVOFLOXACIN 500 MG PO TABS: 500.0000 mg | ORAL_TABLET | Freq: Every day | ORAL | Status: DC

## 2015-10-01 NOTE — Discharge Summary (Signed)
[place] at [place] NAME: Craig [name]    MR#:  [id number]  DATE OF BIRTH:  [birth year]  DATE OF ADMISSION:  09/29/2015 ADMITTING PHYSICIAN: Henreitta Leber, MD  DATE OF DISCHARGE: 10/01/2015  PRIMARY CARE PHYSICIAN: Tracie Harrier, MD    ADMISSION DIAGNOSIS:  Pyelonephritis, acute [N10] Hydronephrosis of left kidney [N13.30] Acute left flank pain [R10.12]  DISCHARGE DIAGNOSIS:  Active Problems:   Sepsis ([place])   SECONDARY DIAGNOSIS:   Past Medical History:  Diagnosis Date  . BPH (benign prostatic hyperplasia)   . Hypercholesterolemia   . Hypertension     HOSPITAL COURSE:   70 year old male with past medical history of hypertension, GERD, lipidemia, BPH and recent admission for acute left-sided pyelonephritis with high-grade UPJ obstruction who returns to the hospital due to high fevers.  1. Sepsis-patient presents to the hospital with high fevers, tachycardia and recent diagnosis of acute pyelonephritis. -The patient's source of sepsis was the high-grade left-sided UPJ obstruction. -Patient was admitted to the hospital and empirically started on IV ceftriaxone, Levaquin. His blood and urine cultures have remained negative. He was seen by urology and also interventional radiology and underwent percutaneous left-sided nephrostomy tube placement. -His fevers have resolved, his white cell count is stable. His symptoms have improved. He is now being discharged on oral Levaquin and follow-up with urology as an outpatient.  2. High-grade left-sided UPJ obstruction-patient was seen by interventional radiology and urology and is status post left-sided percutaneous nephrostomy tube placement. -His fevers has resolved and his pain has improved. He is not being discharged on oral Levaquin, with urology follow-up as he will likely need a left-sided nephrectomy.  3. BPH-continue Flomax, finasteride. -Discussed with urology this a.m. and  Foley has been removed. If he is not able to void prior to discharge we'll discuss with radiology regarding plans of either replacing the Foley or follow-up with them as outpatient.  4. Essential hypertension-he will continue lisinopril.  5. Hyperlipidemia- he will continue simvastatin.  6. GERD- he will continue Omeprazole.   DISCHARGE CONDITIONS:   Stable  CONSULTS OBTAINED:  Treatment Team:  Hollice Espy, MD  DRUG ALLERGIES:   Allergies  Allergen Reactions  . Peanut Oil Other (See Comments)    Mouth ulcers    DISCHARGE MEDICATIONS:     Medication List    TAKE these medications   cetirizine 10 MG tablet Commonly known as:  ZYRTEC Take 10 mg by mouth daily.   docusate sodium 100 MG capsule Commonly known as:  COLACE Take 100 mg by mouth 2 (two) times daily.   finasteride 5 MG tablet Commonly known as:  PROSCAR Take 5 mg by mouth daily.   fluticasone 50 MCG/ACT nasal spray Commonly known as:  FLONASE Place 2 sprays into both nostrils daily.   levofloxacin 250 MG tablet Commonly known as:  LEVAQUIN Take 1 tablet (250 mg total) by mouth daily.   lisinopril 30 MG tablet Commonly known as:  PRINIVIL,ZESTRIL Take 30 mg by mouth daily.   multivitamin tablet Take 1 tablet by mouth daily.   omeprazole 40 MG capsule Commonly known as:  PRILOSEC Take 40 mg by mouth 2 (two) times daily.   simvastatin 40 MG tablet Commonly known as:  ZOCOR Take 40 mg by mouth daily.   tamsulosin 0.4 MG Caps capsule Commonly known as:  FLOMAX Take 0.4 mg by mouth daily.   traMADol 50 MG tablet Commonly known as:  ULTRAM Take 50 mg by mouth 3 (three)  times daily.   TYLENOL 8 HOUR ARTHRITIS PAIN 650 MG CR tablet Generic drug:  acetaminophen Take 650 mg by mouth every 8 (eight) hours as needed for pain.   vitamin E 400 UNIT capsule Take 400 Units by mouth daily.         DISCHARGE INSTRUCTIONS:   DIET:  Cardiac diet  DISCHARGE CONDITION:   Stable  ACTIVITY:  Activity as tolerated  OXYGEN:  Home Oxygen: No.   Oxygen Delivery: room air  DISCHARGE LOCATION:  home   If you experience worsening of your admission symptoms, develop shortness of breath, life threatening emergency, suicidal or homicidal thoughts you must seek medical attention immediately by calling 911 or calling your MD immediately  if symptoms less severe.  You Must read complete instructions/literature along with all the possible adverse reactions/side effects for all the Medicines you take and that have been prescribed to you. Take any new Medicines after you have completely understood and accpet all the possible adverse reactions/side effects.   Please note  You were cared for by a hospitalist during your hospital stay. If you have any questions about your discharge medications or the care you received while you were in the hospital after you are discharged, you can call the unit and asked to speak with the hospitalist on call if the hospitalist that took care of you is not available. Once you are discharged, your primary care physician will handle any further medical issues. Please note that NO REFILLS for any discharge medications will be authorized once you are discharged, as it is imperative that you return to your primary care physician (or establish a relationship with a primary care physician if you do not have one) for your aftercare needs so that they can reassess your need for medications and monitor your lab values.     Today   Abdominal pain improved.  No fever overnight.  No other complaints.  NO N/V.   VITAL SIGNS:  Blood pressure (!) 165/83, pulse 79, temperature 98.8 F (37.1 C), temperature source Oral, resp. rate 18, height 5\' 11"  (1.803 m), weight 108.9 kg (240 lb), SpO2 97 %.  I/O:   Intake/Output Summary (Last 24 hours) at 10/01/15 1227 Last data filed at 10/01/15 0930  Gross per 24 hour  Intake             2084 ml  Output              1875 ml  Net              209 ml    PHYSICAL EXAMINATION:   GENERAL:  70 y.o.-year-old patient lying in the bed in no acute distress.  EYES: Pupils equal, round, reactive to light and accommodation. No scleral icterus. Extraocular muscles intact.  HEENT: Head atraumatic, normocephalic. Oropharynx and nasopharynx clear.  NECK:  Supple, no jugular venous distention. No thyroid enlargement, no tenderness.  LUNGS: Normal breath sounds bilaterally, no wheezing, rales, rhonchi. No use of accessory muscles of respiration.  CARDIOVASCULAR: S1, S2 normal. No murmurs, rubs, or gallops.  ABDOMEN: Soft, nontender, nondistended. Bowel sounds present. No organomegaly or mass. Left sided Perc. Nephrostomy tube placed.   EXTREMITIES: No cyanosis, clubbing or edema b/l.    NEUROLOGIC: Cranial nerves II through XII are intact. No focal Motor or sensory deficits b/l.   PSYCHIATRIC: The patient is alert and oriented x 3.  SKIN: No obvious rash, lesion, or ulcer.   GU - foley cath in place with yellow  urine draining.   DATA REVIEW:   CBC  Recent Labs Lab 09/30/15 0532  WBC 8.4  HGB 11.2*  HCT 31.9*  PLT 166    Chemistries   Recent Labs Lab 09/29/15 1750 09/30/15 0532  NA 132* 138  K 4.8 3.6  CL 103 105  CO2 24 26  GLUCOSE 124* 114*  BUN 16 13  CREATININE 1.10 1.14  CALCIUM 8.1* 8.0*  AST 28  --   ALT 19  --   ALKPHOS 63  --   BILITOT 1.1  --     Cardiac Enzymes No results for input(s): TROPONINI in the last 168 hours.  Microbiology Results  Results for orders placed or performed during the hospital encounter of 09/29/15  Culture, blood (Routine x 2)     Status: None (Preliminary result)   Collection Time: 09/29/15  5:50 PM  Result Value Ref Range Status   Specimen Description BLOOD RIGHT WRIST  Final   Special Requests   Final    BOTTLES DRAWN AEROBIC AND ANAEROBIC [place]   Culture NO GROWTH 2 DAYS  Final   Report Status PENDING  Incomplete  Urine culture      Status: None   Collection Time: 09/29/15  5:50 PM  Result Value Ref Range Status   Specimen Description URINE, RANDOM  Final   Special Requests NONE  Final   Culture NO GROWTH Performed at [facility]   Final   Report Status 09/30/2015 FINAL  Final  Culture, blood (Routine x 2)     Status: None (Preliminary result)   Collection Time: 09/29/15  5:51 PM  Result Value Ref Range Status   Specimen Description BLOOD LEFT HAND  Final   Special Requests BOTTLES DRAWN AEROBIC AND ANAEROBIC [place]  Final   Culture NO GROWTH 2 DAYS  Final   Report Status PENDING  Incomplete  Body fluid culture     Status: None (Preliminary result)   Collection Time: 09/29/15 10:25 PM  Result Value Ref Range Status   Specimen Description FLUID LEFT NEPHROSTOMY  Final   Special Requests NONE  Final   Gram Stain   Final    ABUNDANT WBC PRESENT,BOTH PMN AND MONONUCLEAR NO ORGANISMS SEEN    Culture   Final    NO GROWTH 1 DAY Performed at [facility]    Report Status PENDING  Incomplete    RADIOLOGY:  [place] Intraoperative  Result Date: 09/30/2015 CLINICAL DATA:  Ultrasound was provided for use by the ordering physician, and a technical charge was applied by the performing facility.  No radiologist interpretation/professional services rendered.   Dg Chest Port 1 View  Result Date: 09/29/2015 CLINICAL DATA:  Fever EXAM: PORTABLE CHEST 1 VIEW COMPARISON:  09/25/2015 chest radiograph. FINDINGS: Stable cardiomediastinal silhouette with normal heart size. No pneumothorax. No pleural effusion. No pulmonary edema. Mild hazy opacity at the left lung base. IMPRESSION: Mild hazy left lung base opacity, favor atelectasis, cannot exclude mild aspiration or developing pneumonia. Recommend follow-up PA and lateral post treatment chest radiographs in 4-6 weeks. Electronically Signed   By: Ilona Sorrel M.D.   On: 09/29/2015 18:39   Ir Nephrostomy Placement Left  Result Date: 09/30/2015 INDICATION:  Left hydronephrosis.  Fever. EXAM: IR NEPHROSTOMY PLACEMENT LEFT COMPARISON:  None. MEDICATIONS: None.  The patient was given Rocephin previously. ANESTHESIA/SEDATION: Fentanyl 50 mcg IV; Versed 1 mg IV Moderate Sedation Time:  30 The patient was continuously monitored during the procedure by the interventional radiology nurse under my  direct supervision. CONTRAST:  4mL ISOVUE-300 IOPAMIDOL (ISOVUE-300) INJECTION 61% - administered into the collecting system(s) FLUOROSCOPY TIME:  Fluoroscopy Time:  minutes 42 seconds (15 mGy). COMPLICATIONS: None immediate. PROCEDURE: Informed written consent was obtained from the patient after a thorough discussion of the procedural risks, benefits and alternatives. All questions were addressed. Maximal Sterile Barrier Technique was utilized including caps, mask, sterile gowns, sterile gloves, sterile drape, hand hygiene and skin antiseptic. A timeout was performed prior to the initiation of the procedure. The back was prepped and draped in a sterile fashion. 1% lidocaine was utilized for local anesthesia. Under sonographic guidance, a 21 gauge needle was inserted into a dilated posterior mid left renal calyx. Contrast was injected opacifying the collecting system. The needle was removed over a 018 wire which was up sized to a 3 J. A 10 French dilator followed by a 10 French nephrostomy were inserted. The catheter was looped and string fixed in the renal pelvis. Contrast was injected. Frank pus was aspirated. A sample was sent for culture. FINDINGS: Imaging confirms placement of a 10 French left nephrostomy. IMPRESSION: Successful left percutaneous nephrostomy catheter placement. Electronically Signed   By: [name] M.D.   On: 09/30/2015 09:11      Management plans discussed with the patient, family and they are in agreement.  CODE STATUS:     Code Status Orders        Start     Ordered   09/29/15 2214  Full code  Continuous     09/29/15 2213    Code Status  History    Date Active Date Inactive Code Status Order ID Comments User Context   09/25/2015  8:45 PM 09/28/2015  7:12 PM Full Code [id number]  Epifanio Lesches, MD ED      TOTAL TIME TAKING CARE OF THIS PATIENT: 40 minutes.    Henreitta Leber M.D on 10/01/2015 at 12:27 PM  Between 7am to 6pm - Pager - [phone]  After 6pm go to [email or web address] - password [email or web address]  Office  [phone]  CC: Primary care physician; Tracie Harrier, MD

## 2015-10-01 NOTE — Discharge Instructions (Signed)
Pyelonephritis, Adult °Pyelonephritis is a kidney infection. The kidneys are organs that help clean your blood by moving waste out of your blood and into your pee (urine). This infection can happen quickly, or it can last for a long time. In most cases, it clears up with treatment and does not cause other problems. °HOME CARE °Medicines °· Take over-the-counter and prescription medicines only as told by your doctor. °· Take your antibiotic medicine as told by your doctor. Do not stop taking the medicine even if you start to feel better. °General Instructions °· Drink enough fluid to keep your pee clear or pale yellow. °· Avoid caffeine, tea, and carbonated drinks. °· Pee (urinate) often. Avoid holding in pee for long periods of time. °· Pee before and after sex. °· After pooping (having a bowel movement), women should wipe from front to back. Use each tissue only once. °· Keep all follow-up visits as told by your doctor. This is important. °GET HELP IF: °· You do not feel better after 2 days. °· Your symptoms get worse. °· You have a fever. °GET HELP RIGHT AWAY IF: °· You cannot take your medicine or drink fluids as told. °· You have chills and shaking. °· You throw up (vomit). °· You have very bad pain in your side (flank) or back. °· You feel very weak or you pass out (faint). °  °This information is not intended to replace advice given to you by your health care provider. Make sure you discuss any questions you have with your health care provider. °  °Document Released: 03/14/2004 Document Revised: 10/26/2014 Document Reviewed: 05/30/2014 °Elsevier Interactive Patient Education ©2016 [employer] ° °

## 2015-10-01 NOTE — Discharge Summary (Signed)
Craig [name], is a 71 y.o. male  DOB 1945/04/15  MRN [id number].  Admission date:  09/25/2015  Admitting Physician  Craig Lesches, MD  Discharge Date:  8/10 /2017   Primary MD  Craig Harrier, MD  Recommendations for primary care physician for things to follow:  Follow-up with primary doctor in 1 week Follow-up with the Craig [name] in 1 week  For voiding trial.  Discharge home with Foley catheter  Admission Diagnosis  Pyelonephritis [N12]   Discharge Diagnosis  Pyelonephritis [N12]    Active Problems:   Acute pyelonephritis      Past Medical History:  Diagnosis Date  . BPH (benign prostatic hyperplasia)   . Hypercholesterolemia   . Hypertension     Past Surgical History:  Procedure Laterality Date  . IR GENERIC HISTORICAL  09/29/2015   IR NEPHROSTOMY PLACEMENT LEFT 09/29/2015 Craig Killings, MD [facility]  . REPLACEMENT TOTAL KNEE Right        History of present illness and  Hospital Course:     Kindly see H&P for history of present illness and admission details, please review complete Labs, Consult reports and Test reports for all details in brief  HPI  from the history and physical done on the day of admission History 55-year-old male patient with essential hypertension, hyperlipidemia, BPH admitted for left flank pain, fever, degrees urination for and found to have UTI, LEFT kidney swelling,   Hospital C ourse   #1 fever and left flank pain: Admitted to hospitalist service for UTI,.patient had fever and increased urination and flank pain at hom.  Patient  ua look ednormal limits on admission. the patient  Got Foley catheter, IV Levaquin.  Seen by urology.  Dr. Erlene [name] saw the patient.  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  Left flank  pain improved considerably.with   Antibiotics and Foley.  Inserted Foley on admission and patient failed voiding trials so discharged home with Foley catheter.  So given prescription for Levaquin 250 mg for 14 days.  Advised to follow-up with Dr. Erlene [name] in 1 week. And urine culture showed enterococci sensitive to Levaquin.  Discharge home with [name].  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  Patient fever subsided so did not require any intervention  By IR  to do Percutaneous nephrostomy,#2 BPH: Continue Flomax. Essential hypertension: Uncontrolled #4. hyperlipidemia: Continue statins. GERD continue PPIs.  Marland KitchenDischarge Condition;stable   Follow UP  Follow-up Information    Craig Espy, MD. Go on 11/06/2015.   Specialty:  Urology Why:  @8 :30am Contact information: [place] [address] [phone]             Discharge Instructions  and  Discharge Medications    Discharge Instructions    Insert,temp indwelling blad cath,simple    Complete by:  As directed       Medication List    TAKE these medications   cetirizine 10 MG tablet Commonly known as:  ZYRTEC Take 10 mg by mouth daily.   docusate sodium 100 MG capsule Commonly known as:  COLACE Take 100 mg by mouth 2 (two) times daily.   finasteride 5 MG tablet Commonly known as:  PROSCAR Take 5 mg by mouth daily.   fluticasone 50 MCG/ACT nasal spray Commonly known as:  FLONASE Place 2 sprays into both nostrils daily.   lisinopril 30 MG tablet Commonly known  as:  PRINIVIL,ZESTRIL Take 30 mg by mouth daily.   multivitamin tablet Take 1 tablet by mouth daily.   omeprazole 40 MG capsule Commonly known as:  PRILOSEC Take 40 mg by mouth 2 (two) times daily.   simvastatin 40 MG tablet Commonly known as:  ZOCOR Take 40 mg by mouth daily.   tamsulosin 0.4 MG Caps capsule Commonly known as:   FLOMAX Take 0.4 mg by mouth daily.   traMADol 50 MG tablet Commonly known as:  ULTRAM Take 50 mg by mouth 3 (three) times daily.   TYLENOL 8 HOUR ARTHRITIS PAIN 650 MG CR tablet Generic drug:  acetaminophen Take 650 mg by mouth every 8 (eight) hours as needed for pain.   vitamin E 400 UNIT capsule Take 400 Units by mouth daily.         Diet and Activity recommendation: See Discharge Instructions above   Consults obtained urology   Major procedures and Radiology Reports - PLEASE review detailed and final reports for all details, in brief -       Micro Results    Recent Results (from the past 240 hour(s))  Blood Culture (routine x 2)     Status: None   Collection Time: 09/25/15  9:57 AM  Result Value Ref Range Status   Specimen Description BLOOD LEFT AC IV  Final   Special Requests   Final    BOTTLES DRAWN AEROBIC AND ANAEROBIC ANA 10ML AER 12ML   Culture NO GROWTH 5 DAYS  Final   Report Status 09/30/2015 FINAL  Final  Urine culture     Status: Abnormal   Collection Time: 09/25/15 10:01 AM  Result Value Ref Range Status   Specimen Description URINE, RANDOM  Final   Special Requests NONE  Final   Culture >=100,000 COLONIES/mL ENTEROCOCCUS SPECIES (A)  Final   Report Status 09/27/2015 FINAL  Final   Organism ID, Bacteria ENTEROCOCCUS SPECIES (A)  Final      Susceptibility   Enterococcus species - MIC*    AMPICILLIN <=2 SENSITIVE Sensitive     LEVOFLOXACIN 0.5 SENSITIVE Sensitive     NITROFURANTOIN <=16 SENSITIVE Sensitive     VANCOMYCIN 1 SENSITIVE Sensitive     * >=100,000 COLONIES/mL ENTEROCOCCUS SPECIES  Culture, blood (Routine x 2)     Status: None (Preliminary result)   Collection Time: 09/29/15  5:50 PM  Result Value Ref Range Status   Specimen Description BLOOD RIGHT WRIST  Final   Special Requests   Final    BOTTLES DRAWN AEROBIC AND ANAEROBIC [place]   Culture NO GROWTH 2 DAYS  Final   Report Status PENDING  Incomplete  Urine culture      Status: None   Collection Time: 09/29/15  5:50 PM  Result Value Ref Range Status   Specimen Description URINE, RANDOM  Final   Special Requests NONE  Final   Culture NO GROWTH Performed at [facility]   Final   Report Status 09/30/2015 FINAL  Final  Culture, blood (Routine x 2)     Status: None (Preliminary result)   Collection Time: 09/29/15  5:51 PM  Result Value Ref Range Status   Specimen Description BLOOD LEFT HAND  Final   Special Requests BOTTLES DRAWN AEROBIC AND ANAEROBIC [place]  Final   Culture NO GROWTH 2 DAYS  Final   Report Status PENDING  Incomplete  Body fluid culture     Status: None (Preliminary result)   Collection Time: 09/29/15 10:25 PM  Result Value  Ref Range Status   Specimen Description FLUID LEFT NEPHROSTOMY  Final   Special Requests NONE  Final   Gram Stain   Final    ABUNDANT WBC PRESENT,BOTH PMN AND MONONUCLEAR NO ORGANISMS SEEN    Culture   Final    NO GROWTH 1 DAY Performed at [facility]    Report Status PENDING  Incomplete       Today   Subjective:   Craig [name] today has no headache,no chest abdominal pain,no new weakness tingling or numbness, feels much better wants to go home today.   Objective:   Blood pressure (!) 148/83, pulse 90, temperature 99.2 F (37.3 C), temperature source Oral, resp. rate 20, height 5\' 11"  (1.803 m), weight 104.6 kg (230 lb 8 oz), SpO2 99 %.  No intake or output data in the 24 hours ending 10/01/15 2309  Exam Awake Alert, Oriented x 3, No new F.N deficits, Normal affect [place].AT,PERRAL Supple Neck,No JVD, No cervical lymphadenopathy appriciated.  Symmetrical Chest wall movement, Good air movement bilaterally, CTAB RRR,No Gallops,Rubs or new Murmurs, No Parasternal Heave +ve B.Sounds, Abd Soft, Non tender, No organomegaly appriciated, No rebound -guarding or rigidity. No Cyanosis, Clubbing or edema, No new Rash or bruise  Data Review   CBC w Diff:  Lab Results  Component  Value Date   WBC 8.4 09/30/2015   HGB 11.2 (L) 09/30/2015   HGB 11.6 (L) 05/18/2014   HCT 31.9 (L) 09/30/2015   HCT 42.3 02/10/2012   PLT 166 09/30/2015   PLT 125 (L) 05/18/2014   LYMPHOPCT 11 09/29/2015   MONOPCT 18 09/29/2015   EOSPCT 1 09/29/2015   BASOPCT 0 09/29/2015    CMP:  Lab Results  Component Value Date   NA 138 09/30/2015   NA 135 05/18/2014   K 3.6 09/30/2015   K 4.0 05/18/2014   CL 105 09/30/2015   CL 101 05/18/2014   CO2 26 09/30/2015   CO2 28 05/18/2014   BUN 13 09/30/2015   BUN 14 05/18/2014   CREATININE 1.14 09/30/2015   CREATININE 0.96 05/18/2014   PROT 6.1 (L) 09/29/2015   PROT 6.8 02/10/2012   ALBUMIN 2.6 (L) 09/29/2015   ALBUMIN 3.7 02/10/2012   BILITOT 1.1 09/29/2015   BILITOT 0.6 02/10/2012   ALKPHOS 63 09/29/2015   ALKPHOS 78 02/10/2012   AST 28 09/29/2015   AST 19 02/10/2012   ALT 19 09/29/2015   ALT 32 02/10/2012  .   Total Time in preparing paper work, data evaluation and todays exam - 57 minutes  Craig [name] M.D on 09/28/2015 at 11:09 PM    Note: This dictation was prepared with Dragon dictation along with smaller phrase technology. Any transcriptional errors that result from this process are unintentional.

## 2015-10-01 NOTE — Progress Notes (Signed)
PHARMACIST - PHYSICIAN COMMUNICATION DR:   Verdell Carmine CONCERNING: Antibiotic IV to Oral Route Change Policy  RECOMMENDATION: This patient is receiving Levaquin by the intravenous route.  Based on criteria approved by the Pharmacy and Therapeutics Committee, the antibiotic(s) is/are being converted to the equivalent oral dose form(s).   DESCRIPTION: These criteria include:  Patient being treated for a respiratory tract infection, urinary tract infection, cellulitis or clostridium difficile associated diarrhea if on metronidazole  The patient is not neutropenic and does not exhibit a GI malabsorption state  The patient is eating (either orally or via tube) and/or has been taking other orally administered medications for a least 24 hours  The patient is improving clinically and has a Tmax < 100.5  If you have questions about this conversion, please contact the Pharmacy Department  []   [phone] )  Craig [name] [x]   [phone] )  [facility] []   [phone] )  [facility] []   [phone] )  [facility] []   [phone] )  [place], PharmD Clinical Pharmacist

## 2015-10-01 NOTE — Progress Notes (Signed)
10/01/2015 3:38 PM  BP (!) 156/95   Pulse 76   Temp 98.4 F (36.9 C) (Oral)   Resp 18   Ht 5\' 11"  (1.803 m)   Wt 108.9 kg (240 lb)   SpO2 97%   BMI 33.47 kg/m  Patient discharged per MD orders. Discharge instructions reviewed with patient and patient verbalized understanding. Wife instructed how to change dressing and empty nephrostomy tube. IV's removed per policy. Prescriptions discussed and given to patient. Discharged via wheelchair escorted by nursing staff.  Almedia Balls, RN

## 2015-10-03 LAB — BODY FLUID CULTURE: CULTURE: NO GROWTH

## 2015-10-04 LAB — CULTURE, BLOOD (ROUTINE X 2)
CULTURE: NO GROWTH
Culture: NO GROWTH
Culture: NO GROWTH

## 2015-10-06 ENCOUNTER — Encounter: Payer: Self-pay | Admitting: Urology

## 2015-10-06 ENCOUNTER — Ambulatory Visit (INDEPENDENT_AMBULATORY_CARE_PROVIDER_SITE_OTHER): Payer: Medicare Other | Admitting: Urology

## 2015-10-06 VITALS — BP 160/88 | HR 69 | Ht 71.0 in | Wt 221.0 lb

## 2015-10-06 DIAGNOSIS — Q620 Congenital hydronephrosis: Secondary | ICD-10-CM | POA: Diagnosis not present

## 2015-10-06 DIAGNOSIS — N261 Atrophy of kidney (terminal): Secondary | ICD-10-CM | POA: Diagnosis not present

## 2015-10-06 DIAGNOSIS — R3914 Feeling of incomplete bladder emptying: Secondary | ICD-10-CM | POA: Diagnosis not present

## 2015-10-06 DIAGNOSIS — N401 Enlarged prostate with lower urinary tract symptoms: Secondary | ICD-10-CM

## 2015-10-06 DIAGNOSIS — Q6211 Congenital occlusion of ureteropelvic junction: Secondary | ICD-10-CM

## 2015-10-06 DIAGNOSIS — R339 Retention of urine, unspecified: Secondary | ICD-10-CM | POA: Diagnosis not present

## 2015-10-06 LAB — BLADDER SCAN AMB NON-IMAGING: Scan Result: 187

## 2015-10-06 NOTE — Progress Notes (Signed)
10/06/2015 9:01 AM   Craig [name] 05-23-1945 [id number]  Referring provider: Tracie Harrier, MD [address] [facility] [place], [place] [address]  Chief Complaint  Patient presents with  . 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 During his first admission, he was treated conservatively with antibiotics in the form of Levaquin and improved, however, returned to the emergency room 5 days later with recurrent fevers and ultimately underwent left percutaneous nephrostomy tube placement. Per report, frank pus was drained from his kidney.   UCx from voided urine grew Enterococcus but UA negative.  Currently on Levaquin.    No further fevers.  Flank pain improved with nephrotomy tube.    He does have chronic left flank pain at baseline.  He has been told that he has a UPJ obstruction in the past but no intervention was ever recommended.  During his admissions, he also had difficulty voiding and failed a voiding trial. He is a personal history of BPH and is on finasteride and Flomax chronically. He was previously managed by Dr. Bernardo Heater  He continues to have severe urinary frequency q10-12 times night and day time frequency.  He also has urinary urgency but unable start his stream for at least 10-15 seconds at times.  No incontinence.   His stream is "OK".  He notes that he has been dribbling for years but the urgency/ frequency is new.  Since starting levaquin, he feeling that his is able to empty better but the frequency has not improved.    In terms of urological history, he does have history of urinary retention following the replacement surgery in 2016. He also has a previous history of elevated PSA and a status post negative biopsy many years ago by Dr. Yves Dill.  No recent PSA data although  per report was 1.7 in 2012 during an active UTI.  No previous abdominal surgery.    PMH: Past Medical History:  Diagnosis Date  . BPH (benign prostatic hyperplasia)   . Hypercholesterolemia   . Hypertension     Surgical History: Past Surgical History:  Procedure Laterality Date  . IR GENERIC HISTORICAL  09/29/2015   IR NEPHROSTOMY PLACEMENT LEFT 09/29/2015 [name], MD [facility]  . REPLACEMENT TOTAL KNEE Right     Home Medications:    Medication List       Accurate as of 10/06/15  9:01 AM. Always use your most recent med list.          cetirizine 10 MG tablet Commonly known as:  ZYRTEC Take 10 mg by mouth daily.   docusate sodium 100 MG capsule Commonly known as:  COLACE Take 100 mg by mouth 2 (two) times daily.   finasteride 5 MG tablet Commonly known as:  PROSCAR Take 5 mg by mouth daily.   fluticasone 50 MCG/ACT nasal spray Commonly known as:  FLONASE Place 2 sprays into both nostrils daily.   levofloxacin 250 MG tablet Commonly known as:  LEVAQUIN Take 1 tablet (250 mg total) by mouth daily.   lisinopril 30 MG tablet Commonly known as:  PRINIVIL,ZESTRIL Take 30 mg by mouth daily.   multivitamin tablet Take 1 tablet by mouth daily.   omeprazole 40 MG capsule Commonly known as:  PRILOSEC Take 40 mg by mouth 2 (two) times daily.   simvastatin 40 MG tablet  Commonly known as:  ZOCOR Take 40 mg by mouth daily.   tamsulosin 0.4 MG Caps capsule Commonly known as:  FLOMAX Take 0.4 mg by mouth daily.   traMADol 50 MG tablet Commonly known as:  ULTRAM Take 50 mg by mouth 3 (three) times daily.   TYLENOL 8 HOUR ARTHRITIS PAIN 650 MG CR tablet Generic drug:  acetaminophen Take 650 mg by mouth every 8 (eight) hours as needed for pain.   vitamin E 400 UNIT capsule Take 400 Units by mouth daily.       Allergies:  Allergies  Allergen Reactions  . Peanut Oil Other (See Comments)    Mouth ulcers Mouth ulcers    Family History: Family  History  Problem Relation Age of Onset  . Kidney cancer Mother   . Lung cancer Father   . Prostate cancer Neg Hx   . Bladder Cancer Neg Hx     Social History:  reports that he quit smoking about 32 years ago. He has a 30.00 pack-year smoking history. He has never used smokeless tobacco. He reports that he does not drink alcohol or use drugs.  ROS: UROLOGY Frequent Urination?: Yes Hard to postpone urination?: Yes Burning/pain with urination?: No Get up at night to urinate?: Yes Leakage of urine?: No Urine stream starts and stops?: Yes Trouble starting stream?: Yes Do you have to strain to urinate?: No Blood in urine?: No Urinary tract infection?: No Sexually transmitted disease?: No Injury to kidneys or bladder?: No Painful intercourse?: No Weak stream?: No Erection problems?: No Penile pain?: No  Gastrointestinal Nausea?: No Vomiting?: No Indigestion/heartburn?: Yes Diarrhea?: No Constipation?: No  Constitutional Fever: No Night sweats?: Yes Weight loss?: No Fatigue?: Yes  Skin Skin rash/lesions?: No Itching?: No  Eyes Blurred vision?: No Double vision?: No  Ears/Nose/Throat Sore throat?: No Sinus problems?: Yes  Hematologic/Lymphatic Swollen glands?: No Easy bruising?: No  Cardiovascular Leg swelling?: No Chest pain?: No  Respiratory Cough?: No Shortness of breath?: No  Endocrine Excessive thirst?: No  Musculoskeletal Back pain?: Yes Joint pain?: No  Neurological Headaches?: Yes Dizziness?: Yes  Psychologic Depression?: No Anxiety?: No  Physical Exam: BP (!) 160/88 (BP Location: Left Arm, Patient Position: Sitting, Cuff Size: Normal)   Pulse 69   Ht 5\' 11"  (1.803 m)   Wt 221 lb (100.2 kg)   BMI 30.82 kg/m   Constitutional:  Alert and oriented, No acute distress. HEENT: [place] AT, moist mucus membranes.  Trachea midline, no masses. Cardiovascular: No clubbing, cyanosis, or edema. RRR. Respiratory: Normal respiratory effort, no  increased work of breathing.  CTAB.  GI: Abdomen is soft, nontender, nondistended, no abdominal masses GU: No CVA tenderness. Left nephrostomy tube in place during clear yellow urine. Rectal exam: Normal sphincter tone, 50+ cc prostate, diffusely firm,  No nodules, nontender. Skin: No rashes, bruises or suspicious lesions. Lymph: No cervical or inguinal adenopathy. Neurologic: Grossly intact, no focal deficits, moving all 4 extremities. Psychiatric: Normal mood and affect.  Laboratory Data: Lab Results  Component Value Date   WBC 8.4 09/30/2015   HGB 11.2 (L) 09/30/2015   HCT 31.9 (L) 09/30/2015   MCV 91.2 09/30/2015   PLT 166 09/30/2015    Lab Results  Component Value Date   CREATININE 1.14 09/30/2015    Pertinent Imaging: Results for orders placed or performed in visit on 10/06/15  Bladder Scan (Post Void Residual) in office  Result Value Ref Range   Scan Result 187     CLINICAL DATA:  70 year old  male with urinary retention and chills since 1600 hours yesterday. Initial encounter.  EXAM: CT ABDOMEN AND PELVIS WITH CONTRAST  TECHNIQUE: Multidetector CT imaging of the abdomen and pelvis was performed using the standard protocol following bolus administration of intravenous contrast.  CONTRAST:  117mL ISOVUE-300 IOPAMIDOL (ISOVUE-300) INJECTION 61%  COMPARISON:  CT abdomen without and with contrast 08/16/2013. Report of nuclear medicine renal scan with Lasix 04/03/2001 (no images available).  FINDINGS: Mild linear atelectasis and respiratory motion artifact at the lung bases. No pericardial or pleural effusion.  No acute osseous abnormality identified.  There is a small volume of pelvic free fluid situated adjacent to distal small bowel loops. The urinary bladder is mildly to moderately distended, otherwise appears normal. See renal findings below.  Negative rectum. Redundant sigmoid colon with mild diverticulosis, but no active inflammation. The  left colon is negative aside from being displaced and compressed into the left lateral abdomen by the abnormal left kidney described below. Negative transverse colon. Oral contrast has reached the mid transverse colon. Redundant hepatic flexure. Negative right colon and appendix. Negative terminal ileum. No dilated small bowel. Small gastric hiatal has increased since 2015. Negative duodenum.  No abdominal free air or free fluid. Negative liver, gallbladder, pancreas. Portal venous system is patent. Aortoiliac calcified atherosclerosis noted. Major arterial structures are patent.  There is mild mass effect on the spleen and regional soft tissue stranding which emanates from the left kidney. Mild mass effect and stranding at the left adrenal gland as well. There is chronic severe left hydronephrosis and thinning of the left renal parenchyma. In 2015 the markedly enlarged left renal collecting system measured up to about 16.5 cm.  Today the abnormal left kidney has further enlarged, with a massively dilated collecting system up to 19.3 cm . Also, the left kidney appears acutely inflamed with new inflammatory stranding throughout the left para renal space. The left kidney is also hypoenhancing relative to that on the right, but this is chronic to an extent. The left ureter appears to be decompressed, and is difficult to identify at its junction with the massively dilated left renal pelvis. No urologic calculus or nephrocalcinosis. Mild, reactive appearing retroperitoneal lymph nodes on the left. Secondary inflammation of the ventral surface of the left psoas muscle. There is also mild secondary stranding in the mesentery of the left splenic flexure. As expected on delayed renal images there is contrast excretion from the right kidney, but non on the left.  IMPRESSION: 1. Chronic markedly dilated left kidney has further enlarged since 2015 (massively dilated) and is newly inflamed  with confluent left pararenal stranding. Reactive left retroperitoneal lymphadenopathy and secondary inflammation of the adjacent left colon, spleen and psoas muscle. Suspect Acute Superinfection of the chronically abnormal kidney. Recommend Urology consultation. 2. Small volume of pelvic free fluid also felt secondary to #1. 3.  Calcified aortic atherosclerosis.   Electronically Signed   By: Genevie Ann M.D.   On: 09/25/2015 15:56  CT scan reviewed again today personally and with the patient.  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 In addition, he has fairly significant lower urinary tract symptoms related to BPH with incomplete bladder emptying and a history of retention on maximal medical therapy.  1. Hydronephrosis with ureteropelvic junction (UPJ) obstruction Options moving forward were discussed today in detail. He does have chronic discomfort from his UPJ obstruction  and there severe left renal atrophy with massive hydronephrosis and now complicated by infection.  As such, I would recommend left nephrectomy both for the purpose symptomatic relief as well as removing potential future infectious nidus. Alternative options including chronic indwelling ureteral stent versus UPJ repair were also discussed. Given the degree of atrophy, I do not feel that there is much benefit to reconstruction. I have offered him a Lasix renogram to confirm overall renal function which he declined.  We discussed simple left hand-assisted laparoscopic nephrectomy today in detail. Preoperative, intraoperative, and postoperative courses were discussed. Risks including bleeding, infection, damage to surrounding structures, ileus, bowel obstruction, pain, hernia formation, amongst others were discussed in detail. All of his questions were  answered. I recommended leaving his nephrostomy tube in place until the time of surgery. In addition, I would like him to continue antibiotics for an additional week given the severity of his infection.  Schedule left hand-assisted laparoscopic nephrectomy.  2. Left renal atrophy As above  3. Urinary retention History of urinary retention 2 on maximal medical therapy. He passed a voiding trial during his second admission prior to being seen today in clinic. - Bladder Scan (Post Void Residual) in office  4. Benign prostatic hypertrophy (BPH) with incomplete bladder emptying Elevated PVR today noted today along with severe urinary symptoms.  Continue Flomax/finasteride.  Rectal exam today shows enlarged gland, PSA deferred given recent infection and catheter. He will need a follow-up PSA in the near future.  I have recommended we address his renal issue prior to considering any possible outlet procedure.  We will discuss this further at a later date. All his questions were answered.   Hollice Espy, MD  [facility] [address] [place], [place] [address] [phone]  I spent 40 min with this patient of which greater than 50% was spent in counseling and coordination of care with the patient.

## 2015-10-09 ENCOUNTER — Other Ambulatory Visit: Payer: Self-pay | Admitting: Radiology

## 2015-10-09 DIAGNOSIS — Q6211 Congenital occlusion of ureteropelvic junction: Secondary | ICD-10-CM

## 2015-10-09 DIAGNOSIS — N261 Atrophy of kidney (terminal): Secondary | ICD-10-CM

## 2015-10-10 ENCOUNTER — Telehealth: Payer: Self-pay | Admitting: Radiology

## 2015-10-10 NOTE — Telephone Encounter (Signed)
Notified pt & wife, Inez Catalina, of surgery scheduled with Dr Erlene Quan on 11/07/15, pre-admit testing appt on 10/24/15 @9 :45 & to call day prior to surgery for arrival time to SDS. Pt voices understanding.

## 2015-10-16 ENCOUNTER — Telehealth: Payer: Self-pay | Admitting: Radiology

## 2015-10-16 NOTE — Telephone Encounter (Signed)
Patient states he will be out of Levaquin as of 10/31/2015. Surgery is scheduled 11/07/2015. Does he need a refill? Pt also states the nephrostomy tube has almost completely stopped draining, with only about 2 Tbsp of urine draining per day. Is this normal? Pt states he is urinating normally & feels he is emptying his bladder adequately.

## 2015-10-17 NOTE — Telephone Encounter (Signed)
Patient's wife called stating in addition to patient having low urine volume from his nephrostomy tube he has also been very confused, irritated, and forgetful. She states he is having night sweats so bad he is soaking through shirts, no fever, nausea or vomiting.   Per Dr. Erlene Quan patient and wife should come in for a nurse visit for nephrostomy tube flushing teaching and a culture from the tube to rule out infection.  Patient's wife was notified and apt was made for tomorrow at 11am on the nurse schedule.

## 2015-10-18 ENCOUNTER — Telehealth: Payer: Self-pay | Admitting: Radiology

## 2015-10-18 ENCOUNTER — Ambulatory Visit (INDEPENDENT_AMBULATORY_CARE_PROVIDER_SITE_OTHER): Payer: Medicare Other

## 2015-10-18 VITALS — BP 162/108 | HR 70 | Temp 98.1°F | Ht 71.0 in | Wt 220.3 lb

## 2015-10-18 DIAGNOSIS — N261 Atrophy of kidney (terminal): Secondary | ICD-10-CM | POA: Diagnosis not present

## 2015-10-18 NOTE — Addendum Note (Signed)
Addended by: Hollice Espy on: 10/18/2015 04:30 PM   Modules accepted: Orders

## 2015-10-18 NOTE — Progress Notes (Signed)
Given his nonspecific symptoms and decreased output, will arrange for antegrade nephrogram this week as possible with repositioning as needed.  Hollice Espy, MD

## 2015-10-18 NOTE — Progress Notes (Signed)
  Patient having low urine volume from his nephrostomy tube he has also been very confused, irritated, and forgetful. Patient's wife states he is having night sweats so bad he is soaking through shirts, no fever, nausea or vomiting.   Per Dr. Erlene Quan patient and wife should come in for a nurse visit for nephrostomy tube flushing teaching and a culture from the tube to rule out infection. Patient present today for culture and irrigation teaching.  Culture was drawn from nephrostomy tube urine was cloudy and red in color. Patient and wife were given irrigation instructions as follows: Remove bag from tube, then take 10cc of saline in a 10cc syringe and gently push into tube then pull back on tube for irrigation. This only needs to be done if tube appears to not be draining. Tube is draining fine today. Patient was sent home with a bag of supplies for irrigation, they are to call with any questions or concerns.   -Patient experienced hot flash along with nausea at the office and had to lay down while collecting culture. Patient did not have a fever it was 98.1

## 2015-10-18 NOTE — Telephone Encounter (Signed)
Notified pt of antegrade nephrostogram scheduled on 10/20/15. Pt is to arrive at [facility] registration desk at 1:30 & be npo after 7:30 am that day. Advised pt to take medications as usual that morning before 7:30. Pt voices understanding.

## 2015-10-20 ENCOUNTER — Ambulatory Visit
Admission: RE | Admit: 2015-10-20 | Discharge: 2015-10-20 | Disposition: A | Discharge status: Home / Self Care | Payer: Medicare Other | Source: Ambulatory Visit | Attending: Urology | Admitting: Urology

## 2015-10-20 DIAGNOSIS — N261 Atrophy of kidney (terminal): Secondary | ICD-10-CM | POA: Insufficient documentation

## 2015-10-20 DIAGNOSIS — Z791 Long term (current) use of non-steroidal anti-inflammatories (NSAID): Secondary | ICD-10-CM | POA: Insufficient documentation

## 2015-10-20 DIAGNOSIS — Z7982 Long term (current) use of aspirin: Secondary | ICD-10-CM | POA: Insufficient documentation

## 2015-10-20 DIAGNOSIS — Z79899 Other long term (current) drug therapy: Secondary | ICD-10-CM | POA: Insufficient documentation

## 2015-10-20 DIAGNOSIS — Y738 Miscellaneous gastroenterology and urology devices associated with adverse incidents, not elsewhere classified: Secondary | ICD-10-CM

## 2015-10-20 DIAGNOSIS — T8389XA Other specified complication of genitourinary prosthetic devices, implants and grafts, initial encounter: Secondary | ICD-10-CM | POA: Insufficient documentation

## 2015-10-20 DIAGNOSIS — I10 Essential (primary) hypertension: Secondary | ICD-10-CM | POA: Insufficient documentation

## 2015-10-20 DIAGNOSIS — Z87891 Personal history of nicotine dependence: Secondary | ICD-10-CM | POA: Diagnosis not present

## 2015-10-20 DIAGNOSIS — Z4801 Encounter for change or removal of surgical wound dressing: Secondary | ICD-10-CM | POA: Insufficient documentation

## 2015-10-20 HISTORY — DX: Gastro-esophageal reflux disease without esophagitis: K21.9

## 2015-10-20 HISTORY — PX: IR GENERIC HISTORICAL: IMG1180011

## 2015-10-20 HISTORY — DX: Personal history of other diseases of the digestive system: Z87.19

## 2015-10-20 MED ORDER — SODIUM CHLORIDE 0.9 % IJ SOLN
INTRAMUSCULAR | Status: AC
  Filled 2015-10-20: qty 50

## 2015-10-20 MED ORDER — IOPAMIDOL (ISOVUE-300) INJECTION 61%
30.0000 mL | Freq: Once | INTRAVENOUS | Status: AC | PRN
  Administered 2015-10-20: 20 mL

## 2015-10-20 NOTE — Discharge Instructions (Signed)
Percutaneous Nephrostomy, Care After °Refer to this sheet in the next few weeks. These instructions provide you with information on caring for yourself after your procedure. Your health care provider may also give you more specific instructions. Your treatment has been planned according to current medical practices, but problems sometimes occur. Call your health care provider if you have any problems or questions after your procedure. °WHAT TO EXPECT AFTER THE PROCEDURE °You will need to remain lying down for several hours. °HOME CARE INSTRUCTIONS °· Your nephrostomy tube is connected to a leg bag or bedside drainage bag. Always keep the tubing, the leg bag, or the bedside drainage bags below the level of the kidney so that the urine drains freely. °· During the day, if you are connecting the nephrostomy tube to a leg bag, be sure there are no kinks in the tubing and that the urine is draining freely. °· At night, you may want to connect the nephrostomy tube or the leg bag to a larger bedside drainage bag. °· Change the dressing as often as directed by your health care provider, or if it becomes wet. °¨ Gently remove the tapes and dressing from around the nephrostomy tube. Be careful not to pull on the tube while removing the dressing. °¨ Wash the skin around the tube, rinse well, and dry. °¨ Place two split drain sponges in and around the tube exit site. °¨ Place tape around edge of the dressing. °¨ Secure the nephrostomy tubing. Remember to make certain that the nephrostomy tube does not kink or become pinched closed. It can be useful to wrap any exposed tubing going from the nephrostomy tube to any of the connecting tubes to either the leg bag or drainage bag with an elastic bandage. °· Every three weeks, replace the leg bag, drainage bag, and any extension tubing connected to your nephrostomy tube. Your health care provider will explain how to change the drainage bag and extension tubing. °SEEK MEDICAL CARE  IF: °· You experience any problems with any of the valves or tubing. °· You have persistent pain or soreness in your back. °· You have a fever or chills. °SEEK IMMEDIATE MEDICAL CARE IF: °· You have abdominal pain during the first week. °· You have a new appearance of blood in your urine. °· You have back pain that is not relieved by your medicine. °· You have drainage, redness, swelling, or pain at the tube insertion site. °· You have decreased urine output. °· Your nephrostomy tube comes out. °  °This information is not intended to replace advice given to you by your health care provider. Make sure you discuss any questions you have with your health care provider. °  °Document Released: 09/28/2003 Document Revised: 02/25/2014 Document Reviewed: 10/01/2012 °Elsevier Interactive Patient Education ©2016 [employer] ° °

## 2015-10-20 NOTE — ED Triage Notes (Signed)
Pt with left sided nephrostomy tube, pt states had dressing placed today with statlock in place. Pt states "i'm here because it's pulled to tight and I want the dressing changed back to like it was."

## 2015-10-21 ENCOUNTER — Emergency Department
Admission: EM | Admit: 2015-10-21 | Discharge: 2015-10-21 | Disposition: A | Discharge status: Home / Self Care | Payer: Medicare Other | Attending: Emergency Medicine | Admitting: Emergency Medicine

## 2015-10-21 DIAGNOSIS — Z5189 Encounter for other specified aftercare: Secondary | ICD-10-CM

## 2015-10-21 DIAGNOSIS — Z4801 Encounter for change or removal of surgical wound dressing: Secondary | ICD-10-CM

## 2015-10-21 LAB — CULTURE, URINE COMPREHENSIVE

## 2015-10-21 NOTE — ED Notes (Signed)
Pt had has a nephrostomy tube x3 weeks, today he was seen to make sure tube is still in correct place. New adhesive clamp placed on skin to keep tube from moving. Pt states adhesive is too tight and it is painful for him. Denies skin rash to site.

## 2015-10-21 NOTE — ED Provider Notes (Signed)
[facility] Emergency Department Provider Note   ____________________________________________   First MD Initiated Contact with Patient 10/21/15 0142     (approximate)  I have reviewed the triage vital signs and the nursing notes.   HISTORY  Chief Complaint Wound Check    HPI Craig [name] is a 70 y.o. male who comes into the hospital today for dressing change. The patient had a procedures the day where his nephrostomy tube was readjusted. He reports that he was concerned it may pull out so they put some stitches around the tube as well as a butterfly clip on the tube. He reports that it was initially okay after the procedure was done but then it started to feel very tight as he was moving around. He reports that he tried to sleep but was only able to do so for 45 minutes. He reports that he pulled off the dressing and was able to pull up on some of the tape of the butterfly dressing. He reports that it feels more comfortable now. He does need the area redressed at this time though. He reports that it feels okay and there is a plan in place for him to have a left nephrectomy. The patient is here for evaluation.   Past Medical History:  Diagnosis Date  . BPH (benign prostatic hyperplasia)   . GERD (gastroesophageal reflux disease)   . History of hiatal hernia   . Hypercholesterolemia   . Hypertension     Patient Active Problem List   Diagnosis Date Noted  . Sepsis ([place]) 09/29/2015  . Acute pyelonephritis 09/25/2015  . Frequency of urination 05/29/2015  . BPH (benign prostatic hyperplasia) 09/05/2014  . Hyperlipidemia 09/05/2014  . Enlarged prostate with urinary obstruction 07/03/2014  . History of total knee replacement 06/02/2014  . Status post total right knee replacement 06/02/2014  . Osteoarthritis of knee 05/12/2014  . HTN (hypertension) 06/28/2013  . Hypertension 06/28/2013    Past Surgical History:  Procedure Laterality Date  . IR  GENERIC HISTORICAL  09/29/2015   IR NEPHROSTOMY PLACEMENT LEFT 09/29/2015 [name], MD [facility]INTER[facility] RAD  . IR GENERIC HISTORICAL  10/20/2015   IR NEPHROSTOGRAM LEFT THRU EXISTING ACCESS 10/20/2015 [facility]INTER[facility] RAD  . REPLACEMENT TOTAL KNEE Right   . TONSILLECTOMY      Prior to Admission medications   Medication Sig Start Date End Date Taking? Authorizing Provider  acetaminophen (TYLENOL 8 HOUR ARTHRITIS PAIN) 650 MG CR tablet Take 650 mg by mouth every 8 (eight) hours as needed for pain.    Historical Provider, MD  acetaminophen (TYLENOL) 500 MG tablet Take 500 mg by mouth every 6 (six) hours as needed.    Historical Provider, MD  aspirin EC 81 MG tablet Take 81 mg by mouth daily.    Historical Provider, MD  cetirizine (ZYRTEC) 10 MG tablet Take 10 mg by mouth daily.    Historical Provider, MD  docusate sodium (COLACE) 100 MG capsule Take 100 mg by mouth 2 (two) times daily.    Historical Provider, MD  finasteride (PROSCAR) 5 MG tablet Take 5 mg by mouth daily.    Historical Provider, MD  fluticasone (FLONASE) 50 MCG/ACT nasal spray Place 2 sprays into both nostrils daily.    Historical Provider, MD  levofloxacin (LEVAQUIN) 250 MG tablet Take 1 tablet (250 mg total) by mouth daily. 10/01/15   Henreitta Leber, MD  lisinopril (PRINIVIL,ZESTRIL) 30 MG tablet Take 30 mg by mouth daily.    Historical Provider, MD  Multiple Vitamin (MULTIVITAMIN) tablet Take 1 tablet by mouth daily.    Historical Provider, MD  omeprazole (PRILOSEC) 40 MG capsule Take 40 mg by mouth 2 (two) times daily.    Historical Provider, MD  simvastatin (ZOCOR) 40 MG tablet Take 40 mg by mouth daily.    Historical Provider, MD  tamsulosin (FLOMAX) 0.4 MG CAPS capsule Take 0.4 mg by mouth daily.    Historical Provider, MD  traMADol (ULTRAM) 50 MG tablet Take 50 mg by mouth 3 (three) times daily.    Historical Provider, MD  vitamin E 400 UNIT capsule Take 400 Units by mouth daily.    Historical Provider, MD    Allergies Peanut  oil  Family History  Problem Relation Age of Onset  . Kidney cancer Mother   . Lung cancer Father   . Prostate cancer Neg Hx   . Bladder Cancer Neg Hx     Social History Social History  Substance Use Topics  . Smoking status: Former Smoker    Packs/day: 1.50    Years: 20.00    Quit date: 09/25/1983  . Smokeless tobacco: Never Used  . Alcohol use No    Review of Systems Constitutional: No fever/chills Eyes: No visual changes. ENT: No sore throat. Cardiovascular: Denies chest pain. Respiratory: Denies shortness of breath. Gastrointestinal: No abdominal pain.  No nausea, no vomiting.  No diarrhea.  No constipation. Genitourinary: Negative for dysuria. Musculoskeletal: Negative for back pain. Skin: Negative for rash. Neurological: Negative for headaches, focal weakness or numbness.  10-point ROS otherwise negative.  ____________________________________________   PHYSICAL EXAM:  VITAL SIGNS: ED Triage Vitals  Enc Vitals Group     BP 10/20/15 2355 (!) 157/96     Pulse Rate 10/20/15 2355 84     Resp 10/20/15 2355 18     Temp 10/20/15 2355 98 F (36.7 C)     Temp Source 10/20/15 2355 Oral     SpO2 10/20/15 2355 99 %     Weight 10/20/15 2357 220 lb (99.8 kg)     Height 10/20/15 2357 5\' 11"  (1.803 m)     Head Circumference --      Peak Flow --      Pain Score 10/20/15 2357 5     Pain Loc --      Pain Edu? --      Excl. in [place]? --     Constitutional: Alert and oriented. Well appearing and in no acute distress. Eyes: Conjunctivae are normal. PERRL. EOMI. Head: Atraumatic. Nose: No congestion/rhinnorhea. Mouth/Throat: Mucous membranes are moist.  Oropharynx non-erythematous. Cardiovascular: Normal rate, regular rhythm. Grossly normal heart sounds.  Good peripheral circulation. Respiratory: Normal respiratory effort.  No retractions. Lungs CTAB. Gastrointestinal: Soft and nontender. No distention. Positive bowel sounds Musculoskeletal: No lower extremity tenderness  nor edema.   Neurologic:  Normal speech and language.  Skin: Nephrostomy tube in place with no significant drainage. Dressing is slightly pulled up and gauze and Tegaderm was pulled off. Psychiatric: Mood and affect are normal.  ____________________________________________   LABS (all labs ordered are listed, but only abnormal results are displayed)  Labs Reviewed - No data to display ____________________________________________  EKG  none ____________________________________________  RADIOLOGY  none ____________________________________________   PROCEDURES  Procedure(s) performed: None  Procedures  Critical Care performed: No  ____________________________________________   INITIAL IMPRESSION / ASSESSMENT AND PLAN / ED COURSE  Pertinent labs & imaging results that were available during my care of the patient were reviewed by me and considered  in my medical decision making (see chart for details).  This is a 70 year old male who comes in today for dressing change on his nephrostomy tube. He reports that the dressing was too tight and he was able to pull some of the dressing off so it did not feel as uncomfortable. We did remove the clip from around his nephrostomy tube and just placed a Tegaderm and some gauze over his surgical site. The patient will be discharged to follow-up with his surgeon.  Clinical Course     ____________________________________________   FINAL CLINICAL IMPRESSION(S) / ED DIAGNOSES  Final diagnoses:  Visit for wound check  Dressing change or removal, surgical wound      NEW MEDICATIONS STARTED DURING THIS VISIT:  Discharge Medication List as of 10/21/2015  2:26 AM       Note:  This document was prepared using Dragon voice recognition software and may include unintentional dictation errors.    Loney Hering, MD 10/21/15 [phone]

## 2015-10-24 ENCOUNTER — Other Ambulatory Visit: Payer: Medicare Other

## 2015-10-24 ENCOUNTER — Telehealth: Payer: Self-pay

## 2015-10-24 NOTE — Telephone Encounter (Signed)
At this point, is unclear why he is having such severe pain. The nephrostomy tube was in adequate position there is no evidence of infection.  I recommend taking pain medication as needed. Hopefully his pain will resolve with nephrectomy. Please let me know if he develops any lesions in the area consistent with shingles.  Hollice Espy, MD'

## 2015-10-24 NOTE — Telephone Encounter (Signed)
Line busy

## 2015-10-24 NOTE — Telephone Encounter (Signed)
Spoke with pt wife in reference to neph tube placement. Wife voiced understanding stating radiologist made them aware.  Wife stated that pt is c/o 10/10 pain on right side kidney/flank area. Wife stated that pt has previously had shingles in that area but is not sure if that is coming back. Please advise.

## 2015-10-24 NOTE — Telephone Encounter (Signed)
-----   Message from Hollice Espy, MD sent at 10/23/2015  7:52 PM EDT ----- Please let Mr. Skrine know that looked at study, nephr stomy tube in good position.  Kidney is just poorly functioning and not making much urine.    Hollice Espy, MD

## 2015-10-25 NOTE — Telephone Encounter (Signed)
Spoke with pt wife in reference to pain. Reinforced with wife for pt to take pain medication as needed. Wife voiced understanding.

## 2015-10-26 ENCOUNTER — Telehealth: Payer: Self-pay | Admitting: Urology

## 2015-10-26 NOTE — Telephone Encounter (Signed)
Pt states he only has 2 days left of Levofloxacin - at last appt states he mentioned that he was low and was told the Rx would be "called" in.  Pt states his pharmacy hasn't received anything from [place]. He is scheduled for surgery next week and is concerned about running out. Please advise.

## 2015-10-27 NOTE — Telephone Encounter (Signed)
Per Dr. Erlene Quan pt has had negative cultures recently therefore no more abx are needed. Made pt aware. Pt voiced understanding.

## 2015-10-31 ENCOUNTER — Encounter
Admission: RE | Admit: 2015-10-31 | Discharge: 2015-10-31 | Disposition: A | Discharge status: Home / Self Care | Payer: Medicare Other | Source: Ambulatory Visit | Attending: Urology | Admitting: Urology

## 2015-10-31 DIAGNOSIS — Z96651 Presence of right artificial knee joint: Secondary | ICD-10-CM | POA: Insufficient documentation

## 2015-10-31 DIAGNOSIS — K219 Gastro-esophageal reflux disease without esophagitis: Secondary | ICD-10-CM | POA: Diagnosis not present

## 2015-10-31 DIAGNOSIS — Z01812 Encounter for preprocedural laboratory examination: Secondary | ICD-10-CM | POA: Diagnosis present

## 2015-10-31 DIAGNOSIS — N400 Enlarged prostate without lower urinary tract symptoms: Secondary | ICD-10-CM | POA: Diagnosis not present

## 2015-10-31 DIAGNOSIS — I10 Essential (primary) hypertension: Secondary | ICD-10-CM | POA: Diagnosis not present

## 2015-10-31 DIAGNOSIS — E7800 Pure hypercholesterolemia, unspecified: Secondary | ICD-10-CM | POA: Insufficient documentation

## 2015-10-31 HISTORY — DX: Malignant (primary) neoplasm, unspecified: C80.1

## 2015-10-31 LAB — TYPE AND SCREEN
ABO/RH(D): A POS
ANTIBODY SCREEN: NEGATIVE

## 2015-10-31 LAB — CBC
HCT: 40.2 % (ref 40.0–52.0)
Hemoglobin: 13.9 g/dL (ref 13.0–18.0)
MCH: 31.7 pg (ref 26.0–34.0)
MCHC: 34.5 g/dL (ref 32.0–36.0)
MCV: 91.9 fL (ref 80.0–100.0)
PLATELETS: 129 10*3/uL — AB (ref 150–440)
RBC: 4.38 MIL/uL — AB (ref 4.40–5.90)
RDW: 14.7 % — ABNORMAL HIGH (ref 11.5–14.5)
WBC: 5 10*3/uL (ref 3.8–10.6)

## 2015-10-31 LAB — BASIC METABOLIC PANEL
Anion gap: 7 (ref 5–15)
BUN: 17 mg/dL (ref 6–20)
CALCIUM: 9.1 mg/dL (ref 8.9–10.3)
CHLORIDE: 103 mmol/L (ref 101–111)
CO2: 28 mmol/L (ref 22–32)
CREATININE: 1.06 mg/dL (ref 0.61–1.24)
GFR calc non Af Amer: >60 mL/min (ref >60)
Glucose, Bld: 100 mg/dL — ABNORMAL HIGH (ref 65–99)
Potassium: 4.1 mmol/L (ref 3.5–5.1)
SODIUM: 138 mmol/L (ref 135–145)

## 2015-10-31 LAB — URINALYSIS COMPLETE WITH MICROSCOPIC (ARMC ONLY)
BILIRUBIN URINE: NEGATIVE
Bacteria, UA: NONE SEEN
Glucose, UA: NEGATIVE mg/dL
Hgb urine dipstick: NEGATIVE
KETONES UR: NEGATIVE mg/dL
Leukocytes, UA: NEGATIVE
Nitrite: NEGATIVE
Protein, ur: NEGATIVE mg/dL
Specific Gravity, Urine: 1.01 (ref 1.005–1.030)
WBC, UA: NONE SEEN WBC/hpf (ref 0–5)
pH: 7 (ref 5.0–8.0)

## 2015-10-31 NOTE — Patient Instructions (Signed)
Your procedure is scheduled on: November 07, 2015 Su procedimiento est programado para: Report to:  [facility] TO SURGERY INFORMATION DESK - COME IN REVOLVING DOOR Presntese a: To find out your arrival time please call [phone] between 1PM - 3PM on Monday November 06, 2015 Para saber su hora de llegada por favor llame al ([place]:  Remember: Instructions that are not followed completely may result in serious medical risk, up to and including death, or upon the discretion of your surgeon and anesthesiologist your surgery may need to be rescheduled.  Recuerde: Las instrucciones que no se siguen completamente Heritage manager en un riesgo de salud grave, incluyendo hasta la [place] o a discrecin de su cirujano y Environmental health practitioner, su ciruga se puede posponer.   __X__ 1. Do not eat food or drink liquids after midnight. No gum chewing or hard candies.  No coma alimentos ni tome lquidos despus de la medianoche.  No mastique chicle ni caramelos  duros.     __X__ 2. No alcohol for 24 hours before or after surgery.    No tome alcohol durante las 24 horas antes ni despus de la [place].   ____ 3. Bring all medications with you on the day of surgery if instructed.    Lleve todos los medicamentos con usted el da de su ciruga si se le ha indicado as.   __X__ 4. Notify your doctor if there is any change in your medical condition (cold, fever,                             infections).    Informe a su mdico si hay algn cambio en su condicin mdica (resfriado, fiebre, infecciones).   Do not wear jewelry, make-up, hairpins, clips or nail polish.  No use joyas, maquillajes, pinzas/ganchos para el cabello ni esmalte de uas.  Do not wear lotions, powders, or perfumes. You may wear deodorant.  No use lociones, polvos o perfumes.  Puede usar desodorante.    Do not shave 48 hours prior to surgery. Men may shave face and neck.  No se afeite 48  horas antes de la [place].  Los hombres pueden [employer] cara y el cuello.   Do not bring valuables to the hospital.   No lleve objetos [facility] is not responsible for any belongings or valuables.  [facility] no se hace responsable de ningn tipo de pertenencias u objetos de Geographical information systems officer.               Contacts, dentures or bridgework may not be worn into surgery.  Los lentes de [place], las dentaduras postizas o puentes no se pueden usar en la [place].  Leave your suitcase in the car. After surgery it may be brought to your room.  Deje su maleta en el auto.  Despus de la ciruga podr traerla a su habitacin.  For patients admitted to the hospital, discharge time is determined by your treatment team.  Para los pacientes que sean ingresados al hospital, el tiempo en el cual se le dar de alta es determinado por su                equipo de [place].   Patients discharged the day of surgery will not be allowed to drive home. A los pacientes que se les da de alta el mismo da de la Leisure centre manager  no se les permitir conducir a casa.   Please read over the following fact sheets that you were given: Por favor [place] informacin que le dieron:   CHG INSTRUCTION SHEET   __X__ Take these medicines the morning of surgery with A SIP OF WATER:          [employer] estas medicinas la maana de la ciruga con UN SORBO DE AGUA:  1. ZYRTEC  2. LISINOPRIL  3. FLOMAX  4. FINASTERIDE      5. OMEPRAZOLE  6. TRAMADOL  ____ Fleet Enema (as directed)          Enema de Fleet (segn lo indicado)    __X__ Use CHG Soap as directed          Utilice el jabn de CHG segn lo indicado  _X__ Use inhalers on the day of surgery USE FLONASE          Use los inhaladores el da de la ciruga  ____ Stop metformin 2 days prior to surgery          Deje de tomar el metformin 2 das antes de la ciruga    ____ Take 1/2 of usual insulin dose the night before surgery and none on the  morning of surgery           Tome la mitad de la dosis habitual de insulina la noche antes de la [place] y no tome nada en la maana de la             ciruga  _X___ Stop Coumadin/Plavix/aspirin - ASPIRIN ALREADY STOPPED           Deje de tomar el Coumadin/Plavix/aspirina el da:  __X_ Stop Anti-inflammatories on ADVIL, IBUPROFEN , ALEVE, MOTRIN - USE ONLY TYLENOL          Deje de tomar antiinflamatorios el da:   __X_ Stop supplements until after surgery            Deje de tomar suplementos hasta despus de la ciruga  ____ Bring C-Pap to the hospital          Poneto al hospital

## 2015-10-31 NOTE — Pre-Procedure Instructions (Signed)
  Anesthesia- ED note for EKG  ED ECG REPORT I, Daymon Larsen, the attending physician, personally viewed and interpreted this ECG.  Date: 09/29/2015 EKG Time: *1825 Rate: 78 Rhythm: normal sinus rhythm QRS Axis: normal Intervals: normal ST/T Wave abnormalities: normal Conduction Disturbances: none Narrative Interpretation: unremarkable No acute ischemic changes

## 2015-11-01 LAB — URINE CULTURE: CULTURE: NO GROWTH

## 2015-11-07 ENCOUNTER — Inpatient Hospital Stay
Admission: RE | Admit: 2015-11-07 | Discharge: 2015-11-08 | DRG: 661 | Disposition: A | Discharge status: Home / Self Care | Payer: Medicare Other | Source: Ambulatory Visit | Attending: Urology | Admitting: Urology

## 2015-11-07 ENCOUNTER — Inpatient Hospital Stay: Payer: Medicare Other | Admitting: Anesthesiology

## 2015-11-07 ENCOUNTER — Encounter
Admission: RE | Disposition: A | Discharge status: Home / Self Care | Payer: Self-pay | Source: Ambulatory Visit | Attending: Urology

## 2015-11-07 ENCOUNTER — Encounter: Payer: Self-pay | Admitting: *Deleted

## 2015-11-07 DIAGNOSIS — Z801 Family history of malignant neoplasm of trachea, bronchus and lung: Secondary | ICD-10-CM

## 2015-11-07 DIAGNOSIS — Z79899 Other long term (current) drug therapy: Secondary | ICD-10-CM

## 2015-11-07 DIAGNOSIS — N136 Pyonephrosis: Secondary | ICD-10-CM | POA: Diagnosis present

## 2015-11-07 DIAGNOSIS — I10 Essential (primary) hypertension: Secondary | ICD-10-CM | POA: Diagnosis present

## 2015-11-07 DIAGNOSIS — Z8051 Family history of malignant neoplasm of kidney: Secondary | ICD-10-CM

## 2015-11-07 DIAGNOSIS — Z87891 Personal history of nicotine dependence: Secondary | ICD-10-CM

## 2015-11-07 DIAGNOSIS — N261 Atrophy of kidney (terminal): Secondary | ICD-10-CM | POA: Diagnosis present

## 2015-11-07 DIAGNOSIS — D4102 Neoplasm of uncertain behavior of left kidney: Secondary | ICD-10-CM | POA: Diagnosis not present

## 2015-11-07 DIAGNOSIS — Q6211 Congenital occlusion of ureteropelvic junction: Secondary | ICD-10-CM

## 2015-11-07 DIAGNOSIS — N401 Enlarged prostate with lower urinary tract symptoms: Secondary | ICD-10-CM | POA: Diagnosis present

## 2015-11-07 DIAGNOSIS — N1330 Unspecified hydronephrosis: Secondary | ICD-10-CM | POA: Diagnosis present

## 2015-11-07 DIAGNOSIS — Z9889 Other specified postprocedural states: Secondary | ICD-10-CM

## 2015-11-07 DIAGNOSIS — Z96651 Presence of right artificial knee joint: Secondary | ICD-10-CM | POA: Diagnosis present

## 2015-11-07 HISTORY — PX: LAPAROSCOPIC NEPHRECTOMY, HAND ASSISTED: SHX1929

## 2015-11-07 LAB — CBC
HCT: 38.6 % — ABNORMAL LOW (ref 40.0–52.0)
HEMOGLOBIN: 13.3 g/dL (ref 13.0–18.0)
MCH: 31.5 pg (ref 26.0–34.0)
MCHC: 34.4 g/dL (ref 32.0–36.0)
MCV: 91.5 fL (ref 80.0–100.0)
Platelets: 147 10*3/uL — ABNORMAL LOW (ref 150–440)
RBC: 4.22 MIL/uL — AB (ref 4.40–5.90)
RDW: 14.6 % — ABNORMAL HIGH (ref 11.5–14.5)
WBC: 11.3 10*3/uL — ABNORMAL HIGH (ref 3.8–10.6)

## 2015-11-07 LAB — ABO/RH: ABO/RH(D): A POS

## 2015-11-07 SURGERY — NEPHRECTOMY, HAND-ASSISTED, LAPAROSCOPIC
Anesthesia: General | Site: Abdomen | Laterality: Left | Wound class: Clean Contaminated

## 2015-11-07 MED ORDER — PROPOFOL 10 MG/ML IV BOLUS
INTRAVENOUS | Status: DC | PRN
  Administered 2015-11-07: 150 mg via INTRAVENOUS

## 2015-11-07 MED ORDER — PHENYLEPHRINE HCL 10 MG/ML IJ SOLN
INTRAMUSCULAR | Status: DC | PRN
  Administered 2015-11-07 (×3): 100 ug via INTRAVENOUS

## 2015-11-07 MED ORDER — HYDROMORPHONE HCL 1 MG/ML IJ SOLN
0.5000 mg | INTRAMUSCULAR | Status: AC | PRN
  Administered 2015-11-07 (×4): 0.5 mg via INTRAVENOUS

## 2015-11-07 MED ORDER — NEOSTIGMINE METHYLSULFATE 10 MG/10ML IV SOLN
INTRAVENOUS | Status: DC | PRN
  Administered 2015-11-07: 5 mg via INTRAVENOUS

## 2015-11-07 MED ORDER — HYDRALAZINE HCL 20 MG/ML IJ SOLN
INTRAMUSCULAR | Status: DC | PRN
  Administered 2015-11-07: 10 mg via INTRAVENOUS
  Administered 2015-11-07: 5 mg via INTRAVENOUS

## 2015-11-07 MED ORDER — TAMSULOSIN HCL 0.4 MG PO CAPS
0.4000 mg | ORAL_CAPSULE | Freq: Every day | ORAL | Status: DC
  Administered 2015-11-07 – 2015-11-08 (×2): 0.4 mg via ORAL
  Filled 2015-11-07 (×2): qty 1

## 2015-11-07 MED ORDER — CEFAZOLIN IN D5W 1 GM/50ML IV SOLN
1.0000 g | Freq: Three times a day (TID) | INTRAVENOUS | Status: AC
  Administered 2015-11-07 – 2015-11-08 (×2): 1 g via INTRAVENOUS
  Filled 2015-11-07 (×2): qty 50

## 2015-11-07 MED ORDER — HEPARIN SODIUM (PORCINE) 5000 UNIT/ML IJ SOLN
5000.0000 [IU] | Freq: Three times a day (TID) | INTRAMUSCULAR | Status: DC
  Administered 2015-11-07 – 2015-11-08 (×3): 5000 [IU] via SUBCUTANEOUS
  Filled 2015-11-07 (×3): qty 1

## 2015-11-07 MED ORDER — SCOPOLAMINE 1 MG/3DAYS TD PT72
1.0000 | MEDICATED_PATCH | TRANSDERMAL | Status: DC
  Administered 2015-11-07: 1.5 mg via TRANSDERMAL

## 2015-11-07 MED ORDER — OXYCODONE HCL 5 MG PO TABS: 5.0000 mg | ORAL_TABLET | Freq: Once | ORAL | Status: DC | PRN

## 2015-11-07 MED ORDER — LIDOCAINE HCL (CARDIAC) 20 MG/ML IV SOLN
INTRAVENOUS | Status: DC | PRN
  Administered 2015-11-07: 100 mg via INTRAVENOUS

## 2015-11-07 MED ORDER — BUPIVACAINE LIPOSOME 1.3 % IJ SUSP
INTRAMUSCULAR | Status: AC
  Filled 2015-11-07: qty 20

## 2015-11-07 MED ORDER — FENTANYL CITRATE (PF) 100 MCG/2ML IJ SOLN
INTRAMUSCULAR | Status: AC
  Filled 2015-11-07: qty 2

## 2015-11-07 MED ORDER — MEPERIDINE HCL 25 MG/ML IJ SOLN: 6.2500 mg | INTRAMUSCULAR | Status: DC | PRN

## 2015-11-07 MED ORDER — PROMETHAZINE HCL 25 MG/ML IJ SOLN: 6.2500 mg | INTRAMUSCULAR | Status: DC | PRN

## 2015-11-07 MED ORDER — SIMVASTATIN 40 MG PO TABS
40.0000 mg | ORAL_TABLET | Freq: Every day | ORAL | Status: DC
  Administered 2015-11-07: 40 mg via ORAL
  Filled 2015-11-07: qty 1

## 2015-11-07 MED ORDER — SODIUM CHLORIDE 0.9 % IV SOLN
INTRAVENOUS | Status: DC
  Administered 2015-11-07 – 2015-11-08 (×3): via INTRAVENOUS

## 2015-11-07 MED ORDER — LABETALOL HCL 5 MG/ML IV SOLN
INTRAVENOUS | Status: DC | PRN
  Administered 2015-11-07: 5 mg via INTRAVENOUS

## 2015-11-07 MED ORDER — CEFAZOLIN SODIUM-DEXTROSE 2-4 GM/100ML-% IV SOLN
2.0000 g | INTRAVENOUS | Status: AC
  Administered 2015-11-07: 2 g via INTRAVENOUS

## 2015-11-07 MED ORDER — GLYCOPYRROLATE 0.2 MG/ML IJ SOLN
INTRAMUSCULAR | Status: DC | PRN
  Administered 2015-11-07: 1 mg via INTRAVENOUS

## 2015-11-07 MED ORDER — MORPHINE SULFATE (PF) 2 MG/ML IV SOLN
2.0000 mg | INTRAVENOUS | Status: DC | PRN
  Administered 2015-11-07 – 2015-11-08 (×3): 2 mg via INTRAVENOUS
  Filled 2015-11-07 (×3): qty 1

## 2015-11-07 MED ORDER — DIPHENHYDRAMINE HCL 12.5 MG/5ML PO ELIX: 12.5000 mg | ORAL_SOLUTION | Freq: Four times a day (QID) | ORAL | Status: DC | PRN

## 2015-11-07 MED ORDER — PANTOPRAZOLE SODIUM 40 MG PO TBEC
80.0000 mg | DELAYED_RELEASE_TABLET | Freq: Every day | ORAL | Status: DC
  Administered 2015-11-07 – 2015-11-08 (×2): 80 mg via ORAL
  Filled 2015-11-07 (×2): qty 2

## 2015-11-07 MED ORDER — CEFAZOLIN SODIUM-DEXTROSE 2-4 GM/100ML-% IV SOLN
INTRAVENOUS | Status: AC
  Filled 2015-11-07: qty 100

## 2015-11-07 MED ORDER — FENTANYL CITRATE (PF) 100 MCG/2ML IJ SOLN
25.0000 ug | INTRAMUSCULAR | Status: DC | PRN
  Administered 2015-11-07: 50 ug via INTRAVENOUS
  Administered 2015-11-07: 25 ug via INTRAVENOUS
  Administered 2015-11-07: 50 ug via INTRAVENOUS
  Administered 2015-11-07: 25 ug via INTRAVENOUS

## 2015-11-07 MED ORDER — BUPIVACAINE HCL (PF) 0.5 % IJ SOLN
INTRAMUSCULAR | Status: AC
  Filled 2015-11-07: qty 30

## 2015-11-07 MED ORDER — OXYCODONE-ACETAMINOPHEN 5-325 MG PO TABS
1.0000 | ORAL_TABLET | ORAL | Status: DC | PRN
  Administered 2015-11-07 – 2015-11-08 (×3): 2 via ORAL
  Filled 2015-11-07 (×3): qty 2

## 2015-11-07 MED ORDER — LORATADINE 10 MG PO TABS
10.0000 mg | ORAL_TABLET | Freq: Every day | ORAL | Status: DC
  Administered 2015-11-08: 10 mg via ORAL
  Filled 2015-11-07: qty 1

## 2015-11-07 MED ORDER — BELLADONNA ALKALOIDS-OPIUM 16.2-60 MG RE SUPP: 1.0000 | Freq: Four times a day (QID) | RECTAL | Status: DC | PRN

## 2015-11-07 MED ORDER — THROMBIN 5000 UNITS EX KIT
PACK | CUTANEOUS | Status: DC | PRN
  Administered 2015-11-07: 5000 [IU] via TOPICAL

## 2015-11-07 MED ORDER — ACETAMINOPHEN 325 MG PO TABS: 650.0000 mg | ORAL_TABLET | ORAL | Status: DC | PRN

## 2015-11-07 MED ORDER — LISINOPRIL 20 MG PO TABS
30.0000 mg | ORAL_TABLET | Freq: Every day | ORAL | Status: DC
  Administered 2015-11-08: 30 mg via ORAL
  Filled 2015-11-07: qty 1

## 2015-11-07 MED ORDER — FENTANYL CITRATE (PF) 100 MCG/2ML IJ SOLN
INTRAMUSCULAR | Status: DC | PRN
  Administered 2015-11-07: 50 ug via INTRAVENOUS
  Administered 2015-11-07: 200 ug via INTRAVENOUS
  Administered 2015-11-07: 50 ug via INTRAVENOUS
  Administered 2015-11-07: 100 ug via INTRAVENOUS
  Administered 2015-11-07: 200 ug via INTRAVENOUS

## 2015-11-07 MED ORDER — FINASTERIDE 5 MG PO TABS
5.0000 mg | ORAL_TABLET | Freq: Every day | ORAL | Status: DC
  Administered 2015-11-08: 5 mg via ORAL
  Filled 2015-11-07: qty 1

## 2015-11-07 MED ORDER — THROMBIN 5000 UNITS EX SOLR
CUTANEOUS | Status: AC
  Filled 2015-11-07: qty 5000

## 2015-11-07 MED ORDER — OXYBUTYNIN CHLORIDE 5 MG PO TABS
5.0000 mg | ORAL_TABLET | Freq: Three times a day (TID) | ORAL | Status: DC | PRN
  Administered 2015-11-07: 5 mg via ORAL
  Filled 2015-11-07: qty 1

## 2015-11-07 MED ORDER — DOCUSATE SODIUM 100 MG PO CAPS
100.0000 mg | ORAL_CAPSULE | Freq: Two times a day (BID) | ORAL | Status: DC
  Administered 2015-11-07 – 2015-11-08 (×2): 100 mg via ORAL
  Filled 2015-11-07 (×2): qty 1

## 2015-11-07 MED ORDER — LACTATED RINGERS IV SOLN
INTRAVENOUS | Status: DC
  Administered 2015-11-07 (×2): via INTRAVENOUS

## 2015-11-07 MED ORDER — SCOPOLAMINE 1 MG/3DAYS TD PT72
MEDICATED_PATCH | TRANSDERMAL | Status: AC
  Filled 2015-11-07: qty 1

## 2015-11-07 MED ORDER — HYDROMORPHONE HCL 1 MG/ML IJ SOLN
INTRAMUSCULAR | Status: AC
  Filled 2015-11-07: qty 1

## 2015-11-07 MED ORDER — MIDAZOLAM HCL 2 MG/2ML IJ SOLN
INTRAMUSCULAR | Status: DC | PRN
  Administered 2015-11-07: 1 mg via INTRAVENOUS

## 2015-11-07 MED ORDER — ONDANSETRON HCL 4 MG/2ML IJ SOLN
INTRAMUSCULAR | Status: DC | PRN
  Administered 2015-11-07: 4 mg via INTRAVENOUS

## 2015-11-07 MED ORDER — SODIUM CHLORIDE FLUSH 0.9 % IV SOLN
INTRAVENOUS | Status: AC
  Filled 2015-11-07: qty 10

## 2015-11-07 MED ORDER — DIPHENHYDRAMINE HCL 50 MG/ML IJ SOLN: 12.5000 mg | Freq: Four times a day (QID) | INTRAMUSCULAR | Status: DC | PRN

## 2015-11-07 MED ORDER — ROCURONIUM BROMIDE 100 MG/10ML IV SOLN
INTRAVENOUS | Status: DC | PRN
  Administered 2015-11-07: 30 mg via INTRAVENOUS
  Administered 2015-11-07: 20 mg via INTRAVENOUS
  Administered 2015-11-07: 50 mg via INTRAVENOUS
  Administered 2015-11-07: 10 mg via INTRAVENOUS
  Administered 2015-11-07: 20 mg via INTRAVENOUS
  Administered 2015-11-07: 10 mg via INTRAVENOUS

## 2015-11-07 MED ORDER — GENTAMICIN IN SALINE 1.6-0.9 MG/ML-% IV SOLN
80.0000 mg | INTRAVENOUS | Status: AC
  Administered 2015-11-07: 80 mg via INTRAVENOUS
  Filled 2015-11-07: qty 50

## 2015-11-07 MED ORDER — KETAMINE HCL 50 MG/ML IJ SOLN
INTRAMUSCULAR | Status: DC | PRN
  Administered 2015-11-07: 50 mg via INTRAVENOUS

## 2015-11-07 MED ORDER — SODIUM CHLORIDE 0.9 % IJ SOLN
INTRAMUSCULAR | Status: AC
  Filled 2015-11-07: qty 20

## 2015-11-07 MED ORDER — ASPIRIN EC 81 MG PO TBEC
81.0000 mg | DELAYED_RELEASE_TABLET | Freq: Every day | ORAL | Status: DC
  Administered 2015-11-08: 81 mg via ORAL
  Filled 2015-11-07: qty 1

## 2015-11-07 MED ORDER — DOCUSATE SODIUM 100 MG PO CAPS: 100.0000 mg | ORAL_CAPSULE | Freq: Two times a day (BID) | ORAL | Status: DC

## 2015-11-07 MED ORDER — FLUTICASONE PROPIONATE 50 MCG/ACT NA SUSP
2.0000 | Freq: Every day | NASAL | Status: DC
  Administered 2015-11-08: 2 via NASAL
  Filled 2015-11-07: qty 16

## 2015-11-07 MED ORDER — ONDANSETRON HCL 4 MG/2ML IJ SOLN: 4.0000 mg | INTRAMUSCULAR | Status: DC | PRN

## 2015-11-07 MED ORDER — BUPIVACAINE LIPOSOME 1.3 % IJ SUSP
INTRAMUSCULAR | Status: DC | PRN
  Administered 2015-11-07: 40 mL

## 2015-11-07 MED ORDER — OXYCODONE HCL 5 MG/5ML PO SOLN: 5.0000 mg | Freq: Once | ORAL | Status: DC | PRN

## 2015-11-07 SURGICAL SUPPLY — 74 items
ANCHOR TIS RET SYS 1550ML (BAG) ×2 IMPLANT
APPLICATOR SURGIFLO (MISCELLANEOUS) ×2 IMPLANT
APPLIER CLIP ROT 10 11.4 M/L (STAPLE) ×2
APPLIER CLIP ROT 13.4 12 LRG (CLIP) ×2
CANISTER SUCT 1200ML W/VALVE (MISCELLANEOUS) ×2 IMPLANT
CATH TRAY 16F METER LATEX (MISCELLANEOUS) ×2 IMPLANT
CHLORAPREP W/TINT 26ML (MISCELLANEOUS) ×2 IMPLANT
CLEANER CAUTERY TIP 5X5 PAD (MISCELLANEOUS) ×1 IMPLANT
CLIP APPLIE ROT 10 11.4 M/L (STAPLE) ×1 IMPLANT
CLIP APPLIE ROT 13.4 12 LRG (CLIP) ×1 IMPLANT
CLIP LIGATING HEM O LOK PURPLE (MISCELLANEOUS) ×6 IMPLANT
CORD BIP STRL DISP 12FT (MISCELLANEOUS) IMPLANT
CUTTER ECHEON FLEX ENDO 45 340 (ENDOMECHANICALS) IMPLANT
DEFOGGER SCOPE WARMER CLEARIFY (MISCELLANEOUS) ×2 IMPLANT
DISSECTOR KITTNER STICK (MISCELLANEOUS) ×1 IMPLANT
DISSECTORS/KITTNER STICK (MISCELLANEOUS) ×2
DRAPE INCISE IOBAN 66X45 STRL (DRAPES) ×2 IMPLANT
DRAPE SURG 17X11 SM STRL (DRAPES) ×8 IMPLANT
DRSG TEGADERM 2-3/8X2-3/4 SM (GAUZE/BANDAGES/DRESSINGS) IMPLANT
DRSG TEGADERM 4X4.75 (GAUZE/BANDAGES/DRESSINGS) ×2 IMPLANT
DRSG TELFA 3X8 NADH (GAUZE/BANDAGES/DRESSINGS) IMPLANT
ELECT REM PT RETURN 9FT ADLT (ELECTROSURGICAL) ×2
ELECTRODE REM PT RTRN 9FT ADLT (ELECTROSURGICAL) ×1 IMPLANT
GELPORT LAPAROSCOPIC (MISCELLANEOUS) ×2 IMPLANT
GLOVE BIO SURGEON STRL SZ 6.5 (GLOVE) ×8 IMPLANT
GLOVE INDICATOR 6.5 STRL GRN (GLOVE) ×2 IMPLANT
GOWN STRL REUS W/ TWL LRG LVL3 (GOWN DISPOSABLE) ×3 IMPLANT
GOWN STRL REUS W/TWL LRG LVL3 (GOWN DISPOSABLE) ×3
GRASPER SUT TROCAR 14GX15 (MISCELLANEOUS) ×2 IMPLANT
HANDLE YANKAUER SUCT BULB TIP (MISCELLANEOUS) IMPLANT
HOLDER FOLEY CATH W/STRAP (MISCELLANEOUS) ×2 IMPLANT
IRRIGATION STRYKERFLOW (MISCELLANEOUS) ×1 IMPLANT
IRRIGATOR STRYKERFLOW (MISCELLANEOUS) ×2
IV NS 1000ML (IV SOLUTION) ×1
IV NS 1000ML BAXH (IV SOLUTION) ×1 IMPLANT
KIT PINK PAD W/HEAD ARE REST (MISCELLANEOUS) ×2
KIT PINK PAD W/HEAD ARM REST (MISCELLANEOUS) ×1 IMPLANT
KIT RM TURNOVER STRD PROC AR (KITS) ×2 IMPLANT
L-HOOK LAP DISP 36CM (ELECTROSURGICAL) ×2
LABEL OR SOLS (LABEL) ×2 IMPLANT
LHOOK LAP DISP 36CM (ELECTROSURGICAL) ×1 IMPLANT
LIGASURE LAP ATLAS 10MM 37CM (INSTRUMENTS) ×2 IMPLANT
LIQUID BAND (GAUZE/BANDAGES/DRESSINGS) ×2 IMPLANT
LOOP RED MAXI  1X406MM (MISCELLANEOUS) ×1
LOOP VESSEL MAXI 1X406 RED (MISCELLANEOUS) ×1 IMPLANT
NEEDLE HYPO 25X1 1.5 SAFETY (NEEDLE) ×2 IMPLANT
NEEDLE INSUFFLATION 14GA 120MM (NEEDLE) ×2 IMPLANT
PACK LAP CHOLECYSTECTOMY (MISCELLANEOUS) ×2 IMPLANT
PAD CLEANER CAUTERY TIP 5X5 (MISCELLANEOUS) ×1
PENCIL ELECTRO HAND CTR (MISCELLANEOUS) ×2 IMPLANT
SCISSORS METZENBAUM CVD 33 (INSTRUMENTS) ×2 IMPLANT
SPOGE SURGIFLO 8M (HEMOSTASIS) ×1
SPONGE LAP 18X18 5 PK (GAUZE/BANDAGES/DRESSINGS) ×2 IMPLANT
SPONGE SURGIFLO 8M (HEMOSTASIS) ×1 IMPLANT
STAPLE RELOAD 2.5MM WHITE (STAPLE) ×10 IMPLANT
STAPLER SKIN PROX 35W (STAPLE) ×2 IMPLANT
STAPLER VASCULAR ECHELON 35 (CUTTER) ×2 IMPLANT
STRIP CLOSURE SKIN 1/2X4 (GAUZE/BANDAGES/DRESSINGS) ×2 IMPLANT
SURGILUBE 2OZ TUBE FLIPTOP (MISCELLANEOUS) ×2 IMPLANT
SUT CHROMIC 0 CT 1 (SUTURE) IMPLANT
SUT MNCRL AB 4-0 PS2 18 (SUTURE) ×4 IMPLANT
SUT PDS AB 0 CT1 27 (SUTURE) IMPLANT
SUT VIC AB 0 CT1 36 (SUTURE) ×4 IMPLANT
SUT VIC AB 0 CT2 27 (SUTURE) ×2 IMPLANT
SUT VIC AB 1 CT1 36 (SUTURE) ×4 IMPLANT
SUT VIC AB 2-0 SH 27 (SUTURE)
SUT VIC AB 2-0 SH 27XBRD (SUTURE) IMPLANT
SUT VIC AB 2-0 UR6 27 (SUTURE) IMPLANT
SUT VIC AB 4-0 FS2 27 (SUTURE) IMPLANT
SUT VIC AB 4-0 KS 27 (SUTURE) ×2 IMPLANT
TROCAR ENDOPATH XCEL 12X100 BL (ENDOMECHANICALS) ×2 IMPLANT
TROCAR XCEL 12X100 BLDLESS (ENDOMECHANICALS) ×4 IMPLANT
TROCAR XCEL NON-BLD 5MMX100MML (ENDOMECHANICALS) ×2 IMPLANT
TUBING INSUFFLATOR HI FLOW (MISCELLANEOUS) ×2 IMPLANT

## 2015-11-07 NOTE — Op Note (Addendum)
PREOP DIAGNOSIS: Left renal mass  POSTOPERATIVE DIAGNOSIS: Left renal mass  OPERATION PERFORMED: Hand-assisted laparoscopic left radical nephrectomy.   SURGEON: Hollice Espy, MD   Assistant: Link Snuffer   ANESTHESIA: General.   ESTIMATED BLOOD LOSS: 100  cc   DRAINS: 16-French Foley catheter.   COMPLICATIONS: None.  Indications: Atophic severely hydronephrotic left kidney  FINDINGS: Hilar anatomy: Arteries: 1  Veins: 2 Ureters: 1   DESCRIPTION OF OPERATION: Informed consent was obtained. The patient was marked on the left side. IV antibiotics were given for bacterial prophylaxis on call to the operating room. SCDs were provided for DVT prophylaxis. The patient was taken to the operating room and placed supine on the operating table. General anesthesia was provided. A Foley catheter was placed to drain the bladder.  The patient was positioned in right lateral decubitus with the left flank elevated about 70 degrees and the table flexed slightly. The left arm was placed in a padded airplane for support. Axillary roll was positioned. The patient was secured to the table with soft straps and then prepped and draped sterilely.   We had a time-out confirming the patient identification, planned procedure, surgical site, and all present were in agreement. All present were in agreement.   A 8 cm incision was made for the hand port in the left lower quadrant. The anterior fascia was incised and elevated. The rectus belly was retracted medially and the peritoneum was incised. An incisional block was provided with liposomal Marcaine. The GelPort was assembled. A 12 mm trocar was placed above the umbilicus, and then the abdomen was insufflated. Laparoscopic survey revealed no abnormalities or injuries. An additional 12 mm trocar was just below  the subxiphoid. All port sites were then infiltrated with liposomal Marcaine. Zero Vicryls were placed at the 12 mm  trocar sites with the Carter-Thomason device for closure at the end of the case.   The white line of Toldt was incised. The colon was reflected from the spleen to the pelvis. Gerota's fascia was lifted up off the lower pole of the kidney and the ureter and gonadal vessels were identified. The gonadal vein was exposed and followed up to the main renal vein. The branch point of the gonadal vein and adrenal vein were exposed at the renal vein.  The gonadal vein was ligated due to bleeding. The upper pole of the kidney was mobilized off of the quadratus lumborum and further mobilized away from the spleen.  The renal pelvis was severely redundant and floppy with a rind like core. The adrenal gland was identified and spared during the upper pole dissection. The lower pole and lateral attachments were freed. The kidney was held laterally and the hilar dissection was completed.    The ureter was exposed, clipped distally using 12 mm clips, and divided sharply. The ureter stump was confirmed hemostatic.  At this point, the renal artery along with an upper pole branch of the renal rein was divided with a 45 mm vascular load staple using the battery operated endovascular stapler. Next, the lower pole renal vein was divided with a second staple load.  An endocatch bag was then used to bag the specimen.  The kidney was extracted through the gel port and passed off for pathological analysis.    Pneumoperitoneal pressure was reduced to 7 mmHg and the abdomen was inspected; hemostasis was confirmed. The 12 mm trocars were removed and the port sites closed with previously placed 0 Vicryl. The Gelport was removed. The anterior fascia  was closed with a running 0 Vicryl x 2.  The subcutaneous tissue was closed using 3-0 vicryl. All the incisions were irrigated, patted dry, and then the skin was reapproximated with 4-0 Monocryl in a subcuticular fashion. The wounds were cleaned and dried and covered with  Dermabond. All sponge, needle, and instrument counts were reported correct x2.   The patient was awakened from anesthesia and transferred to recovery in stable condition. There were no complications. The patient tolerated the procedure well. ______________________________  Link Snuffer  PA was necessary as an assistant during this procedure for camera manipulation, assistance with retraction, assistants with wound closure, positioning, amongst other tasks. The surgical procedure would not be possible without an Environmental consultant.  Hollice Espy, MD

## 2015-11-07 NOTE — Anesthesia Procedure Notes (Signed)
Procedure Name: Intubation Date/Time: 11/07/2015 1:33 PM Performed by: Rosaria Ferries, Koraline Phillipson Pre-anesthesia Checklist: Patient identified, Emergency Drugs available, Suction available and Patient being monitored Patient Re-evaluated:Patient Re-evaluated prior to inductionOxygen Delivery Method: Circle system utilized Preoxygenation: Pre-oxygenation with 100% oxygen Intubation Type: IV induction Laryngoscope Size: Mac and 3 Grade View: Grade III Tube size: 7.0 mm Placement Confirmation: positive ETCO2 and breath sounds checked- equal and bilateral Secured at: 23 cm Tube secured with: Tape Dental Injury: Teeth and Oropharynx as per pre-operative assessment

## 2015-11-07 NOTE — Anesthesia Preprocedure Evaluation (Signed)
Anesthesia Evaluation  Patient identified by MRN, date of birth, ID band Patient awake    Reviewed: Allergy & Precautions, NPO status , Patient's Chart, lab work & pertinent test results  History of Anesthesia Complications Negative for: history of anesthetic complications  Airway Mallampati: III  TM Distance: >3 FB Neck ROM: Full    Dental  (+) Poor Dentition   Pulmonary neg sleep apnea, neg COPD, former smoker,    breath sounds clear to auscultation- rhonchi (-) wheezing      Cardiovascular Exercise Tolerance: Good hypertension, Pt. on medications (-) CAD and (-) Past MI  Rhythm:Regular Rate:Normal - Systolic murmurs and - Diastolic murmurs    Neuro/Psych negative neurological ROS  negative psych ROS   GI/Hepatic Neg liver ROS, hiatal hernia, GERD  Medicated,  Endo/Other  negative endocrine ROSneg diabetes  Renal/GU Renal disease     Musculoskeletal  (+) Arthritis ,   Abdominal (+) - obese,   Peds  Hematology negative hematology ROS (+)   Anesthesia Other Findings Past Medical History: No date: BPH (benign prostatic hyperplasia) No date: Cancer (HCC)     Comment: skin cancer  No date: GERD (gastroesophageal reflux disease) No date: History of hiatal hernia No date: Hypercholesterolemia No date: Hypertension   Reproductive/Obstetrics                             Anesthesia Physical Anesthesia Plan  ASA: III  Anesthesia Plan: General   Post-op Pain Management:    Induction: Intravenous  Airway Management Planned: Oral ETT  Additional Equipment:   Intra-op Plan:   Post-operative Plan: Extubation in OR  Informed Consent: I have reviewed the patients History and Physical, chart, labs and discussed the procedure including the risks, benefits and alternatives for the proposed anesthesia with the patient or authorized representative who has indicated his/her understanding and  acceptance.   Dental advisory given  Plan Discussed with: CRNA and Anesthesiologist  Anesthesia Plan Comments:         Anesthesia Quick Evaluation

## 2015-11-07 NOTE — H&P (Signed)
10/06/2015 --> updated 11/07/15 9:01 AM   Craig [name] Jul 29, 1945 [id number]  Referring provider: Tracie Harrier, MD [id number] [facility] [place], [place] [address]     Chief Complaint  Patient presents with  . 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 During his first admission, he was treated conservatively with antibiotics in the form of Levaquin and improved, however, returned to the emergency room 5 days later with recurrent fevers and ultimately underwent left percutaneous nephrostomy tube placement. Per report, frank pus was drained from his kidney.   UCx from voided urine grew Enterococcus but UA negative.  Currently on Levaquin.    No further fevers.  Flank pain improved with nephrotomy tube.    He does have chronic left flank pain at baseline.  He has been told that he has a UPJ obstruction in the past but no intervention was ever recommended.  During his admissions, he also had difficulty voiding and failed a voiding trial. He is a personal history of BPH and is on finasteride and Flomax chronically. He was previously managed by Dr. Bernardo Heater  He continues to have severe urinary frequency q10-12 times night and day time frequency.  He also has urinary urgency but unable start his stream for at least 10-15 seconds at times.  No incontinence.   His stream is "OK".  He notes that he has been dribbling for years but the urgency/ frequency is new.  Since starting levaquin, he feeling that his is able to empty better but the frequency has not improved.    In terms of urological history, he does have history of urinary retention following the replacement surgery in 2016. He also has a previous history of elevated PSA and a status post negative biopsy many years ago by Dr.  Yves Dill.  No recent PSA data although per report was 1.7 in 2012 during an active UTI.  No previous abdominal surgery.    PMH:     Past Medical History:  Diagnosis Date  . BPH (benign prostatic hyperplasia)   . Hypercholesterolemia   . Hypertension     Surgical History:      Past Surgical History:  Procedure Laterality Date  . IR GENERIC HISTORICAL  09/29/2015   IR NEPHROSTOMY PLACEMENT LEFT 09/29/2015 [name], MD [facility]INTER[facility]  . REPLACEMENT TOTAL KNEE Right     Home Medications:        Medication List           Accurate as of 10/06/15  9:01 AM. Always use your most recent med list.           cetirizine 10 MG tablet Commonly known as:  ZYRTEC Take 10 mg by mouth daily.  docusate sodium 100 MG capsule Commonly known as:  COLACE Take 100 mg by mouth 2 (two) times daily.  finasteride 5 MG tablet Commonly known as:  PROSCAR Take 5 mg by mouth daily.  fluticasone 50 MCG/ACT nasal spray Commonly known as:  FLONASE Place 2 sprays into both nostrils daily.  levofloxacin 250 MG tablet Commonly known as:  LEVAQUIN Take 1 tablet (250 mg total) by mouth daily.  lisinopril 30 MG tablet Commonly known as:  PRINIVIL,ZESTRIL Take 30 mg by mouth daily.  multivitamin tablet Take 1 tablet by mouth daily.  omeprazole 40 MG capsule Commonly known as:  PRILOSEC Take 40 mg by mouth 2 (two) times daily.  simvastatin 40 MG tablet Commonly known as:  ZOCOR Take 40 mg by mouth daily.  tamsulosin 0.4 MG Caps capsule Commonly known as:  FLOMAX Take 0.4 mg by mouth daily.  traMADol 50 MG tablet Commonly known as:  ULTRAM Take 50 mg by mouth 3 (three) times daily.  TYLENOL 8 HOUR ARTHRITIS PAIN 650 MG CR tablet Generic drug:  acetaminophen Take 650 mg by mouth every 8 (eight) hours as needed for pain.  vitamin E 400 UNIT capsule Take 400 Units by mouth daily.      Allergies:       Allergies  Allergen Reactions  . Peanut Oil Other (See Comments)      Mouth ulcers Mouth ulcers    Family History:      Family History  Problem Relation Age of Onset  . Kidney cancer Mother   . Lung cancer Father   . Prostate cancer Neg Hx   . Bladder Cancer Neg Hx     Social History:  reports that he quit smoking about 32 years ago. He has a 30.00 pack-year smoking history. He has never used smokeless tobacco. He reports that he does not drink alcohol or use drugs.  ROS: UROLOGY Frequent Urination?: Yes Hard to postpone urination?: Yes Burning/pain with urination?: No Get up at night to urinate?: Yes Leakage of urine?: No Urine stream starts and stops?: Yes Trouble starting stream?: Yes Do you have to strain to urinate?: No Blood in urine?: No Urinary tract infection?: No Sexually transmitted disease?: No Injury to kidneys or bladder?: No Painful intercourse?: No Weak stream?: No Erection problems?: No Penile pain?: No  Gastrointestinal Nausea?: No Vomiting?: No Indigestion/heartburn?: Yes Diarrhea?: No Constipation?: No  Constitutional Fever: No Night sweats?: Yes Weight loss?: No Fatigue?: Yes  Skin Skin rash/lesions?: No Itching?: No  Eyes Blurred vision?: No Double vision?: No  Ears/Nose/Throat Sore throat?: No Sinus problems?: Yes  Hematologic/Lymphatic Swollen glands?: No Easy bruising?: No  Cardiovascular Leg swelling?: No Chest pain?: No  Respiratory Cough?: No Shortness of breath?: No  Endocrine Excessive thirst?: No  Musculoskeletal Back pain?: Yes Joint pain?: No  Neurological Headaches?: Yes Dizziness?: Yes  Psychologic Depression?: No Anxiety?: No  Physical Exam: BP (!) 160/88 (BP Location: Left Arm, Patient Position: Sitting, Cuff Size: Normal)   Pulse 69   Ht 5\' 11"  (1.803 m)   Wt 221 lb (100.2 kg)   BMI 30.82 kg/m   Constitutional:  Alert and oriented, No acute distress. HEENT: [place] AT, moist mucus membranes.  Trachea midline, no  masses. Cardiovascular: No clubbing, cyanosis, or edema. RRR. Respiratory: Normal respiratory effort, no increased work of breathing.  CTAB.  GI: Abdomen is soft, nontender, nondistended, no abdominal masses GU: No CVA tenderness. Left nephrostomy tube in place during clear yellow urine. Rectal exam: Normal sphincter tone, 50+ cc prostate, diffusely firm,  No nodules, nontender. Skin: No rashes, bruises or suspicious lesions. Lymph: No cervical or inguinal adenopathy. Neurologic: Grossly intact, no focal deficits, moving all 4 extremities. Psychiatric: Normal mood and affect.  Laboratory Data: RecentLabs       Lab Results  Component Value Date   WBC 8.4 09/30/2015   HGB 11.2 (L) 09/30/2015   HCT 31.9 (L) 09/30/2015   MCV 91.2 09/30/2015   PLT 166 09/30/2015      RecentLabs       Lab Results  Component Value Date   CREATININE 1.14 09/30/2015  Pertinent Imaging:      Results for orders placed or performed in visit on 10/06/15  Bladder Scan (Post Void Residual) in office  Result Value Ref Range   Scan Result 187     CLINICAL DATA: 70 year old male with urinary retention and chills since 1600 hours yesterday. Initial encounter.  EXAM: CT ABDOMEN AND PELVIS WITH CONTRAST  TECHNIQUE: Multidetector CT imaging of the abdomen and pelvis was performed using the standard protocol following bolus administration of intravenous contrast.  CONTRAST: 137mL ISOVUE-300 IOPAMIDOL (ISOVUE-300) INJECTION 61%  COMPARISON: CT abdomen without and with contrast 08/16/2013. Report of nuclear medicine renal scan with Lasix 04/03/2001 (no images available).  FINDINGS: Mild linear atelectasis and respiratory motion artifact at the lung bases. No pericardial or pleural effusion.  No acute osseous abnormality identified.  There is a small volume of pelvic free fluid situated adjacent to distal small bowel loops. The urinary bladder is mildly  to moderately distended, otherwise appears normal. See renal findings below.  Negative rectum. Redundant sigmoid colon with mild diverticulosis, but no active inflammation. The left colon is negative aside from being displaced and compressed into the left lateral abdomen by the abnormal left kidney described below. Negative transverse colon. Oral contrast has reached the mid transverse colon. Redundant hepatic flexure. Negative right colon and appendix. Negative terminal ileum. No dilated small bowel. Small gastric hiatal has increased since 2015. Negative duodenum.  No abdominal free air or free fluid. Negative liver, gallbladder, pancreas. Portal venous system is patent. Aortoiliac calcified atherosclerosis noted. Major arterial structures are patent.  There is mild mass effect on the spleen and regional soft tissue stranding which emanates from the left kidney. Mild mass effect and stranding at the left adrenal gland as well. There is chronic severe left hydronephrosis and thinning of the left renal parenchyma. In 2015 the markedly enlarged left renal collecting system measured up to about 16.5 cm.  Today the abnormal left kidney has further enlarged, with a massively dilated collecting system up to 19.3 cm . Also, the left kidney appears acutely inflamed with new inflammatory stranding throughout the left para renal space. The left kidney is also hypoenhancing relative to that on the right, but this is chronic to an extent. The left ureter appears to be decompressed, and is difficult to identify at its junction with the massively dilated left renal pelvis. No urologic calculus or nephrocalcinosis. Mild, reactive appearing retroperitoneal lymph nodes on the left. Secondary inflammation of the ventral surface of the left psoas muscle. There is also mild secondary stranding in the mesentery of the left splenic flexure. As expected on delayed renal images there is contrast  excretion from the right kidney, but non on the left.  IMPRESSION: 1. Chronic markedly dilated left kidney has further enlarged since 2015 (massively dilated) and is newly inflamed with confluent left pararenal stranding. Reactive left retroperitoneal lymphadenopathy and secondary inflammation of the adjacent left colon, spleen and psoas muscle. Suspect Acute Superinfection of the chronically abnormal kidney. Recommend Urology consultation. 2. Small volume of pelvic free fluid also felt secondary to #1. 3. Calcified aortic atherosclerosis.   Electronically Signed By: Genevie Ann M.D. On: 09/25/2015 15:56  CT scan reviewed again today personally and with the patient.  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 In addition, he has fairly significant lower urinary tract symptoms related to BPH with incomplete bladder emptying  and a history of retention on maximal medical therapy.  1. Hydronephrosis with ureteropelvic junction (UPJ) obstruction Options moving forward were discussed today in detail. He does have chronic discomfort from his UPJ obstruction and there severe left renal atrophy with massive hydronephrosis and now complicated by infection.  As such, I would recommend left nephrectomy both for the purpose symptomatic relief as well as removing potential future infectious nidus. Alternative options including chronic indwelling ureteral stent versus UPJ repair were also discussed. Given the degree of atrophy, I do not feel that there is much benefit to reconstruction. I have offered him a Lasix renogram to confirm overall renal function which he declined.  We discussed simple left hand-assisted laparoscopic nephrectomy today in detail. Preoperative, intraoperative, and postoperative courses were discussed.  Risks including bleeding, infection, damage to surrounding structures, ileus, bowel obstruction, pain, hernia formation, amongst others were discussed in detail. All of his questions were answered. I recommended leaving his nephrostomy tube in place until the time of surgery. In addition, I would like him to continue antibiotics for an additional week given the severity of his infection.  Schedule left hand-assisted laparoscopic nephrectomy.  2. Left renal atrophy As above  3. Urinary retention History of urinary retention 2 on maximal medical therapy. He passed a voiding trial during his second admission prior to being seen today in clinic. - Bladder Scan (Post Void Residual) in office  4. Benign prostatic hypertrophy (BPH) with incomplete bladder emptying Elevated PVR today noted today along with severe urinary symptoms.  Continue Flomax/finasteride.  Rectal exam today shows enlarged gland, PSA deferred given recent infection and catheter. He will need a follow-up PSA in the near future.  I have recommended we address his renal issue prior to considering any possible outlet procedure.  We will discuss this further at a later date. All his questions were answered.   Hollice Espy, MD  [facility] [address], [place], [place] [address] [phone]  I spent 40 min with this patient of which greater than 50% was spent in counseling and coordination of care with the patient.

## 2015-11-07 NOTE — Transfer of Care (Signed)
Immediate Anesthesia Transfer of Care Note  Patient: Craig [name]  Procedure(s) Performed: Procedure(s): HAND ASSISTED LAPAROSCOPIC NEPHRECTOMY (Left)  Patient Location: PACU  Anesthesia Type:General  Level of Consciousness: awake and alert   Airway & Oxygen Therapy: Patient connected to face mask oxygen  Post-op Assessment: Post -op Vital signs reviewed and stable  Post vital signs: stable  Last Vitals:  Vitals:   11/07/15 1144 11/07/15 1737  BP: (!) 174/105 136/80  Pulse: 64 80  Resp: 16 12  Temp: 36.7 C     Last Pain:  Vitals:   11/07/15 1144  TempSrc: Oral  PainSc: 0-No pain         Complications: No apparent anesthesia complications

## 2015-11-08 ENCOUNTER — Encounter: Payer: Self-pay | Admitting: Urology

## 2015-11-08 LAB — BASIC METABOLIC PANEL
Anion gap: 4 — ABNORMAL LOW (ref 5–15)
BUN: 18 mg/dL (ref 6–20)
CHLORIDE: 107 mmol/L (ref 101–111)
CO2: 27 mmol/L (ref 22–32)
CREATININE: 1.09 mg/dL (ref 0.61–1.24)
Calcium: 8.2 mg/dL — ABNORMAL LOW (ref 8.9–10.3)
Glucose, Bld: 121 mg/dL — ABNORMAL HIGH (ref 65–99)
POTASSIUM: 4 mmol/L (ref 3.5–5.1)
SODIUM: 138 mmol/L (ref 135–145)

## 2015-11-08 LAB — CBC
HEMATOCRIT: 36.1 % — AB (ref 40.0–52.0)
HEMOGLOBIN: 12.5 g/dL — AB (ref 13.0–18.0)
MCH: 32 pg (ref 26.0–34.0)
MCHC: 34.6 g/dL (ref 32.0–36.0)
MCV: 92.6 fL (ref 80.0–100.0)
Platelets: 136 10*3/uL — ABNORMAL LOW (ref 150–440)
RBC: 3.9 MIL/uL — AB (ref 4.40–5.90)
RDW: 14.8 % — ABNORMAL HIGH (ref 11.5–14.5)
WBC: 7.3 10*3/uL (ref 3.8–10.6)

## 2015-11-08 MED ORDER — OXYCODONE-ACETAMINOPHEN 5-325 MG PO TABS: 1.0000 | ORAL_TABLET | ORAL | 0 refills | Status: DC | PRN

## 2015-11-08 MED ORDER — DOCUSATE SODIUM 100 MG PO CAPS
100.0000 mg | ORAL_CAPSULE | Freq: Two times a day (BID) | ORAL | 0 refills | Status: DC
Start: 2015-11-08 — End: 2016-04-14

## 2015-11-08 NOTE — Progress Notes (Signed)
1 Day Post-Op Subjective: The patient is doing well.  No nausea or vomiting. Pain is adequately controlled.  Objective: Vital signs in last 24 hours: Temp:  [97.6 F (36.4 C)-98.9 F (37.2 C)] 98.1 F (36.7 C) (09/20 0806) Pulse Rate:  [70-84] 73 (09/20 0806) Resp:  [10-20] 18 (09/20 0806) BP: (122-144)/(69-84) 140/79 (09/20 0806) SpO2:  [93 %-100 %] 98 % (09/20 0806)  Intake/Output from previous day: 09/19 0701 - 09/20 0700 In: 2130 [P.O.:30; I.V.:2100] Out: 1425 [Urine:1325; Blood:100] Intake/Output this shift: Total I/O In: 1788 [P.O.:240; I.V.:1548] Out: 600 [Urine:600]  Physical Exam:  General: Alert and oriented. CV: RRR Lungs: Clear bilaterally. GI: Soft, Nondistended. Incisions: Clean and dry. Urine: Clear, Foley in place Extremities: Nontender, no erythema, no edema.  Lab Results:  Recent Labs  11/07/15 1942 11/08/15 0544  HGB 13.3 12.5*  HCT 38.6* 36.1*          Recent Labs  11/08/15 0544  CREATININE 1.09           Results for orders placed or performed during the hospital encounter of 11/07/15 (from the past 24 hour(s))  CBC     Status: Abnormal   Collection Time: 11/07/15  7:42 PM  Result Value Ref Range   WBC 11.3 (H) 3.8 - 10.6 K/uL   RBC 4.22 (L) 4.40 - 5.90 MIL/uL   Hemoglobin 13.3 13.0 - 18.0 g/dL   HCT 38.6 (L) 40.0 - 52.0 %   MCV 91.5 80.0 - 100.0 fL   MCH 31.5 26.0 - 34.0 pg   MCHC 34.4 32.0 - 36.0 g/dL   RDW 14.6 (H) 11.5 - 14.5 %   Platelets 147 (L) 150 - 440 K/uL  CBC     Status: Abnormal   Collection Time: 11/08/15  5:44 AM  Result Value Ref Range   WBC 7.3 3.8 - 10.6 K/uL   RBC 3.90 (L) 4.40 - 5.90 MIL/uL   Hemoglobin 12.5 (L) 13.0 - 18.0 g/dL   HCT 36.1 (L) 40.0 - 52.0 %   MCV 92.6 80.0 - 100.0 fL   MCH 32.0 26.0 - 34.0 pg   MCHC 34.6 32.0 - 36.0 g/dL   RDW 14.8 (H) 11.5 - 14.5 %   Platelets 136 (L) 150 - 440 K/uL  Basic metabolic panel     Status: Abnormal   Collection Time: 11/08/15  5:44 AM  Result Value Ref  Range   Sodium 138 135 - 145 mmol/L   Potassium 4.0 3.5 - 5.1 mmol/L   Chloride 107 101 - 111 mmol/L   CO2 27 22 - 32 mmol/L   Glucose, Bld 121 (H) 65 - 99 mg/dL   BUN 18 6 - 20 mg/dL   Creatinine, Ser 1.09 0.61 - 1.24 mg/dL   Calcium 8.2 (L) 8.9 - 10.3 mg/dL   GFR calc non Af Amer >60 >60 mL/min   GFR calc Af Amer >60 >60 mL/min   Anion gap 4 (L) 5 - 15    Assessment/Plan: POD# 1 s/p laparoscopic hand assisted nephrectomy.  1) Ambulate, Incentive spirometry 2) Advance diet as tolerated 3) Transition to oral pain medication 4) D/C Foley 5) Likely d/c later today  Hollice Espy, MD   LOS: 1 day   Hollice Espy 11/08/2015, 12:25 PM

## 2015-11-08 NOTE — Anesthesia Postprocedure Evaluation (Signed)
Anesthesia Post Note  Patient: Craig [name]  Procedure(s) Performed: Procedure(s) (LRB): HAND ASSISTED LAPAROSCOPIC NEPHRECTOMY (Left)  Patient location during evaluation: PACU Anesthesia Type: General Level of consciousness: awake and alert and oriented Pain management: pain level controlled Vital Signs Assessment: post-procedure vital signs reviewed and stable Respiratory status: spontaneous breathing Cardiovascular status: blood pressure returned to baseline Anesthetic complications: no    Last Vitals:  Vitals:   11/08/15 0005 11/08/15 0437  BP: 138/74 132/69  Pulse: 84 79  Resp: 20 20  Temp: 37.2 C 36.8 C    Last Pain:  Vitals:   11/08/15 0437  TempSrc: Oral  PainSc:                  Monice Lundy

## 2015-11-08 NOTE — Discharge Summary (Signed)
Date of admission: 11/07/2015  Date of discharge: 11/08/2015  Admission diagnosis: left renal atrophy, left severe hydronephrosis, left UPJ obstruction  Discharge diagnosis: same, urinary retention  Secondary diagnoses:  Patient Active Problem List   Diagnosis Date Noted  . Hydronephrosis, left 11/07/2015  . Sepsis ([place]) 09/29/2015  . Acute pyelonephritis 09/25/2015  . Frequency of urination 05/29/2015  . BPH (benign prostatic hyperplasia) 09/05/2014  . Hyperlipidemia 09/05/2014  . Enlarged prostate with urinary obstruction 07/03/2014  . History of total knee replacement 06/02/2014  . Status post total right knee replacement 06/02/2014  . Osteoarthritis of knee 05/12/2014  . HTN (hypertension) 06/28/2013  . Hypertension 06/28/2013    History and Physical: For full details, please see admission history and physical. Briefly, Craig [name] is a 70 y.o. year old patient with severely hydronephrotic obstructed left kidney is atrophic. He underwent a left hand-assisted laparoscopic nephrectomy which was uncomplicated. He is admitted postop.Marland Kitchen   Hospital Course: Patient tolerated the procedure well.  He was then transferred to the floor after an uneventful PACU stay.  His hospital course was uncomplicated.  On POD#1 he had met discharge criteria: was eating a regular diet, was up and ambulating independently,  pain was well controlled, , and was ready to for discharge.  He does have a personal history of incomplete bladder emptying and urinary retention. He failed a voiding trial and was discharged home with Foley catheter in place. He will follow up Friday for voiding trial.   Laboratory values:   Recent Labs  11/07/15 1942 11/08/15 0544  WBC 11.3* 7.3  HGB 13.3 12.5*  HCT 38.6* 36.1*    Recent Labs  11/08/15 0544  NA 138  K 4.0  CL 107  CO2 27  GLUCOSE 121*  BUN 18  CREATININE 1.09  CALCIUM 8.2*   No results for input(s): LABPT, INR in the last 72 hours. No results  for input(s): LABURIN in the last 72 hours. Results for orders placed or performed during the hospital encounter of 10/31/15  Urine culture     Status: None   Collection Time: 10/31/15  9:04 AM  Result Value Ref Range Status   Specimen Description URINE, CLEAN CATCH  Final   Special Requests NONE  Final   Culture NO GROWTH Performed at [facility]   Final   Report Status 11/01/2015 FINAL  Final    Disposition: Home  Discharge instruction: The patient was instructed to be ambulatory but told to refrain from heavy lifting, strenuous activity, or driving.    Discharge medications:   Medication List    TAKE these medications   aspirin EC 81 MG tablet Take 81 mg by mouth daily.   cetirizine 10 MG tablet Commonly known as:  ZYRTEC Take 10 mg by mouth daily.   docusate sodium 100 MG capsule Commonly known as:  COLACE Take 100 mg by mouth 2 (two) times daily. What changed:  Another medication with the same name was added. Make sure you understand how and when to take each.   docusate sodium 100 MG capsule Commonly known as:  COLACE Take 1 capsule (100 mg total) by mouth 2 (two) times daily. What changed:  You were already taking a medication with the same name, and this prescription was added. Make sure you understand how and when to take each.   finasteride 5 MG tablet Commonly known as:  PROSCAR Take 5 mg by mouth daily.   fluticasone 50 MCG/ACT nasal spray Commonly known as:  FLONASE Place 2 sprays into both nostrils daily.   lisinopril 30 MG tablet Commonly known as:  PRINIVIL,ZESTRIL Take 30 mg by mouth daily.   omeprazole 40 MG capsule Commonly known as:  PRILOSEC Take 40 mg by mouth 2 (two) times daily.   oxyCODONE-acetaminophen 5-325 MG tablet Commonly known as:  PERCOCET/ROXICET Take 1-2 tablets by mouth every 4 (four) hours as needed for moderate pain.   simvastatin 40 MG tablet Commonly known as:  ZOCOR Take 40 mg by mouth daily.   tamsulosin  0.4 MG Caps capsule Commonly known as:  FLOMAX Take 0.4 mg by mouth daily.       Followup:  Follow-up Information    Hollice Espy, MD. Go on 12/06/2015.   Specialty:  Urology Why:  _0 :[place] information: [address] [place] [address] [phone]

## 2015-11-08 NOTE — Discharge Instructions (Signed)
·   Activity:  You are encouraged to ambulate frequently (about every hour during waking hours) to help prevent blood clots from forming in your legs or lungs.  However, you should not engage in any heavy lifting (> 5-10 lbs), strenuous activity, or straining. ° °· Diet: You should advance your diet as instructed by your physician.  It will be normal to have some bloating, nausea, and abdominal discomfort intermittently. ° °· Prescriptions:  You will be provided a prescription for pain medication to take as needed.  If your pain is not severe enough to require the prescription pain medication, you may take extra strength Tylenol instead which will have less side effects.  You should also take a prescribed stool softener to avoid straining with bowel movements as the prescription pain medication may constipate you. ° °· Incisions: You may remove your dressing bandages 48 hours after surgery if not removed in the hospital.  You will either have some small staples or special tissue glue at each of the incision sites. Once the bandages are removed (if present), the incisions may stay open to air.  You may start showering (but not soaking or bathing in water) the 2nd day after surgery and the incisions simply need to be patted dry after the shower.  No additional care is needed. ° °What to call us about: You should call the office if you develop fever > 101 or develop persistent vomiting, redness or draining around your incision, or any other concerning symptoms.   ° °[facility] °[address], [address] °[place], [place] [address] °[phone] ° ° °

## 2015-11-08 NOTE — Progress Notes (Signed)
Alert and oriented. Vital signs stable . No signs of acute distress. Discharge instructions given. Patient verbalized understanding. No other issues noted at this time. Foley cath placed upon discharge.

## 2015-11-10 ENCOUNTER — Ambulatory Visit (INDEPENDENT_AMBULATORY_CARE_PROVIDER_SITE_OTHER): Payer: Medicare Other

## 2015-11-10 VITALS — BP 148/89 | HR 80 | Ht 71.0 in | Wt 225.2 lb

## 2015-11-10 DIAGNOSIS — Z905 Acquired absence of kidney: Secondary | ICD-10-CM

## 2015-11-10 LAB — SURGICAL PATHOLOGY

## 2015-11-10 MED ORDER — OXYCODONE-ACETAMINOPHEN 5-325 MG PO TABS: 1.0000 | ORAL_TABLET | ORAL | 0 refills | Status: DC | PRN

## 2015-11-10 MED FILL — Thrombin For Soln 5000 Unit: CUTANEOUS | Qty: 5000 | Status: AC

## 2015-11-10 NOTE — Progress Notes (Signed)
Fill and Pull Catheter Removal  Patient is present today for a catheter removal.  Patient was cleaned and prepped in a sterile fashion 262ml of sterile water/ saline was instilled into the bladder when the patient felt the urge to urinate. 55ml of water was then drained from the balloon.  A 18FR coude foley cath was removed from the bladder no complications were noted .  Patient as then given some time to void on their own.  Patient can void  157ml on their own after some time.  Patient tolerated well.  Preformed by: Toniann Fail, LPN   Follow up/ Additional notes: pt requested more pain medication. Per Dr. Erlene Quan a script was given of 10 more tabs.  Blood pressure (!) 148/89, pulse 80, height 5\' 11"  (1.803 m), weight 225 lb 3.2 oz (102.2 kg).

## 2015-11-20 ENCOUNTER — Telehealth: Payer: Self-pay

## 2015-11-20 NOTE — Telephone Encounter (Signed)
Pt called stating he continues to have sharp stabbing like pains at incision site post nephrectomy. Pt stated that he has taken all the pain medication and has started on ibuprofen. Pt states ibuprofen does not seem to help with the pain. Please advise.

## 2015-11-21 NOTE — Telephone Encounter (Signed)
Any bulging or drainage at the site?  Try tylenol.  If still not better, should be evaluated in the office for hernia.  Ideally should not be using NSAIDS.    Hollice Espy, MD

## 2015-11-21 NOTE — Telephone Encounter (Signed)
Spoke with pt in reference to surgery sites. Pt stated that the sites are not bulging but he does get a raised area when the pain comes on. Pt stated he can not take tylenol as medication does not work for him. Pt was added to Dr. Cherrie Gauze schedule to be check out. Reinforced with pt narcotics will not be given at appt. Pt voiced understanding.

## 2015-11-22 ENCOUNTER — Ambulatory Visit (INDEPENDENT_AMBULATORY_CARE_PROVIDER_SITE_OTHER): Payer: Medicare Other | Admitting: Urology

## 2015-11-22 VITALS — BP 146/85 | HR 82 | Ht 71.0 in | Wt 221.0 lb

## 2015-11-22 DIAGNOSIS — R208 Other disturbances of skin sensation: Secondary | ICD-10-CM

## 2015-11-22 DIAGNOSIS — L7682 Other postprocedural complications of skin and subcutaneous tissue: Secondary | ICD-10-CM

## 2015-11-22 DIAGNOSIS — Z905 Acquired absence of kidney: Secondary | ICD-10-CM

## 2015-11-22 DIAGNOSIS — N261 Atrophy of kidney (terminal): Secondary | ICD-10-CM

## 2015-11-22 NOTE — Progress Notes (Signed)
11/22/2015 6:53 PM   Craig [name] 18-Jan-1946 [id number]  Referring provider: Tracie Harrier, MD [address] [facility] [place], [place] [address]  Chief Complaint  Patient presents with  . Wound Check    HPI: Excision-year-old male status post recent left hand assist laparoscopic nephrectomy for an atrophic, extremely hydronephrotic kidney on 11/07/2015.   Overall, he is doing fairly well but has been having fairly significant amount of incisional pain.  Comes in today for further evaluation of this. He no longer has any prescribed narcotics but has been taking Tylenol and occasionally some leftover tramadol which helps.  No redness or drainage from the incisions. 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 He has been avoiding heavy lifting.  His urinary symptoms are slowly improving since his catheter has been removed.   PMH: Past Medical History:  Diagnosis Date  . BPH (benign prostatic hyperplasia)   . Cancer ([place])    skin cancer   . GERD (gastroesophageal reflux disease)   . History of hiatal hernia   . Hypercholesterolemia   . Hypertension     Surgical History: Past Surgical History:  Procedure Laterality Date  . IR GENERIC HISTORICAL  09/29/2015   IR NEPHROSTOMY PLACEMENT LEFT 09/29/2015 [name], MD [facility]INTERV RAD  . IR GENERIC HISTORICAL  10/20/2015   IR NEPHROSTOGRAM LEFT THRU EXISTING ACCESS 10/20/2015 [facility]INTER[facility]  . JOINT REPLACEMENT    . LAPAROSCOPIC NEPHRECTOMY, HAND ASSISTED Left 11/07/2015   Procedure: HAND ASSISTED LAPAROSCOPIC NEPHRECTOMY;  Surgeon: Hollice Espy, MD;  Location: [facility] ORS;  Service: Urology;  Laterality: Left;  . REPLACEMENT TOTAL KNEE Right   . TONSILLECTOMY      Home Medications:    Medication List       Accurate as of 11/22/15  6:53 PM. Always use your most recent med list.          aspirin EC 81 MG tablet Take 81 mg by mouth daily.   cetirizine 10 MG tablet Commonly known as:   ZYRTEC Take 10 mg by mouth daily.   docusate sodium 100 MG capsule Commonly known as:  COLACE Take 100 mg by mouth 2 (two) times daily.   docusate sodium 100 MG capsule Commonly known as:  COLACE Take 1 capsule (100 mg total) by mouth 2 (two) times daily.   finasteride 5 MG tablet Commonly known as:  PROSCAR Take 5 mg by mouth daily.   fluticasone 50 MCG/ACT nasal spray Commonly known as:  FLONASE Place 2 sprays into both nostrils daily.   lisinopril 30 MG tablet Commonly known as:  PRINIVIL,ZESTRIL Take 30 mg by mouth daily.   omeprazole 40 MG capsule Commonly known as:  PRILOSEC Take 40 mg by mouth 2 (two) times daily.   simvastatin 40 MG tablet Commonly known as:  ZOCOR Take 40 mg by mouth daily.   tamsulosin 0.4 MG Caps capsule Commonly known as:  FLOMAX Take 0.4 mg by mouth daily.   traMADol 50 MG tablet Commonly known as:  ULTRAM       Allergies:  Allergies  Allergen Reactions  . Peanut Oil Other (See Comments)    Mouth ulcers Mouth ulcers    Family History: Family History  Problem Relation Age of Onset  . Kidney cancer Mother   . Lung cancer Father   . Prostate cancer Neg Hx   . Bladder Cancer Neg Hx     Social History:  reports that he quit smoking about 32 years ago.  He has a 30.00 pack-year smoking history. He has never used smokeless tobacco. He reports that he does not drink alcohol or use drugs.  ROS: UROLOGY Frequent Urination?: Yes Hard to postpone urination?: No Burning/pain with urination?: No Get up at night to urinate?: No Leakage of urine?: No Urine stream starts and stops?: Yes Trouble starting stream?: No Do you have to strain to urinate?: No Blood in urine?: No Urinary tract infection?: No Sexually transmitted disease?: No Injury to kidneys or bladder?: No Painful intercourse?: No Weak stream?: No Erection problems?: No Penile pain?: No  Gastrointestinal Nausea?: No Vomiting?: No Indigestion/heartburn?:  No Diarrhea?: No Constipation?: No  Constitutional Fever: No Night sweats?: No Weight loss?: No Fatigue?: No  Skin Skin rash/lesions?: No Itching?: No  Eyes Blurred vision?: No Double vision?: No  Ears/Nose/Throat Sore throat?: No Sinus problems?: No  Hematologic/Lymphatic Swollen glands?: No Easy bruising?: No  Cardiovascular Leg swelling?: No Chest pain?: No  Respiratory Cough?: No Shortness of breath?: No  Endocrine Excessive thirst?: No  Musculoskeletal Back pain?: No Joint pain?: No  Neurological Headaches?: No Dizziness?: No  Psychologic Depression?: No Anxiety?: No  Physical Exam: BP (!) 146/85   Pulse 82   Ht 5\' 11"  (1.803 m)   Wt 221 lb (100.2 kg)   BMI 30.82 kg/m   Constitutional:  Alert and oriented, No acute distress. HEENT: [place] AT, moist mucus membranes.  Trachea midline, no masses. Cardiovascular: No clubbing, cyanosis, or edema. Respiratory: Normal respiratory effort, no increased work of breathing. GI: Abdomen is soft, nontender, nondistended, no abdominal masses.  All incisions including 2 laparoscopic port sites and hand assist port site all healing well. No evidence of incisional hernia or infection. GU: No CVA tenderness. Left PCN side well healed.   Skin: No rashes, bruises or suspicious lesions. Neurologic: Grossly intact, no focal deficits, moving all 4 extremities. Psychiatric: Normal mood and affect.  Laboratory Data: Lab Results  Component Value Date   WBC 7.3 11/08/2015   HGB 12.5 (L) 11/08/2015   HCT 36.1 (L) 11/08/2015   MCV 92.6 11/08/2015   PLT 136 (L) 11/08/2015    Lab Results  Component Value Date   CREATININE 1.09 11/08/2015     Assessment & Plan:    1. Left renal atrophy S/p nephrectomy  2. S/p nephrectomy Pathology benign  3. Incisional pain No evidence of hernia or infection Recommended continued supportive care Lifting precations RTC as previously schedule   Hollice Espy,  MD  [facility] [address], [place], [place] [address] [phone]

## 2015-11-24 ENCOUNTER — Ambulatory Visit: Payer: Medicare Other | Admitting: Urology

## 2015-12-06 ENCOUNTER — Encounter: Payer: Self-pay | Admitting: Urology

## 2015-12-06 ENCOUNTER — Ambulatory Visit (INDEPENDENT_AMBULATORY_CARE_PROVIDER_SITE_OTHER): Payer: Medicare Other | Admitting: Urology

## 2015-12-06 VITALS — BP 157/94 | HR 61 | Ht 71.0 in | Wt 226.8 lb

## 2015-12-06 DIAGNOSIS — Z87898 Personal history of other specified conditions: Secondary | ICD-10-CM

## 2015-12-06 DIAGNOSIS — N401 Enlarged prostate with lower urinary tract symptoms: Secondary | ICD-10-CM

## 2015-12-06 DIAGNOSIS — N261 Atrophy of kidney (terminal): Secondary | ICD-10-CM | POA: Diagnosis not present

## 2015-12-06 DIAGNOSIS — R3914 Feeling of incomplete bladder emptying: Secondary | ICD-10-CM

## 2015-12-06 NOTE — Progress Notes (Signed)
12/06/2015 1:25 PM   Craig [name] 1945/05/07 [id number]  Referring provider: Tracie Harrier, MD [address] [facility] [place], [place] [address]  Chief Complaint  Patient presents with  . Follow-up    left renal atrophy     HPI: 70 year old male with chronic left UPJ obstruction and massively dilated hydronephrotic atrophic kidney status post left hand-assisted laparoscopic nephrectomy on 11/06/2015. Surgery was uncomplicated. 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 No malignancy was identified.  He is healed well from surgery. He no longer has severe incisional pain. He is anxious to get back to full activity. BMP was drawn today.  He also has a history of baseline severe urinary symptoms, and urinary retention. He has severe daytime urgency and nocturia 3. He denies any incontinence. His urinary symptoms did improve somewhat well and antibiotics but have recurred. He's had elevated postvoid residuals in the past.  He's been on Flomax and finasteride chronically and continues to have severe urinary symptoms.  No recent PSA in light of recent urinary tract infection and Foley catheterizations.    PMH: Past Medical History:  Diagnosis Date  . BPH (benign prostatic hyperplasia)   . Cancer ([place])    skin cancer   . GERD (gastroesophageal reflux disease)   . History of hiatal hernia   . Hypercholesterolemia   . Hypertension     Surgical History: Past Surgical History:  Procedure Laterality Date  . IR GENERIC HISTORICAL  09/29/2015   IR NEPHROSTOMY PLACEMENT LEFT 09/29/2015 [name], MD [facility]  . IR GENERIC HISTORICAL  10/20/2015   IR NEPHROSTOGRAM LEFT THRU EXISTING ACCESS 10/20/2015 [facility]  . JOINT REPLACEMENT    . LAPAROSCOPIC NEPHRECTOMY, HAND ASSISTED Left 11/07/2015   Procedure: HAND ASSISTED LAPAROSCOPIC NEPHRECTOMY;  Surgeon: Hollice Espy, MD;  Location: [facility] ORS;  Service: Urology;  Laterality: Left;  . REPLACEMENT TOTAL KNEE Right   . TONSILLECTOMY      Home Medications:    Medication List       Accurate as of 12/06/15  1:25 PM. Always use your most recent med list.          aspirin EC 81 MG tablet Take 81 mg by mouth daily.   cetirizine 10 MG tablet Commonly known as:  ZYRTEC Take 10 mg by mouth daily.   docusate sodium 100 MG capsule Commonly known as:  COLACE Take 100 mg by mouth 2 (two) times daily.   docusate sodium 100 MG capsule Commonly known as:  COLACE Take 1 capsule (100 mg total) by mouth 2 (two) times daily.   finasteride 5 MG tablet Commonly known as:  PROSCAR Take 5 mg by mouth daily.   fluticasone 50 MCG/ACT nasal spray Commonly known as:  FLONASE Place 2 sprays into both nostrils daily.   lisinopril 30 MG tablet Commonly known as:  PRINIVIL,ZESTRIL Take 30 mg by mouth daily.   omeprazole 40 MG capsule Commonly known as:  PRILOSEC Take 40 mg by mouth 2 (two) times daily.   simvastatin 40 MG tablet Commonly known as:  ZOCOR Take 40 mg by mouth daily.   tamsulosin 0.4 MG Caps capsule Commonly known as:  FLOMAX Take 0.4 mg by mouth daily.   traMADol 50 MG tablet Commonly known as:  ULTRAM       Allergies:  Allergies  Allergen Reactions  . Peanut Oil Other (See Comments)    Mouth ulcers Mouth ulcers  Family History: Family History  Problem Relation Age of Onset  . Kidney cancer Mother   . Lung cancer Father   . Prostate cancer Neg Hx   . Bladder Cancer Neg Hx     Social History:  reports that he quit smoking about 32 years ago. He has a 30.00 pack-year smoking history. He has never used smokeless tobacco. He reports that he does not drink alcohol or use drugs.  ROS: UROLOGY Frequent Urination?: Yes Hard to postpone urination?: No Burning/pain with urination?: No Get up at night to urinate?: No Leakage of urine?: No Urine stream starts and  stops?: No Trouble starting stream?: No Do you have to strain to urinate?: No Blood in urine?: No Urinary tract infection?: No Sexually transmitted disease?: No Injury to kidneys or bladder?: No Painful intercourse?: No Weak stream?: No Erection problems?: No Penile pain?: No  Gastrointestinal Nausea?: No Vomiting?: No Indigestion/heartburn?: No Diarrhea?: No Constipation?: No  Constitutional Fever: No Night sweats?: No Weight loss?: No Fatigue?: No  Skin Skin rash/lesions?: No Itching?: No  Eyes Blurred vision?: No Double vision?: No  Ears/Nose/Throat Sore throat?: No Sinus problems?: No  Hematologic/Lymphatic Swollen glands?: No Easy bruising?: No  Cardiovascular Leg swelling?: No Chest pain?: No  Respiratory Cough?: No Shortness of breath?: No  Endocrine Excessive thirst?: No  Musculoskeletal Back pain?: No Joint pain?: No  Neurological Headaches?: No Dizziness?: No  Psychologic Depression?: No Anxiety?: No  Physical Exam: BP (!) 157/94   Pulse 61   Ht 5\' 11"  (1.803 m)   Wt 226 lb 12.8 oz (102.9 kg)   BMI 31.63 kg/m   Constitutional:  Alert and oriented, No acute distress. HEENT: [place] AT, moist mucus membranes.  Trachea midline, no masses. Cardiovascular: No clubbing, cyanosis, or edema. Respiratory: Normal respiratory effort, no increased work of breathing. GI: Abdomen is soft, nontender, nondistended, no abdominal masses. Abdominal incisions healing well. Rectal: Enlarged 50 cc prostate, diffusely firm without nodules, nontender. Skin: No rashes, bruises or suspicious lesions. Lymph: No cervical or inguinal adenopathy. Neurologic: Grossly intact, no focal deficits, moving all 4 extremities. Psychiatric: Normal mood and affect.  Laboratory Data: Lab Results  Component Value Date   WBC 7.3 11/08/2015   HGB 12.5 (L) 11/08/2015   HCT 36.1 (L) 11/08/2015   MCV 92.6 11/08/2015   PLT 136 (L) 11/08/2015    Lab Results  Component  Value Date   CREATININE 1.09 11/08/2015   Urinalysis    Component Value Date/Time   COLORURINE YELLOW (A) 10/31/2015 0904   APPEARANCEUR CLEAR (A) 10/31/2015 0904   LABSPEC 1.010 10/31/2015 0904   PHURINE 7.0 10/31/2015 0904   GLUCOSEU NEGATIVE 10/31/2015 0904   HGBUR NEGATIVE 10/31/2015 0904   BILIRUBINUR NEGATIVE 10/31/2015 0904   KETONESUR NEGATIVE 10/31/2015 0904   PROTEINUR NEGATIVE 10/31/2015 0904   NITRITE NEGATIVE 10/31/2015 0904   LEUKOCYTESUR NEGATIVE 10/31/2015 0904    Pertinent Imaging: n/a  Assessment & Plan:    1. Left renal atrophy S/p left nephrectomy Pathology reviewed, cleared to return to work - Basic metabolic panel  2. Benign prostatic hyperplasia with incomplete bladder emptying  Previously discussed addressing severe voiding issues following left nephrectomy Currently on maximal medical therapy with continued incomplete bladder emptying, urgency, frequency Personal history of urinary retention I suspect he will need an outlet procedure, modalities were briefly discussed today I have recommended further workup with cystoscopy to evaluate the size and shape of his prostate in order to recommend an optimal outlet procedure PSA/ DRE today  Continue flowmax/ finasteride - PSA  3. History of urinary retention As above   Return in about 2 weeks (around 12/20/2015) for cysto.  Hollice Espy, MD  [facility] [address], [place], [place] [address] [phone]

## 2015-12-07 ENCOUNTER — Telehealth: Payer: Self-pay

## 2015-12-07 LAB — BASIC METABOLIC PANEL
BUN / CREAT RATIO: 15 (ref 10–24)
BUN: 19 mg/dL (ref 8–27)
CALCIUM: 9.3 mg/dL (ref 8.6–10.2)
CO2: 26 mmol/L (ref 18–29)
Chloride: 99 mmol/L (ref 96–106)
Creatinine, Ser: 1.27 mg/dL (ref 0.76–1.27)
GFR, EST AFRICAN AMERICAN: 66 mL/min/{1.73_m2} (ref >59)
GFR, EST NON AFRICAN AMERICAN: 57 mL/min/{1.73_m2} — AB (ref >59)
Glucose: 114 mg/dL — ABNORMAL HIGH (ref 65–99)
POTASSIUM: 4.6 mmol/L (ref 3.5–5.2)
Sodium: 142 mmol/L (ref 134–144)

## 2015-12-07 LAB — PSA: Prostate Specific Ag, Serum: 0.8 ng/mL (ref 0.0–4.0)

## 2015-12-07 NOTE — Telephone Encounter (Signed)
-----   Message from Hollice Espy, MD sent at 12/07/2015  7:52 AM EDT ----- PSA is excellent! Hollice Espy, MD

## 2015-12-07 NOTE — Telephone Encounter (Signed)
Spoke with pt wife in reference to psa results. Wife voiced understanding.

## 2016-01-03 ENCOUNTER — Ambulatory Visit (INDEPENDENT_AMBULATORY_CARE_PROVIDER_SITE_OTHER): Payer: Medicare Other | Admitting: Urology

## 2016-01-03 VITALS — BP 173/114 | HR 65 | Ht 71.0 in | Wt 231.0 lb

## 2016-01-03 DIAGNOSIS — N401 Enlarged prostate with lower urinary tract symptoms: Secondary | ICD-10-CM | POA: Diagnosis not present

## 2016-01-03 DIAGNOSIS — N138 Other obstructive and reflux uropathy: Secondary | ICD-10-CM | POA: Diagnosis not present

## 2016-01-03 LAB — URINALYSIS, COMPLETE
BILIRUBIN UA: NEGATIVE
Glucose, UA: NEGATIVE
Ketones, UA: NEGATIVE
LEUKOCYTES UA: NEGATIVE
Nitrite, UA: NEGATIVE
PH UA: 6.5 (ref 5.0–7.5)
Protein, UA: NEGATIVE
RBC UA: NEGATIVE
Specific Gravity, UA: 1.025 (ref 1.005–1.030)
UUROB: 0.2 mg/dL (ref 0.2–1.0)

## 2016-01-03 LAB — MICROSCOPIC EXAMINATION: BACTERIA UA: NONE SEEN

## 2016-01-03 MED ORDER — LIDOCAINE HCL 2 % EX GEL
1.0000 "application " | Freq: Once | CUTANEOUS | Status: AC
  Administered 2016-01-03: 1 via URETHRAL

## 2016-01-03 MED ORDER — CIPROFLOXACIN HCL 500 MG PO TABS
500.0000 mg | ORAL_TABLET | Freq: Once | ORAL | Status: AC
  Administered 2016-01-03: 500 mg via ORAL

## 2016-01-03 NOTE — Progress Notes (Signed)
   01/03/16  CC:  Chief Complaint  Patient presents with  . Cysto    HPI: 70 year old male with chronic left UPJ obstruction status post recent left hand-assisted laparoscopic nephrectomy on 10/2015 as well as history of significant urinary symptoms, both obstructive and irritative with a history of retention. He presents the office today for cystoscopy to evaluate his prostatic anatomy in anticipation for possible outlet procedure.  Blood pressure (!) 173/114, pulse 65, height 5\' 11"  (1.803 m), weight 231 lb (104.8 kg). NED. A&Ox3.   No respiratory distress   Abd soft, NT, ND Normal phallus with bilateral descended testicles  Cystoscopy Procedure Note  Patient identification was confirmed, informed consent was obtained, and patient was prepped using Betadine solution.  Lidocaine jelly was administered per urethral meatus.    Preoperative abx where received prior to procedure.     Pre-Procedure: - Inspection reveals a normal caliber ureteral meatus.  Procedure: The flexible cystoscope was introduced without difficulty - No urethral strictures/lesions are present. - Enlarged prostate trilobar coaptation, 5 cm length (kissing lateral lobes) - Elevated bladder neck (mildy) - Bilateral ureteral orifices identified, somewhat laterally displaced - Bladder mucosa  reveals no ulcers, tumors, or lesions - No bladder stones - Mild trabeculation with wide mouthed left lateral diverticulum  Retroflexion shows mild intravesical protrusion of median lobe   Post-Procedure: - Patient tolerated the procedure well  Assessment/ Plan:  1. Left renal atrophy Secondary to chronic UPJ obstruction S/p left nephrectomy 9/17  2. Benign prostatic hyperplasia with incomplete bladder emptying Cystoscopy today with bilobar coaptation and intravesical median lobe component Estimated calculated prostate volume 65 cc based on most recent cross-sectional imaging, CT abdomen pelvis Currently on  maximal medical therapy with continued incomplete bladder emptying, urgency, frequency Personal history of urinary retention Continue flowmax/ finasteride Most recent PSA 0.8, rectal exam 50 cc, diffusely firm without nodules on 11/2015 I do feel the patient would benefit from an outlet procedure- he would like to discuss this further separate appointment where his wife could be present, will arrange for this  3. History of urinary retention As above  Hollice Espy, MD

## 2016-01-19 ENCOUNTER — Ambulatory Visit (INDEPENDENT_AMBULATORY_CARE_PROVIDER_SITE_OTHER): Payer: Medicare Other | Admitting: Urology

## 2016-01-19 ENCOUNTER — Encounter: Payer: Self-pay | Admitting: Urology

## 2016-01-19 ENCOUNTER — Other Ambulatory Visit
Admission: RE | Admit: 2016-01-19 | Discharge: 2016-01-19 | Disposition: A | Discharge status: Home / Self Care | Payer: Medicare Other | Source: Ambulatory Visit | Attending: Urology | Admitting: Urology

## 2016-01-19 VITALS — BP 145/80 | HR 60 | Ht 71.0 in | Wt 231.0 lb

## 2016-01-19 DIAGNOSIS — Z87898 Personal history of other specified conditions: Secondary | ICD-10-CM

## 2016-01-19 DIAGNOSIS — N401 Enlarged prostate with lower urinary tract symptoms: Secondary | ICD-10-CM | POA: Diagnosis not present

## 2016-01-19 DIAGNOSIS — N261 Atrophy of kidney (terminal): Secondary | ICD-10-CM | POA: Diagnosis not present

## 2016-01-19 DIAGNOSIS — N138 Other obstructive and reflux uropathy: Secondary | ICD-10-CM

## 2016-01-19 NOTE — Progress Notes (Signed)
01/19/2016 1:29 PM   Craig [name] 02-23-1945 [id number]  Referring provider: Tracie Harrier, MD [address] [facility] [place], [place] [address]  Chief Complaint  Patient presents with  . 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    He also has a history of baseline severe urinary symptoms, and urinary retention. He has severe daytime urgency and nocturia 3. He denies any incontinence. His urinary symptoms did improve somewhat well and antibiotics but have recurred. He's had elevated postvoid residuals in the past.  He's been on Flomax and finasteride chronically and continues to have severe urinary symptoms.  Cystoscopy 01/03/16 with bilobar coaptation and intravesical median lobe component, estimated calculated prostate volume 65 cc based on most recent cross-sectional imaging, CT abdomen pelvis Currently on maximal medical therapy with continued incomplete bladder emptying, urgency, frequency Personal history of urinary retention Currently on maximal medical therapy in the form of flowmax/ finasteride Most recent PSA 0.8, rectal exam 50 cc, diffusely firm without nodules on 11/2015 I do feel the patient would benefit from an outlet procedure- he would like to discuss this further separate appointment where his wife could be present, will arrange for this  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 Surgery was uncomplicated. 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 No malignancy was identified.   PMH: Past Medical History:  Diagnosis Date  . BPH (benign prostatic hyperplasia)   . Cancer ([place])    skin cancer   .  GERD (gastroesophageal reflux disease)   . History of hiatal hernia   . Hypercholesterolemia   . Hypertension     Surgical History: Past Surgical History:  Procedure Laterality Date  . IR GENERIC HISTORICAL  09/29/2015   IR NEPHROSTOMY PLACEMENT LEFT 09/29/2015 [name], MD [facility]  . IR GENERIC HISTORICAL  10/20/2015   IR NEPHROSTOGRAM LEFT THRU EXISTING ACCESS 10/20/2015 [facility]  . JOINT REPLACEMENT    . LAPAROSCOPIC NEPHRECTOMY, HAND ASSISTED Left 11/07/2015   Procedure: HAND ASSISTED LAPAROSCOPIC NEPHRECTOMY;  Surgeon: Hollice Espy, MD;  Location: [facility] ORS;  Service: Urology;  Laterality: Left;  . REPLACEMENT TOTAL KNEE Right   . TONSILLECTOMY      Home Medications:    Medication List       Accurate as of 01/19/16  1:29 PM. Always use your most recent med list.          aspirin EC 81 MG tablet Take 81 mg by mouth daily.   cetirizine 10 MG tablet Commonly known as:  ZYRTEC Take 10 mg by mouth daily.   docusate sodium 100 MG capsule Commonly known as:  COLACE Take 100 mg by mouth 2 (two) times daily.   docusate sodium 100 MG capsule Commonly known as:  COLACE Take 1 capsule (100 mg total) by mouth 2 (two) times daily.   finasteride 5 MG tablet Commonly known as:  PROSCAR Take 5 mg by mouth daily.   fluticasone 50 MCG/ACT nasal spray Commonly known as:  FLONASE Place 2 sprays into both nostrils daily.   lisinopril 30 MG tablet Commonly known as:  PRINIVIL,ZESTRIL Take 30 mg by mouth daily.   omeprazole 40 MG capsule Commonly known as:  PRILOSEC Take 40 mg by mouth 2 (two)  times daily.   simvastatin 40 MG tablet Commonly known as:  ZOCOR Take 40 mg by mouth daily.   tamsulosin 0.4 MG Caps capsule Commonly known as:  FLOMAX Take 0.4 mg by mouth daily.   traMADol 50 MG tablet Commonly known as:  ULTRAM       Allergies:  Allergies  Allergen Reactions  . Peanut Oil Other (See Comments)    Mouth ulcers Mouth ulcers    Family  History: Family History  Problem Relation Age of Onset  . Kidney cancer Mother   . Lung cancer Father   . Prostate cancer Neg Hx   . Bladder Cancer Neg Hx     Social History:  reports that he quit smoking about 32 years ago. He has a 30.00 pack-year smoking history. He has never used smokeless tobacco. He reports that he does not drink alcohol or use drugs.  ROS: UROLOGY Frequent Urination?: Yes Hard to postpone urination?: No Burning/pain with urination?: No Get up at night to urinate?: Yes Leakage of urine?: No Urine stream starts and stops?: No Trouble starting stream?: No Do you have to strain to urinate?: No Blood in urine?: No Urinary tract infection?: No Sexually transmitted disease?: No Injury to kidneys or bladder?: No Painful intercourse?: No Weak stream?: No Erection problems?: No Penile pain?: No  Gastrointestinal Nausea?: No Vomiting?: No Indigestion/heartburn?: No Diarrhea?: No Constipation?: No  Constitutional Fever: No Night sweats?: No Weight loss?: No Fatigue?: No  Skin Skin rash/lesions?: No Itching?: No  Eyes Blurred vision?: No Double vision?: No  Ears/Nose/Throat Sore throat?: No Sinus problems?: No  Hematologic/Lymphatic Swollen glands?: No Easy bruising?: No  Cardiovascular Leg swelling?: No Chest pain?: No  Respiratory Cough?: No Shortness of breath?: No  Endocrine Excessive thirst?: No  Musculoskeletal Back pain?: No Joint pain?: No  Neurological Headaches?: No Dizziness?: No  Psychologic Depression?: No Anxiety?: No  Physical Exam: BP (!) 145/80 (BP Location: Right Arm, Patient Position: Sitting, Cuff Size: Large)   Pulse 60   Ht 5\' 11"  (1.803 m)   Wt 231 lb (104.8 kg)   BMI 32.22 kg/m   Constitutional:  Alert and oriented, No acute distress.  Accompanied by wife today. HEENT: [place] AT, moist mucus membranes.  Trachea midline, no masses. Cardiovascular: No clubbing, cyanosis, or edema.  CTAB. Respiratory: Normal respiratory effort, no increased work of breathing. RRR. GI: Abdomen is soft, nontender, nondistended, no abdominal masses GU: No CVA tenderness. Skin: No rashes, bruises or suspicious lesions. Neurologic: Grossly intact, no focal deficits, moving all 4 extremities. Psychiatric: Normal mood and affect.  Laboratory Data: Lab Results  Component Value Date   WBC 7.3 11/08/2015   HGB 12.5 (L) 11/08/2015   HCT 36.1 (L) 11/08/2015   MCV 92.6 11/08/2015   PLT 136 (L) 11/08/2015    Lab Results  Component Value Date   CREATININE 1.27 12/06/2015    Assessment & Plan:    1. BPH with obstruction/lower urinary tract symptoms Role of outlet procedure was discussed today in detail. He does have both irritative and obstructive urinary symptoms. We discussed that an outlet procedure to help him with his outlet symptoms including decreased stream, history of retention, and incomplete bladder emptying and may or may not help with his urgency frequency symptoms.  Various surgical techniques were discussed including traditional TURP, laser ablation of the prostate, holmium laser enucleation of the prostate, and other new or procedures urolift.  Risk and benefits of each were reviewed in detail.  He is most  interested in laser ablation of the prostate. This can be done as an outpatient and will require Foley catheter for one to 3 days postop. Given his history of postoperative retention, I would like to keep his catheter for slightly longer to avoid issues with postoperative retention.  Specifically, risk of bleeding, infection, damage to surrounding structures, bladder neck or urethral strictures/contracture, retrograde ejaculation, worsening of irritative voiding symptoms, failure of the procedure were discussed.  He will stop his aspirin one week prior to surgery.  Preoperative urine culture will be obtained.  All of his questions were answered.  - Urine culture;  Future  2. History of urinary retention As above  3. Left renal atrophy Secondary to chronic UPJ obstruction S/p left nephrectomy 9/17  Schedule surgery as above  Hollice Espy, MD  [facility] [address] [place], [place] [address] [phone]  I spent 25 min with this patient of which greater than 50% was spent in counseling and coordination of care with the patient.

## 2016-01-21 LAB — URINE CULTURE

## 2016-01-25 ENCOUNTER — Other Ambulatory Visit: Payer: Self-pay | Admitting: Radiology

## 2016-01-25 DIAGNOSIS — N138 Other obstructive and reflux uropathy: Secondary | ICD-10-CM

## 2016-01-25 DIAGNOSIS — N401 Enlarged prostate with lower urinary tract symptoms: Principal | ICD-10-CM

## 2016-01-30 ENCOUNTER — Telehealth: Payer: Self-pay | Admitting: Radiology

## 2016-01-30 NOTE — Telephone Encounter (Signed)
-----   Message from Hollice Espy, MD sent at 01/19/2016  2:07 PM EST ----- Regarding: schedule surgery Please schedule laser ablation of the prostate using the Thulium laser.  He would like to have surgery in early January.  Preop urine culture was obtained today and I don't think he needs any other preoperative labs. He recently had surgery so hopefully he can just be a phone interview.  Outpatient surgery. Preop Ancef 2 g.  Hollice Espy, MD

## 2016-01-30 NOTE — Telephone Encounter (Signed)
notfied pt of surgery scheduled with Dr Erlene Quan on 03/06/16, pre-admit testing phone interview on 02/26/16 between 9am-1pm & to call day prior to surgery for arrival time to [facility]. Advised pt to hold ASA 81mg  beginning 02/19/16 per Dr Erlene Quan.  Pt voices understanding.

## 2016-02-19 HISTORY — PX: CORONARY ARTERY BYPASS GRAFT: SHX141

## 2016-02-26 ENCOUNTER — Encounter
Admission: RE | Admit: 2016-02-26 | Discharge: 2016-02-26 | Disposition: A | Discharge status: Home / Self Care | Payer: Medicare Other | Source: Ambulatory Visit | Attending: Urology | Admitting: Urology

## 2016-02-26 NOTE — Patient Instructions (Signed)
  Your procedure is scheduled on: 03-06-16 Report to [facility] 2nd floor medical mall [facility] Entrance-take elevator on left to 2nd floor.  Check in with surgery information desk.) To find out your arrival time please call [phone] between 1PM - 3PM on 03-05-16  Remember: Instructions that are not followed completely may result in serious medical risk, up to and including death, or upon the discretion of your surgeon and anesthesiologist your surgery may need to be rescheduled.    _x___ 1. Do not eat food or drink liquids after midnight. No gum chewing or hard candies.     __x__ 2. No Alcohol for 24 hours before or after surgery.   __x__3. No Smoking for 24 prior to surgery.   ____  4. Bring all medications with you on the day of surgery if instructed.    __x__ 5. Notify your doctor if there is any change in your medical condition     (cold, fever, infections).     Do not wear jewelry, make-up, hairpins, clips or nail polish.  Do not wear lotions, powders, or perfumes. You may wear deodorant.  Do not shave 48 hours prior to surgery. Men may shave face and neck.  Do not bring valuables to the hospital.    [facility] is not responsible for any belongings or valuables.               Contacts, dentures or bridgework may not be worn into surgery.  Leave your suitcase in the car. After surgery it may be brought to your room.  For patients admitted to the hospital, discharge time is determined by your treatment team.   Patients discharged the day of surgery will not be allowed to drive home.  You will need someone to drive you home and stay with you the night of your procedure.    Please read over the following fact sheets that you were given:   [facility] Preparing for Surgery and or MRSA Information   _x___ Take these medicines the morning of surgery with A SIP OF WATER:    1. PRILOSEC  2. LISINOPRIL  3. FINASTERIDE  4.  5.  6.  ____Fleets enema or Magnesium  Citrate as directed.   ____ Use CHG Soap or sage wipes as directed on instruction sheet   ____ Use inhalers on the day of surgery and bring to hospital day of surgery  ____ Stop metformin 2 days prior to surgery    ____ Take 1/2 of usual insulin dose the night before surgery and none on the morning of  surgery.   _X___ Stop Aspirin, Coumadin, Pllavix ,Eliquis, Effient, or Pradaxa-PT TO STOP ASPIRIN 1 WEEK PRIOR TO SURGERY PER SURGEON  x__ Stop Anti-inflammatories such as Advil, Aleve, Ibuprofen, Motrin, Naproxen,          Naprosyn, Goodies powders or aspirin products NOW-Ok to take Tylenol.   ____ Stop supplements until after surgery.    ____ Bring C-Pap to the hospital.

## 2016-03-06 MED ORDER — CEFAZOLIN SODIUM-DEXTROSE 2-4 GM/100ML-% IV SOLN: 2.0000 g | INTRAVENOUS | Status: DC

## 2016-03-06 MED ORDER — CEFAZOLIN SODIUM-DEXTROSE 2-4 GM/100ML-% IV SOLN
INTRAVENOUS | Status: AC
  Filled 2016-03-06: qty 100

## 2016-03-06 MED ORDER — LACTATED RINGERS IV SOLN
INTRAVENOUS | Status: DC
Start: 2016-03-06 — End: 2016-04-10
  Administered 2016-04-10: 07:00:00 via INTRAVENOUS

## 2016-03-08 ENCOUNTER — Other Ambulatory Visit: Payer: Self-pay | Admitting: Radiology

## 2016-03-08 DIAGNOSIS — N401 Enlarged prostate with lower urinary tract symptoms: Principal | ICD-10-CM

## 2016-03-08 DIAGNOSIS — N138 Other obstructive and reflux uropathy: Secondary | ICD-10-CM

## 2016-03-08 NOTE — Telephone Encounter (Signed)
Called pt to reschedule surgery from 03/06/16 that was cancelled due to the weather. Per pt request, surgery was rescheduled to 04/10/16. Notified pt of pre-admit testing appt on 04/01/16 @11 :00 & to call day prior to surgery for arrival time to [facility]. Advised pt to resume ASA 81mg  now & to hold it beginning 04/03/16 until after surgery on 04/10/16 per Dr Erlene Quan. Pt voices understanding.

## 2016-04-01 ENCOUNTER — Inpatient Hospital Stay: Admission: RE | Admit: 2016-04-01 | Payer: Medicare Other | Source: Ambulatory Visit

## 2016-04-04 ENCOUNTER — Encounter
Admission: RE | Admit: 2016-04-04 | Discharge: 2016-04-04 | Disposition: A | Discharge status: Home / Self Care | Payer: Medicare Other | Source: Ambulatory Visit | Attending: Urology | Admitting: Urology

## 2016-04-04 DIAGNOSIS — N401 Enlarged prostate with lower urinary tract symptoms: Secondary | ICD-10-CM | POA: Diagnosis not present

## 2016-04-04 DIAGNOSIS — N320 Bladder-neck obstruction: Secondary | ICD-10-CM | POA: Diagnosis not present

## 2016-04-04 DIAGNOSIS — Z01812 Encounter for preprocedural laboratory examination: Secondary | ICD-10-CM | POA: Diagnosis present

## 2016-04-04 LAB — CBC
HCT: 38.5 % — ABNORMAL LOW (ref 40.0–52.0)
Hemoglobin: 13.5 g/dL (ref 13.0–18.0)
MCH: 31.6 pg (ref 26.0–34.0)
MCHC: 35.1 g/dL (ref 32.0–36.0)
MCV: 90 fL (ref 80.0–100.0)
PLATELETS: 170 10*3/uL (ref 150–440)
RBC: 4.28 MIL/uL — AB (ref 4.40–5.90)
RDW: 13.8 % (ref 11.5–14.5)
WBC: 7.2 10*3/uL (ref 3.8–10.6)

## 2016-04-04 NOTE — Patient Instructions (Addendum)
Your procedure is scheduled on: Wednesday 04/11/16 Report to [facility]. 2ND FLOOR MEDICAL MALL ENTRANCE. To find out your arrival time please call [phone] between 1PM - 3PM on Tuesday 04/10/16.  Remember: Instructions that are not followed completely may result in serious medical risk, up to and including death, or upon the discretion of your surgeon and anesthesiologist your surgery may need to be rescheduled.    __X__ 1. Do not eat food or drink liquids after midnight. No gum chewing or hard candies.     __X__ 2. No Alcohol for 24 hours before or after surgery.   ____ 3. Bring all medications with you on the day of surgery if instructed.    __X__ 4. Notify your doctor if there is any change in your medical condition     (cold, fever, infections).             __X___5. No smoking within 24 hours of your surgery.     Do not wear jewelry, make-up, hairpins, clips or nail polish.  Do not wear lotions, powders, or perfumes.   Do not shave 48 hours prior to surgery. Men may shave face and neck.  Do not bring valuables to the hospital.    [facility] is not responsible for any belongings or valuables.               Contacts, dentures or bridgework may not be worn into surgery.  Leave your suitcase in the car. After surgery it may be brought to your room.  For patients admitted to the hospital, discharge time is determined by your                treatment team.   Patients discharged the day of surgery will not be allowed to drive home.   Please read over the following fact sheets that you were given:   Pain Booklet and MRSA Information   __X__ Take these medicines the morning of surgery with A SIP OF WATER:    1. FINASTERIDE  2. LISINOPRIL  3. OMEPRAZOLE  4. TAMSULOSIN  5. zyrtec  6.  ____ Fleet Enema (as directed)   ____ Use CHG Soap as directed  ____ Use inhalers on the day of surgery  ____ Stop metformin 2 days prior to surgery    ____ Take 1/2 of usual insulin dose  the night before surgery and none on the morning of surgery.   __X__ Stop Coumadin/Plavix/aspirin on TODAY  __X__ Stop Anti-inflammatories such as Advil, Aleve, Ibuprofen, Motrin, Naproxen, Naprosyn, Goodies,powder, or aspirin products.  OK to take Tylenol.   __X__ Stop supplements until after surgery.  VITAMIN E  ____ Bring C-Pap to the hospital.

## 2016-04-05 LAB — URINE CULTURE

## 2016-04-08 ENCOUNTER — Telehealth: Payer: Self-pay | Admitting: Radiology

## 2016-04-08 ENCOUNTER — Ambulatory Visit (INDEPENDENT_AMBULATORY_CARE_PROVIDER_SITE_OTHER): Payer: Medicare Other

## 2016-04-08 ENCOUNTER — Other Ambulatory Visit: Payer: Medicare Other

## 2016-04-08 ENCOUNTER — Other Ambulatory Visit: Payer: Self-pay | Admitting: Radiology

## 2016-04-08 ENCOUNTER — Other Ambulatory Visit: Payer: Self-pay

## 2016-04-08 DIAGNOSIS — N138 Other obstructive and reflux uropathy: Secondary | ICD-10-CM

## 2016-04-08 DIAGNOSIS — N401 Enlarged prostate with lower urinary tract symptoms: Secondary | ICD-10-CM

## 2016-04-08 NOTE — Progress Notes (Signed)
In and Out Catheterization  Patient is present today for a I & O catheterization due to urinalysis . Patient was cleaned and prepped in a sterile fashion with betadine and Lidocaine 2% jelly was instilled into the urethra.  A 14FR cath was inserted no complications were noted , 54ml of urine return was noted, urine was yellow in color. A clean urine sample was collected for UA and culture. Bladder was drained  And catheter was removed with out difficulty.    Preformed by: Fonnie Jarvis, CMA

## 2016-04-08 NOTE — Telephone Encounter (Signed)
-----   Message from Hollice Espy, MD sent at 04/08/2016 11:13 AM EST ----- No associated UA to make an assessment whether this is real or contraminat.  Can you please have him come in a provide that ASAP?  Otherwise we may have to reschedue....  Hollice Espy, MD  ----- Message ----- From: Ranell Patrick, RN Sent: 04/08/2016  10:16 AM To: Hollice Espy, MD  Does this ucx need to be repeated? He is having laser ablation of the prostate with the thulium laser on 04/10/16.

## 2016-04-08 NOTE — Telephone Encounter (Signed)
Appt made.  Pt aware.

## 2016-04-09 ENCOUNTER — Telehealth: Payer: Self-pay | Admitting: Radiology

## 2016-04-09 LAB — URINALYSIS, COMPLETE
BILIRUBIN UA: NEGATIVE
GLUCOSE, UA: NEGATIVE
KETONES UA: NEGATIVE
Leukocytes, UA: NEGATIVE
NITRITE UA: NEGATIVE
Protein, UA: NEGATIVE
RBC, UA: NEGATIVE
Specific Gravity, UA: 1.015 (ref 1.005–1.030)
UUROB: 0.2 mg/dL (ref 0.2–1.0)
pH, UA: 6.5 (ref 5.0–7.5)

## 2016-04-09 NOTE — Telephone Encounter (Signed)
-----   Message from Hollice Espy, MD sent at 04/09/2016  7:39 AM EST ----- UA looks good. No issues for tomorrow.    Hollice Espy, MD

## 2016-04-10 ENCOUNTER — Encounter
Admission: RE | Disposition: A | Discharge status: Home / Self Care | Payer: Self-pay | Source: Ambulatory Visit | Attending: Urology

## 2016-04-10 ENCOUNTER — Ambulatory Visit
Admission: RE | Admit: 2016-04-10 | Discharge: 2016-04-10 | Disposition: A | Discharge status: Home / Self Care | Payer: Medicare Other | Source: Ambulatory Visit | Attending: Urology | Admitting: Urology

## 2016-04-10 ENCOUNTER — Ambulatory Visit: Payer: Medicare Other | Admitting: Certified Registered Nurse Anesthetist

## 2016-04-10 ENCOUNTER — Encounter: Payer: Self-pay | Admitting: *Deleted

## 2016-04-10 DIAGNOSIS — Z905 Acquired absence of kidney: Secondary | ICD-10-CM | POA: Insufficient documentation

## 2016-04-10 DIAGNOSIS — R3915 Urgency of urination: Secondary | ICD-10-CM | POA: Insufficient documentation

## 2016-04-10 DIAGNOSIS — Z79899 Other long term (current) drug therapy: Secondary | ICD-10-CM | POA: Diagnosis not present

## 2016-04-10 DIAGNOSIS — N3289 Other specified disorders of bladder: Secondary | ICD-10-CM | POA: Insufficient documentation

## 2016-04-10 DIAGNOSIS — I10 Essential (primary) hypertension: Secondary | ICD-10-CM | POA: Diagnosis not present

## 2016-04-10 DIAGNOSIS — E7800 Pure hypercholesterolemia, unspecified: Secondary | ICD-10-CM | POA: Insufficient documentation

## 2016-04-10 DIAGNOSIS — K219 Gastro-esophageal reflux disease without esophagitis: Secondary | ICD-10-CM | POA: Diagnosis not present

## 2016-04-10 DIAGNOSIS — Z96651 Presence of right artificial knee joint: Secondary | ICD-10-CM | POA: Diagnosis not present

## 2016-04-10 DIAGNOSIS — N138 Other obstructive and reflux uropathy: Secondary | ICD-10-CM | POA: Insufficient documentation

## 2016-04-10 DIAGNOSIS — N401 Enlarged prostate with lower urinary tract symptoms: Secondary | ICD-10-CM | POA: Insufficient documentation

## 2016-04-10 DIAGNOSIS — R351 Nocturia: Secondary | ICD-10-CM | POA: Diagnosis not present

## 2016-04-10 DIAGNOSIS — Z87891 Personal history of nicotine dependence: Secondary | ICD-10-CM | POA: Insufficient documentation

## 2016-04-10 DIAGNOSIS — R338 Other retention of urine: Secondary | ICD-10-CM | POA: Diagnosis not present

## 2016-04-10 DIAGNOSIS — Z7982 Long term (current) use of aspirin: Secondary | ICD-10-CM | POA: Insufficient documentation

## 2016-04-10 DIAGNOSIS — N135 Crossing vessel and stricture of ureter without hydronephrosis: Secondary | ICD-10-CM | POA: Diagnosis not present

## 2016-04-10 HISTORY — PX: PROSTATE ABLATION: SHX6042

## 2016-04-10 HISTORY — PX: THULIUM LASER TURP (TRANSURETHRAL RESECTION OF PROSTATE): SHX6744

## 2016-04-10 SURGERY — ABLATION, PROSTATE
Anesthesia: General | Site: Prostate | Wound class: Clean Contaminated

## 2016-04-10 MED ORDER — OXYBUTYNIN CHLORIDE 5 MG PO TABS
5.0000 mg | ORAL_TABLET | Freq: Once | ORAL | Status: AC
  Administered 2016-04-10: 5 mg via ORAL
  Filled 2016-04-10: qty 1

## 2016-04-10 MED ORDER — DOCUSATE SODIUM 100 MG PO CAPS: 100.0000 mg | ORAL_CAPSULE | Freq: Two times a day (BID) | ORAL | 0 refills | Status: DC

## 2016-04-10 MED ORDER — LIDOCAINE HCL (CARDIAC) 20 MG/ML IV SOLN
INTRAVENOUS | Status: DC | PRN
  Administered 2016-04-10: 50 mg via INTRAVENOUS

## 2016-04-10 MED ORDER — ROCURONIUM BROMIDE 50 MG/5ML IV SOSY
PREFILLED_SYRINGE | INTRAVENOUS | Status: AC
  Filled 2016-04-10: qty 5

## 2016-04-10 MED ORDER — EPHEDRINE SULFATE 50 MG/ML IJ SOLN
INTRAMUSCULAR | Status: AC
  Filled 2016-04-10: qty 1

## 2016-04-10 MED ORDER — CEFAZOLIN SODIUM-DEXTROSE 2-4 GM/100ML-% IV SOLN
2.0000 g | INTRAVENOUS | Status: AC
  Administered 2016-04-10: 2 g via INTRAVENOUS

## 2016-04-10 MED ORDER — FENTANYL CITRATE (PF) 100 MCG/2ML IJ SOLN
25.0000 ug | INTRAMUSCULAR | Status: DC | PRN
  Administered 2016-04-10: 50 ug via INTRAVENOUS

## 2016-04-10 MED ORDER — FENTANYL CITRATE (PF) 100 MCG/2ML IJ SOLN
INTRAMUSCULAR | Status: AC
  Filled 2016-04-10: qty 2

## 2016-04-10 MED ORDER — EPHEDRINE SULFATE 50 MG/ML IJ SOLN
INTRAMUSCULAR | Status: DC | PRN
  Administered 2016-04-10 (×2): 5 mg via INTRAVENOUS

## 2016-04-10 MED ORDER — SEVOFLURANE IN SOLN
RESPIRATORY_TRACT | Status: AC
  Filled 2016-04-10: qty 250

## 2016-04-10 MED ORDER — FENTANYL CITRATE (PF) 100 MCG/2ML IJ SOLN
INTRAMUSCULAR | Status: AC
  Administered 2016-04-10: 50 ug via INTRAVENOUS
  Filled 2016-04-10: qty 2

## 2016-04-10 MED ORDER — FENTANYL CITRATE (PF) 100 MCG/2ML IJ SOLN
INTRAMUSCULAR | Status: DC | PRN
  Administered 2016-04-10: 25 ug via INTRAVENOUS
  Administered 2016-04-10: 50 ug via INTRAVENOUS
  Administered 2016-04-10: 25 ug via INTRAVENOUS

## 2016-04-10 MED ORDER — LIDOCAINE HCL (PF) 2 % IJ SOLN
INTRAMUSCULAR | Status: AC
  Filled 2016-04-10: qty 2

## 2016-04-10 MED ORDER — DEXAMETHASONE SODIUM PHOSPHATE 10 MG/ML IJ SOLN
INTRAMUSCULAR | Status: AC
  Filled 2016-04-10: qty 1

## 2016-04-10 MED ORDER — PHENYLEPHRINE HCL 10 MG/ML IJ SOLN
INTRAMUSCULAR | Status: DC | PRN
  Administered 2016-04-10: 100 ug via INTRAVENOUS

## 2016-04-10 MED ORDER — DEXAMETHASONE SODIUM PHOSPHATE 10 MG/ML IJ SOLN
INTRAMUSCULAR | Status: DC | PRN
  Administered 2016-04-10: 10 mg via INTRAVENOUS

## 2016-04-10 MED ORDER — CEFAZOLIN SODIUM-DEXTROSE 2-4 GM/100ML-% IV SOLN
INTRAVENOUS | Status: AC
  Administered 2016-04-10: 2 g via INTRAVENOUS
  Filled 2016-04-10: qty 100

## 2016-04-10 MED ORDER — ONDANSETRON HCL 4 MG/2ML IJ SOLN: 4.0000 mg | Freq: Once | INTRAMUSCULAR | Status: DC | PRN

## 2016-04-10 MED ORDER — MIDAZOLAM HCL 2 MG/2ML IJ SOLN
INTRAMUSCULAR | Status: AC
  Filled 2016-04-10: qty 2

## 2016-04-10 MED ORDER — LACTATED RINGERS IV SOLN: INTRAVENOUS | Status: DC

## 2016-04-10 MED ORDER — ONDANSETRON HCL 4 MG/2ML IJ SOLN
INTRAMUSCULAR | Status: DC | PRN
  Administered 2016-04-10: 4 mg via INTRAVENOUS

## 2016-04-10 MED ORDER — PHENYLEPHRINE HCL 10 MG/ML IJ SOLN
INTRAMUSCULAR | Status: AC
  Filled 2016-04-10: qty 1

## 2016-04-10 MED ORDER — ACETAMINOPHEN 10 MG/ML IV SOLN
INTRAVENOUS | Status: DC | PRN
  Administered 2016-04-10: 1000 mg via INTRAVENOUS

## 2016-04-10 MED ORDER — HYDROCODONE-ACETAMINOPHEN 5-325 MG PO TABS
1.0000 | ORAL_TABLET | Freq: Four times a day (QID) | ORAL | 0 refills | Status: DC | PRN
Start: 2016-04-10 — End: 2016-04-22

## 2016-04-10 MED ORDER — SODIUM CHLORIDE 0.9 % IJ SOLN
INTRAMUSCULAR | Status: AC
  Filled 2016-04-10: qty 10

## 2016-04-10 MED ORDER — MIDAZOLAM HCL 2 MG/2ML IJ SOLN
INTRAMUSCULAR | Status: DC | PRN
  Administered 2016-04-10: 2 mg via INTRAVENOUS

## 2016-04-10 MED ORDER — ACETAMINOPHEN 10 MG/ML IV SOLN
INTRAVENOUS | Status: AC
  Filled 2016-04-10: qty 100

## 2016-04-10 MED ORDER — ONDANSETRON HCL 4 MG/2ML IJ SOLN
INTRAMUSCULAR | Status: AC
  Filled 2016-04-10: qty 2

## 2016-04-10 MED ORDER — PROPOFOL 10 MG/ML IV BOLUS
INTRAVENOUS | Status: DC | PRN
  Administered 2016-04-10: 150 mg via INTRAVENOUS

## 2016-04-10 MED ORDER — SUCCINYLCHOLINE CHLORIDE 200 MG/10ML IV SOSY
PREFILLED_SYRINGE | INTRAVENOUS | Status: AC
  Filled 2016-04-10: qty 10

## 2016-04-10 MED ORDER — OXYBUTYNIN CHLORIDE 5 MG PO TABS: 5.0000 mg | ORAL_TABLET | Freq: Three times a day (TID) | ORAL | 0 refills | Status: DC | PRN

## 2016-04-10 MED ORDER — PROPOFOL 10 MG/ML IV BOLUS
INTRAVENOUS | Status: AC
  Filled 2016-04-10: qty 20

## 2016-04-10 SURGICAL SUPPLY — 48 items
ADAPTER IRRIG TUBE 2 SPIKE SOL (ADAPTER) ×6 IMPLANT
BAG URO DRAIN 2000ML W/SPOUT (MISCELLANEOUS) IMPLANT
BAG URO DRAIN 4000ML (MISCELLANEOUS) ×3 IMPLANT
CATH FOL 2WAY LX 20X30 (CATHETERS) ×3 IMPLANT
CATH FOL 2WAY LX 22X30 (CATHETERS) IMPLANT
CATH FOL LEG HOLDER (MISCELLANEOUS) ×3 IMPLANT
CATH FOLEY 2WAY  5CC 20FR SIL (CATHETERS)
CATH FOLEY 2WAY 5CC 20FR SIL (CATHETERS) IMPLANT
CATH FOLEY 3WAY 30CC 22FR (CATHETERS) IMPLANT
CATH URETL 5X70 OPEN END (CATHETERS) IMPLANT
CONTAINER COLLECT MORCELLATR (MISCELLANEOUS) IMPLANT
DRAPE SHEET LG 3/4 BI-LAMINATE (DRAPES) ×3 IMPLANT
DRAPE UTILITY 15X26 TOWEL STRL (DRAPES) ×3 IMPLANT
FILTER OVERFLOW MORCELLATOR (FILTER) IMPLANT
GLOVE BIO SURGEON STRL SZ 6.5 (GLOVE) ×4 IMPLANT
GLOVE BIO SURGEONS STRL SZ 6.5 (GLOVE) ×2
GOWN STRL REUS W/ TWL LRG LVL3 (GOWN DISPOSABLE) ×2 IMPLANT
GOWN STRL REUS W/ TWL XL LVL3 (GOWN DISPOSABLE) ×1 IMPLANT
GOWN STRL REUS W/TWL LRG LVL3 (GOWN DISPOSABLE) ×4
GOWN STRL REUS W/TWL XL LVL3 (GOWN DISPOSABLE) ×2
IV NS 1000ML (IV SOLUTION)
IV NS 1000ML BAXH (IV SOLUTION) IMPLANT
IV SET PRIMARY 15D 139IN B9900 (IV SETS) IMPLANT
KIT RM TURNOVER CYSTO AR (KITS) ×3 IMPLANT
LASER FIBER 550M SMARTSCOPE (Laser) IMPLANT
LASER GRNLGT 950 (MISCELLANEOUS) IMPLANT
LASER GRNLGT MOXY FIBER 750UM (MISCELLANEOUS) IMPLANT
LASER REVOLIX PROCEDURE (MISCELLANEOUS) ×3 IMPLANT
MORCELLATOR COLLECT CONTAINER (MISCELLANEOUS)
MORCELLATOR OVERFLOW FILTER (FILTER)
MORCELLATOR ROTATION 4.75 335 (MISCELLANEOUS) IMPLANT
PACK CYSTO AR (MISCELLANEOUS) ×3 IMPLANT
PREP PVP WINGED SPONGE (MISCELLANEOUS) IMPLANT
SENSORWIRE 0.038 NOT ANGLED (WIRE)
SET CYSTO W/LG BORE CLAMP LF (SET/KITS/TRAYS/PACK) IMPLANT
SET IRRIG Y TYPE TUR BLADDER L (SET/KITS/TRAYS/PACK) ×3 IMPLANT
SLEEVE PROTECTION STRL DISP (MISCELLANEOUS) IMPLANT
SOL .9 NS 3000ML IRR  AL (IV SOLUTION) ×8
SOL .9 NS 3000ML IRR UROMATIC (IV SOLUTION) ×4 IMPLANT
SOL PREP PVP 2OZ (MISCELLANEOUS)
SOLUTION PREP PVP 2OZ (MISCELLANEOUS) IMPLANT
SURGILUBE 2OZ TUBE FLIPTOP (MISCELLANEOUS) ×3 IMPLANT
SYRINGE IRR TOOMEY STRL 70CC (SYRINGE) ×3 IMPLANT
TUBE PUMP MORCELLATOR PIRANHA (TUBING) IMPLANT
TUBING CONNECTING 10 (TUBING) IMPLANT
TUBING CONNECTING 10' (TUBING)
WATER STERILE IRR 1000ML POUR (IV SOLUTION) ×3 IMPLANT
WIRE SENSOR 0.038 NOT ANGLED (WIRE) IMPLANT

## 2016-04-10 NOTE — OR Nursing (Signed)
Demonstrated with wife how to remove foley catheter.

## 2016-04-10 NOTE — Op Note (Signed)
Date of procedure: 04/10/16  Preoperative diagnosis:  1. BPH with bladder outlet obstruction  2. History of urinary retention  Postoperative diagnosis:  1. Same as above   Procedure: 1. Transurethral laser ablation of the prostate  Surgeon: Hollice Espy, MD  Anesthesia: General  Complications: None  Intraoperative findings: Mildly elevated bladder neck with bilobar coaptation. Heavily trabeculated bladder.  EBL: Minimal  Specimens: None  Drains: 20 French two-way Foley catheter  Indication: Craig [name] is a 71 y.o. patient with BPH on maximal medical therapy, history of urinary retention with persistent severe symptoms.  After reviewing the management options for treatment, he elected to proceed with the above surgical procedure(s). We have discussed the potential benefits and risks of the procedure, side effects of the proposed treatment, the likelihood of the patient achieving the goals of the procedure, and any potential problems that might occur during the procedure or recuperation. Informed consent has been obtained.  Description of procedure:  The patient was taken to the operating room and general anesthesia was induced.  The patient was placed in the dorsal lithotomy position, prepped and draped in the usual sterile fashion, and preoperative antibiotics were administered. A preoperative time-out was performed.   The 89 French resectoscope using a coud-tip blood obturator was introduced per urethra into the bladder. The lens and laser bridge were brought in. The bladder is carefully inspected and there is no evidence of tumors or ulcerations. The bladder was moderately to heavily trabeculated. The trigone was in normal anatomic position with the UOs identified a good distance from the bladder neck. The bladder neck was mildly elevated. There is no significant median lobe. There was significant bilobar coaptation within the prostatic urethra but overall, relatively short  prostatic length. The 500  thulium laser fiber was brought in and first, 2 incisions were created at the 5:00 and 7:00 positions at the bladder neck until bladder neck fibers were encountered. These incisions were carried down just proximal to the apex and the very small median lobe was enucleated in a caudal to cranial direction until was cleaned from the bladder neck. Next, starting on the right side, the lateral lobe was methodically taken down to the level of the capsule leaving a small amount of apical tissue to help preserve continence. The same exact procedure was performed taking down the left lateral lobe. Care was taken to avoid any ablation beyond the verumontanum. Hemostasis was excellent. There is minimal bleeding noted. Once complete, the small chip and within the bladder was drained along with other debris. At the end of the procedure, wide open troughs and a lower bladder neck was achieved. The scope was then removed. A 20 French two-way Foley catheter was in place with 30 cc of sterile water in the balloon. The patient was then cleaned and dried, repositioned the supine position reversed with seizure, taken to the PACU in stable condition.  Plan: Patient will remove his own catheter tomorrow morning. He will follow up in 6 weeks with uroflow, PVR. He will continue finasteride and Flomax until that time.  Hollice Espy, M.D.

## 2016-04-10 NOTE — H&P (Signed)
01/19/2016  --> updated today 04/10/16 without change.  UA negative.  RRR/ CTAB. 1:29 PM   Craig [name] 08-Sep-1945 [id number]  Referring provider: Tracie Harrier, MD [address] [facility] [place], [place] [address]      Chief Complaint  Patient presents with  . 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    He also has a history of baseline severe urinary symptoms, and urinary retention. He has severe daytime urgency and nocturia 3. He denies any incontinence. His urinary symptoms did improve somewhat well and antibiotics but have recurred. He's had elevated postvoid residuals in the past. He's been on Flomax and finasteride chronically and continues to have severe urinary symptoms.  Cystoscopy 01/03/16 with bilobar coaptation and intravesical median lobe component, estimated calculated prostate volume 65 cc based on most recent cross-sectional imaging, CT abdomen pelvis Currently on maximal medical therapy with continued incomplete bladder emptying, urgency, frequency Personal history of urinary retention Currently on maximal medical therapy in the form of flowmax/ finasteride Most recent PSA 0.8,rectal exam 50 cc, diffusely firm without nodules on 11/2015 I do feel the patient would benefit from an outlet procedure- he would like to discuss this further separate appointment where his wife could be present, will arrange for this  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 Surgery was uncomplicated. 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 No malignancy was identified.   PMH:     Past Medical History:    Diagnosis Date  . BPH (benign prostatic hyperplasia)   . Cancer ([place])    skin cancer   . GERD (gastroesophageal reflux disease)   . History of hiatal hernia   . Hypercholesterolemia   . Hypertension     Surgical History:      Past Surgical History:  Procedure Laterality Date  . IR GENERIC HISTORICAL  09/29/2015   IR NEPHROSTOMY PLACEMENT LEFT 09/29/2015 [name], MD [facility]  . IR GENERIC HISTORICAL  10/20/2015   IR NEPHROSTOGRAM LEFT THRU EXISTING ACCESS 10/20/2015 [facility]  . JOINT REPLACEMENT    . LAPAROSCOPIC NEPHRECTOMY, HAND ASSISTED Left 11/07/2015   Procedure: HAND ASSISTED LAPAROSCOPIC NEPHRECTOMY;  Surgeon: Hollice Espy, MD;  Location: [facility] ORS;  Service: Urology;  Laterality: Left;  . REPLACEMENT TOTAL KNEE Right   . TONSILLECTOMY      Home Medications:        Medication List           Accurate as of 01/19/16  1:29 PM. Always use your most recent med list.           aspirin EC 81 MG tablet Take 81 mg by mouth daily.   cetirizine 10 MG tablet Commonly known as:  ZYRTEC Take 10 mg by mouth daily.   docusate sodium 100 MG capsule Commonly known as:  COLACE Take 100 mg by mouth 2 (two) times daily.   docusate sodium 100 MG capsule Commonly known as:  COLACE Take 1 capsule (100 mg total) by mouth 2 (two) times daily.   finasteride 5 MG tablet Commonly known as:  PROSCAR Take 5 mg by mouth daily.   fluticasone 50 MCG/ACT nasal spray Commonly known as:  FLONASE Place 2 sprays into both nostrils daily.  lisinopril 30 MG tablet Commonly known as:  PRINIVIL,ZESTRIL Take 30 mg by mouth daily.   omeprazole 40 MG capsule Commonly known as:  PRILOSEC Take 40 mg by mouth 2 (two) times daily.   simvastatin 40 MG tablet Commonly known as:  ZOCOR Take 40 mg by mouth daily.   tamsulosin 0.4 MG Caps capsule Commonly known as:  FLOMAX Take 0.4 mg by mouth daily.   traMADol 50 MG tablet Commonly known  as:  ULTRAM       Allergies:       Allergies  Allergen Reactions  . Peanut Oil Other (See Comments)    Mouth ulcers Mouth ulcers    Family History:      Family History  Problem Relation Age of Onset  . Kidney cancer Mother   . Lung cancer Father   . Prostate cancer Neg Hx   . Bladder Cancer Neg Hx     Social History:  reports that he quit smoking about 32 years ago. He has a 30.00 pack-year smoking history. He has never used smokeless tobacco. He reports that he does not drink alcohol or use drugs.  ROS: UROLOGY Frequent Urination?: Yes Hard to postpone urination?: No Burning/pain with urination?: No Get up at night to urinate?: Yes Leakage of urine?: No Urine stream starts and stops?: No Trouble starting stream?: No Do you have to strain to urinate?: No Blood in urine?: No Urinary tract infection?: No Sexually transmitted disease?: No Injury to kidneys or bladder?: No Painful intercourse?: No Weak stream?: No Erection problems?: No Penile pain?: No  Gastrointestinal Nausea?: No Vomiting?: No Indigestion/heartburn?: No Diarrhea?: No Constipation?: No  Constitutional Fever: No Night sweats?: No Weight loss?: No Fatigue?: No  Skin Skin rash/lesions?: No Itching?: No  Eyes Blurred vision?: No Double vision?: No  Ears/Nose/Throat Sore throat?: No Sinus problems?: No  Hematologic/Lymphatic Swollen glands?: No Easy bruising?: No  Cardiovascular Leg swelling?: No Chest pain?: No  Respiratory Cough?: No Shortness of breath?: No  Endocrine Excessive thirst?: No  Musculoskeletal Back pain?: No Joint pain?: No  Neurological Headaches?: No Dizziness?: No  Psychologic Depression?: No Anxiety?: No  Physical Exam: BP (!) 145/80 (BP Location: Right Arm, Patient Position: Sitting, Cuff Size: Large)   Pulse 60   Ht 5\' 11"  (1.803 m)   Wt 231 lb (104.8 kg)   BMI 32.22 kg/m   Constitutional:  Alert and  oriented, No acute distress.  Accompanied by wife today. HEENT: [place] AT, moist mucus membranes.  Trachea midline, no masses. Cardiovascular: No clubbing, cyanosis, or edema. CTAB. Respiratory: Normal respiratory effort, no increased work of breathing. RRR. GI: Abdomen is soft, nontender, nondistended, no abdominal masses GU: No CVA tenderness. Skin: No rashes, bruises or suspicious lesions. Neurologic: Grossly intact, no focal deficits, moving all 4 extremities. Psychiatric: Normal mood and affect.  Laboratory Data: RecentLabs       Lab Results  Component Value Date   WBC 7.3 11/08/2015   HGB 12.5 (L) 11/08/2015   HCT 36.1 (L) 11/08/2015   MCV 92.6 11/08/2015   PLT 136 (L) 11/08/2015      RecentLabs       Lab Results  Component Value Date   CREATININE 1.27 12/06/2015      Assessment & Plan:    1. BPH with obstruction/lower urinary tract symptoms Role of outlet procedure was discussed today in detail. He does have both irritative and obstructive urinary symptoms. We discussed that an outlet procedure to help him with his outlet symptoms  including decreased stream, history of retention, and incomplete bladder emptying and may or may not help with his urgency frequency symptoms.  Various surgical techniques were discussed including traditional TURP, laser ablation of the prostate, holmium laser enucleation of the prostate, and other new or procedures urolift.  Risk and benefits of each were reviewed in detail.  He is most interested in laser ablation of the prostate. This can be done as an outpatient and will require Foley catheter for one to 3 days postop. Given his history of postoperative retention, I would like to keep his catheter for slightly longer to avoid issues with postoperative retention.  Specifically, risk of bleeding, infection, damage to surrounding structures, bladder neck or urethral strictures/contracture, retrograde ejaculation, worsening of  irritative voiding symptoms, failure of the procedure were discussed.  He will stop his aspirin one week prior to surgery.  Preoperative urine culture will be obtained.  All of his questions were answered.  - Urine culture; Future  2. History of urinary retention As above  3. 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

## 2016-04-10 NOTE — Discharge Instructions (Signed)
Your Foley catheter will remain for 24 hours. Tomorrow morning, hook the syringe up to the balloon port and empty out all the fluid.  There are 30 cc in the balloon. Emptying completely. Once this is performed, pulled gently until the catheter is removed. If this is painful, please call our office.     Transurethral Resection of the Prostate, Care After Introduction Refer to this sheet in the next few weeks. These instructions provide you with information about caring for yourself after your procedure. Your health care provider may also give you more specific instructions. Your treatment has been planned according to current medical practices, but problems sometimes occur. Call your health care provider if you have any problems or questions after your procedure. What can I expect after the procedure? After the procedure, it is common to have:  Mild pain in your lower abdomen.  Soreness or mild discomfort in your penis from having the catheter inserted during the procedure.  A feeling of urgency when you need to urinate.  A small amount of blood in your urine. You may notice some small blood clots in your urine. These are normal. Follow these instructions at home: Medicines  Take over-the-counter and prescription medicines only as told by your health care provider.  Do not drive or operate heavy machinery while taking prescription pain medicine.  Do not drive for 24 hours if you received a sedative.  If you were prescribed antibiotic medicine, take it as told by your health care provider. Do not stop taking the antibiotic even if you start to feel better. Activity  Return to your normal activities as told by your health care provider. Ask your health care provider what activities are safe for you.  Do not lift anything that is heavier than 10 lb (4.5 kg) for 3 weeks after your procedure, or as long as told by your health care provider.  Avoid intense physical activity for as long as  told by your health care provider.  Walk at least one time every day. This helps to prevent blood clots. You may increase your physical activity gradually as you start to feel better. Lifestyle  Do not drink alcohol for as long as told by your health care provider. This is especially important if you are taking prescription pain medicines.  Do not engage in sexual activity until your health care provider says that you can do this. General instructions  Do not take baths, swim, or use a hot tub until your health care provider approves.  Drink enough fluid to keep your urine clear or pale yellow.  Urinate as soon as you feel the need to. Do not try to hold your urine for long periods of time.  If your health care provider approves, you may take a stool softener for 2-3 weeks to prevent you from straining to have a bowel movement.  Wear compression stockings as told by your health care provider. These stockings help to prevent blood clots and reduce swelling in your legs.  Keep all follow-up visits as told by your health care provider. This is important. Contact a health care provider if:  You have difficulty urinating.  You have a fever.  You have pain that gets worse or does not improve with medicine.  You have blood in your urine that does not go away after 1 week of resting and drinking more fluids.  You have swelling in your penis or testicles. Get help right away if:  You are unable to  urinate.  You are having more blood clots in your urine instead of fewer.  You have:  Large blood clots.  A lot of blood in your urine.  Pain in your back or lower abdomen.  Pain or swelling in your legs.  Chills and you are shaking. This information is not intended to replace advice given to you by your health care provider. Make sure you discuss any questions you have with your health care provider. Document Released: 02/04/2005 Document Revised: 10/08/2015 Document Reviewed:  10/27/2014  2017 Elsevier Foley Catheter Care, Adult A Foley catheter is a soft, flexible tube that is placed into the bladder to drain urine. A Foley catheter may be inserted if:  You leak urine or are not able to control when you urinate (urinary incontinence).  You are not able to urinate when you need to (urinary retention).  You had prostate surgery or surgery on the genitals.  You have certain medical conditions, such as multiple sclerosis, dementia, or a spinal cord injury. If you are going home with a Foley catheter in place, follow the instructions below. TAKING CARE OF THE CATHETER 1. Wash your hands with soap and water. 2. Using mild soap and warm water on a clean washcloth:  Clean the area on your body closest to the catheter insertion site using a circular motion, moving away from the catheter. Never wipe toward the catheter because this could sweep bacteria up into the urethra and cause infection.  Remove all traces of soap. Pat the area dry with a clean towel. For males, reposition the foreskin. 3. Attach the catheter to your leg so there is no tension on the catheter. Use adhesive tape or a leg strap. If you are using adhesive tape, remove any sticky residue left behind by the previous tape you used. 4. Keep the drainage bag below the level of the bladder, but keep it off the floor. 5. Check throughout the day to be sure the catheter is working and urine is draining freely. Make sure the tubing does not become kinked. 6. Do not pull on the catheter or try to remove it. Pulling could damage internal tissues. TAKING CARE OF THE DRAINAGE BAGS You will be given two drainage bags to take home. One is a large overnight drainage bag, and the other is a smaller leg bag that fits underneath clothing. You may wear the overnight bag at any time, but you should never wear the smaller leg bag at night. Follow the instructions below for how to empty, change, and clean your drainage  bags. Emptying the Drainage Bag  You must empty your drainage bag when it is ?- full or at least 2-3 times a day. 1. Wash your hands with soap and water. 2. Keep the drainage bag below your hips, below the level of your bladder. This stops urine from going back into the tubing and into your bladder. 3. Hold the dirty bag over the toilet or a clean container. 4. Open the pour spout at the bottom of the bag and empty the urine into the toilet or container. Do not let the pour spout touch the toilet, container, or any other surface. Doing so can place bacteria on the bag, which can cause an infection. 5. Clean the pour spout with a gauze pad or cotton ball that has rubbing alcohol on it. 6. Close the pour spout. 7. Attach the bag to your leg with adhesive tape or a leg strap. 8. Wash your hands well. Changing  the Drainage Bag  Change your drainage bag once a month or sooner if it starts to smell bad or look dirty. Below are steps to follow when changing the drainage bag. 1. Wash your hands with soap and water. 2. Pinch off the rubber catheter so that urine does not spill out. 3. Disconnect the catheter tube from the drainage tube at the connection valve. Do not let the tubes touch any surface. 4. Clean the end of the catheter tube with an alcohol wipe. Use a different alcohol wipe to clean the end of the drainage tube. 5. Connect the catheter tube to the drainage tube of the clean drainage bag. 6. Attach the new bag to the leg with adhesive tape or a leg strap. Avoid attaching the new bag too tightly. 7. Wash your hands well. Cleaning the Drainage Bag  1. Wash your hands with soap and water. 2. Wash the bag in warm, soapy water. 3. Rinse the bag thoroughly with warm water. 4. Fill the bag with a solution of white vinegar and water (1 cup vinegar to 1 qt warm water [.2 L vinegar to 1 L warm water]). Close the bag and soak it for 30 minutes in the solution. 5. Rinse the bag with warm  water. 6. Hang the bag to dry with the pour spout open and hanging downward. 7. Store the clean bag (once it is dry) in a clean plastic bag. 8. Wash your hands well. PREVENTING INFECTION  Wash your hands before and after handling your catheter.  Take showers daily and wash the area where the catheter enters your body. Do not take baths. Replace wet leg straps with dry ones, if this applies.  Do not use powders, sprays, or lotions on the genital area. Only use creams, lotions, or ointments as directed by your caregiver.  For females, wipe from front to back after each bowel movement.  Drink enough fluids to keep your urine clear or pale yellow unless you have a fluid restriction.  Do not let the drainage bag or tubing touch or lie on the floor.  Wear cotton underwear to absorb moisture and to keep your skin drier. SEEK MEDICAL CARE IF:   Your urine is cloudy or smells unusually bad.  Your catheter becomes clogged.  You are not draining urine into the bag or your bladder feels full.  Your catheter starts to leak. SEEK IMMEDIATE MEDICAL CARE IF:   You have pain, swelling, redness, or pus where the catheter enters the body.  You have pain in the abdomen, legs, lower back, or bladder.  You have a fever.  You see blood fill the catheter, or your urine is pink or red.  You have nausea, vomiting, or chills.  Your catheter gets pulled out. MAKE SURE YOU:   Understand these instructions.  Will watch your condition.  Will get help right away if you are not doing well or get worse. This information is not intended to replace advice given to you by your health care provider. Make sure you discuss any questions you have with your health care provider. Document Released: 02/04/2005 Document Revised: 06/21/2013 Document Reviewed: 01/21/2015 Elsevier Interactive Patient Education  2017 [employer].

## 2016-04-10 NOTE — Anesthesia Post-op Follow-up Note (Cosign Needed)
Anesthesia QCDR form completed.        

## 2016-04-10 NOTE — Transfer of Care (Signed)
Immediate Anesthesia Transfer of Care Note  Patient: Craig [name]  Procedure(s) Performed: Procedure(s): PROSTATE ABLATION (N/A) THULIUM LASER TURP (TRANSURETHRAL RESECTION OF PROSTATE) (N/A)  Patient Location: PACU  Anesthesia Type:General  Level of Consciousness: sedated  Airway & Oxygen Therapy: Patient Spontanous Breathing and Patient connected to face mask oxygen  Post-op Assessment: Report given to RN and Post -op Vital signs reviewed and stable  Post vital signs: Reviewed and stable  Last Vitals:  Vitals:   04/10/16 0620 04/10/16 0929  BP: (!) 125/91 125/76  Pulse: 65 69  Resp: 16 20  Temp: 36.8 C 36.2 C    Last Pain:  Vitals:   04/10/16 0929  TempSrc: Tympanic         Complications: No apparent anesthesia complications

## 2016-04-10 NOTE — Anesthesia Preprocedure Evaluation (Signed)
Anesthesia Evaluation  Patient identified by MRN, date of birth, ID band Patient awake    Reviewed: Allergy & Precautions, NPO status , Patient's Chart, lab work & pertinent test results  History of Anesthesia Complications Negative for: history of anesthetic complications  Airway Mallampati: III  TM Distance: >3 FB Neck ROM: Full    Dental  (+) Poor Dentition   Pulmonary neg sleep apnea, neg COPD, former smoker,           Cardiovascular Exercise Tolerance: Good hypertension, Pt. on medications (-) CAD and (-) Past MI  Rhythm:Regular Rate:Normal - Systolic murmurs and - Diastolic murmurs    Neuro/Psych negative neurological ROS  negative psych ROS   GI/Hepatic Neg liver ROS, hiatal hernia, GERD  Medicated,  Endo/Other  negative endocrine ROSneg diabetes  Renal/GU Renal disease     Musculoskeletal  (+) Arthritis ,   Abdominal (+) - obese,   Peds  Hematology negative hematology ROS (+)   Anesthesia Other Findings Past Medical History: No date: BPH (benign prostatic hyperplasia) No date: Cancer ([facility])     Comment: skin cancer  No date: GERD (gastroesophageal reflux disease) No date: History of hiatal hernia No date: Hypercholesterolemia No date: Hypertension   Reproductive/Obstetrics                             Anesthesia Physical  Anesthesia Plan  ASA: III  Anesthesia Plan: General   Post-op Pain Management:    Induction: Intravenous  Airway Management Planned: Oral ETT  Additional Equipment:   Intra-op Plan:   Post-operative Plan: Extubation in OR  Informed Consent: I have reviewed the patients History and Physical, chart, labs and discussed the procedure including the risks, benefits and alternatives for the proposed anesthesia with the patient or authorized representative who has indicated his/her understanding and acceptance.   Dental advisory given  Plan  Discussed with: CRNA and Anesthesiologist  Anesthesia Plan Comments:         Anesthesia Quick Evaluation

## 2016-04-10 NOTE — Anesthesia Postprocedure Evaluation (Signed)
Anesthesia Post Note  Patient: Craig [name]  Procedure(s) Performed: Procedure(s) (LRB): PROSTATE ABLATION (N/A) THULIUM LASER TURP (TRANSURETHRAL RESECTION OF PROSTATE) (N/A)  Patient location during evaluation: PACU Anesthesia Type: General Level of consciousness: awake and alert Pain management: pain level controlled Vital Signs Assessment: post-procedure vital signs reviewed and stable Respiratory status: spontaneous breathing, nonlabored ventilation, respiratory function stable and patient connected to nasal cannula oxygen Cardiovascular status: blood pressure returned to baseline and stable Postop Assessment: no signs of nausea or vomiting Anesthetic complications: no     Last Vitals:  Vitals:   04/10/16 1036 04/10/16 1106  BP: 130/82 128/79  Pulse: 63 65  Resp: 14   Temp:      Last Pain:  Vitals:   04/10/16 1030  TempSrc:   PainSc: 2                  Martha Clan

## 2016-04-10 NOTE — Anesthesia Procedure Notes (Signed)
Procedure Name: LMA Insertion Date/Time: 04/10/2016 7:56 AM Performed by: Johnna Acosta Pre-anesthesia Checklist: Patient identified, Emergency Drugs available, Suction available, Patient being monitored and Timeout performed Patient Re-evaluated:Patient Re-evaluated prior to inductionOxygen Delivery Method: Circle system utilized Preoxygenation: Pre-oxygenation with 100% oxygen Intubation Type: IV induction LMA: LMA inserted LMA Size: 5.0 Tube type: Oral Number of attempts: 1 Placement Confirmation: positive ETCO2 and breath sounds checked- equal and bilateral Tube secured with: Tape Dental Injury: Teeth and Oropharynx as per pre-operative assessment

## 2016-04-11 LAB — CULTURE, URINE COMPREHENSIVE

## 2016-04-14 ENCOUNTER — Encounter: Payer: Self-pay | Admitting: Emergency Medicine

## 2016-04-14 ENCOUNTER — Emergency Department
Admission: EM | Admit: 2016-04-14 | Discharge: 2016-04-14 | Disposition: A | Discharge status: Home / Self Care | Payer: Medicare Other | Attending: Emergency Medicine | Admitting: Emergency Medicine

## 2016-04-14 DIAGNOSIS — R339 Retention of urine, unspecified: Secondary | ICD-10-CM

## 2016-04-14 DIAGNOSIS — Z79899 Other long term (current) drug therapy: Secondary | ICD-10-CM | POA: Diagnosis not present

## 2016-04-14 DIAGNOSIS — Z87891 Personal history of nicotine dependence: Secondary | ICD-10-CM | POA: Insufficient documentation

## 2016-04-14 DIAGNOSIS — I10 Essential (primary) hypertension: Secondary | ICD-10-CM | POA: Insufficient documentation

## 2016-04-14 DIAGNOSIS — Z85828 Personal history of other malignant neoplasm of skin: Secondary | ICD-10-CM | POA: Diagnosis not present

## 2016-04-14 DIAGNOSIS — Z7982 Long term (current) use of aspirin: Secondary | ICD-10-CM | POA: Diagnosis not present

## 2016-04-14 DIAGNOSIS — N390 Urinary tract infection, site not specified: Secondary | ICD-10-CM | POA: Insufficient documentation

## 2016-04-14 LAB — URINALYSIS, COMPLETE (UACMP) WITH MICROSCOPIC
Bacteria, UA: NONE SEEN
Bilirubin Urine: NEGATIVE
GLUCOSE, UA: NEGATIVE mg/dL
Ketones, ur: NEGATIVE mg/dL
Nitrite: POSITIVE — AB
PROTEIN: 30 mg/dL — AB
Specific Gravity, Urine: 1.008 (ref 1.005–1.030)
pH: 7 (ref 5.0–8.0)

## 2016-04-14 MED ORDER — CEPHALEXIN 500 MG PO CAPS: 500.0000 mg | ORAL_CAPSULE | Freq: Two times a day (BID) | ORAL | 0 refills | Status: DC

## 2016-04-14 MED ORDER — CEPHALEXIN 500 MG PO CAPS
500.0000 mg | ORAL_CAPSULE | Freq: Once | ORAL | Status: AC
  Administered 2016-04-14: 500 mg via ORAL
  Filled 2016-04-14: qty 1

## 2016-04-14 NOTE — ED Triage Notes (Signed)
Patient from home via [facility]. Reports he had a prostate surgery on Wednesday and his foley removed Thursday. Reports regular urination until around 7pm Saturday night. Reports intense pain beginning around 8 pm. States he was able to void very small amounts but not able to empty it. Upon arrival patient is pacing in room and tachypnic.

## 2016-04-14 NOTE — ED Provider Notes (Signed)
[facility] Emergency Department Provider Note  Time seen: 2:31 AM  I have reviewed the triage vital signs and the nursing notes.   HISTORY  Chief Complaint Urinary Retention    HPI Craig [name] is a 71 y.o. male with a past medical history of BPH, hypertension, hyperlipidemia status post prostate procedure 4 days ago who presents to the emergency department with urinary retention. According to the patient he had a prostate surgery performed 4 days ago. He had a catheter placed after the operation which is pulled the following day, 3 days ago. Patient states he had been urinating well with occasional small amount of blood in his urine. Today he states he last urinated this afternoon but has not been able to urinate this evening or night. Patient developed severe lower abdominal pain so he came to the emergency department for evaluation.  Past Medical History:  Diagnosis Date  . BPH (benign prostatic hyperplasia)   . Cancer ([place])    skin cancer   . GERD (gastroesophageal reflux disease)   . History of hiatal hernia   . Hypercholesterolemia   . Hypertension     Patient Active Problem List   Diagnosis Date Noted  . Hydronephrosis, left 11/07/2015  . Sepsis ([place]) 09/29/2015  . Acute pyelonephritis 09/25/2015  . Frequency of urination 05/29/2015  . BPH (benign prostatic hyperplasia) 09/05/2014  . Hyperlipidemia 09/05/2014  . Enlarged prostate with urinary obstruction 07/03/2014  . History of total knee replacement 06/02/2014  . Status post total right knee replacement 06/02/2014  . Osteoarthritis of knee 05/12/2014  . HTN (hypertension) 06/28/2013  . Hypertension 06/28/2013    Past Surgical History:  Procedure Laterality Date  . IR GENERIC HISTORICAL  09/29/2015   IR NEPHROSTOMY PLACEMENT LEFT 09/29/2015 [name], MD [facility]INTERV RAD  . IR GENERIC HISTORICAL  10/20/2015   IR NEPHROSTOGRAM LEFT THRU EXISTING ACCESS 10/20/2015 [facility]INTER[facility] RAD  . JOINT  REPLACEMENT    . LAPAROSCOPIC NEPHRECTOMY, HAND ASSISTED Left 11/07/2015   Procedure: HAND ASSISTED LAPAROSCOPIC NEPHRECTOMY;  Surgeon: Hollice Espy, MD;  Location: [facility] ORS;  Service: Urology;  Laterality: Left;  . PROSTATE ABLATION N/A 04/10/2016   Procedure: PROSTATE ABLATION;  Surgeon: Hollice Espy, MD;  Location: [facility] ORS;  Service: Urology;  Laterality: N/A;  . REPLACEMENT TOTAL KNEE Right   . THULIUM LASER TURP (TRANSURETHRAL RESECTION OF PROSTATE) N/A 04/10/2016   Procedure: THULIUM LASER TURP (TRANSURETHRAL RESECTION OF PROSTATE);  Surgeon: Hollice Espy, MD;  Location: [facility] ORS;  Service: Urology;  Laterality: N/A;  . TONSILLECTOMY      Prior to Admission medications   Medication Sig Start Date End Date Taking? Authorizing Provider  aspirin EC 81 MG tablet Take 81 mg by mouth daily.    Historical Provider, MD  cetirizine (ZYRTEC) 10 MG tablet Take 10 mg by mouth daily.    Historical Provider, MD  docusate sodium (COLACE) 100 MG capsule Take 1 capsule (100 mg total) by mouth 2 (two) times daily. 11/08/15   Hollice Espy, MD  docusate sodium (COLACE) 100 MG capsule Take 1 capsule (100 mg total) by mouth 2 (two) times daily. 04/10/16   Hollice Espy, MD  finasteride (PROSCAR) 5 MG tablet Take 5 mg by mouth every morning.     Historical Provider, MD  fluticasone (FLONASE) 50 MCG/ACT nasal spray Place 2 sprays into both nostrils daily.    Historical Provider, MD  HYDROcodone-acetaminophen (NORCO/VICODIN) 5-325 MG tablet Take 1-2 tablets by mouth every 6 (six) hours as needed for  moderate pain. 04/10/16   Hollice Espy, MD  lisinopril (PRINIVIL,ZESTRIL) 30 MG tablet Take 30 mg by mouth every morning.     Historical Provider, MD  Multiple Vitamin (MULTIVITAMIN) tablet Take 1 tablet by mouth daily.    Historical Provider, MD  omeprazole (PRILOSEC) 40 MG capsule Take 40 mg by mouth 2 (two) times daily.    Historical Provider, MD  oxybutynin (DITROPAN) 5 MG tablet Take 1 tablet (5 mg total)  by mouth every 8 (eight) hours as needed for bladder spasms. 04/10/16   Hollice Espy, MD  simvastatin (ZOCOR) 40 MG tablet Take 40 mg by mouth daily at 6 PM.     Historical Provider, MD  tamsulosin (FLOMAX) 0.4 MG CAPS capsule Take 0.4 mg by mouth daily.    Historical Provider, MD  traMADol (ULTRAM) 50 MG tablet Take 50 mg by mouth every 6 (six) hours as needed for moderate pain.  11/17/15   Historical Provider, MD  vitamin E 100 UNIT capsule Take 100 Units by mouth daily.    Historical Provider, MD    Allergies  Allergen Reactions  . Peanut Oil Other (See Comments)    Mouth ulcers Mouth ulcers    Family History  Problem Relation Age of Onset  . Kidney cancer Mother   . Lung cancer Father   . Prostate cancer Neg Hx   . Bladder Cancer Neg Hx     Social History Social History  Substance Use Topics  . Smoking status: Former Smoker    Packs/day: 1.50    Years: 20.00    Quit date: 09/25/1983  . Smokeless tobacco: Never Used  . Alcohol use No    Review of Systems Constitutional: Negative for fever. Cardiovascular: Negative for chest pain. Respiratory: Negative for shortness of breath. Gastrointestinal: Lower abdominal pain. Genitourinary: Positive for urinary retention Neurological: Negative for headache 10-point ROS otherwise negative.  ____________________________________________   PHYSICAL EXAM:  VITAL SIGNS: ED Triage Vitals  Enc Vitals Group     BP 04/14/16 0225 (!) 163/95     Pulse Rate 04/14/16 0225 60     Resp 04/14/16 0225 (!) 24     Temp 04/14/16 0225 97.8 F (36.6 C)     Temp Source 04/14/16 0225 Oral     SpO2 04/14/16 0225 95 %     Weight 04/14/16 0222 231 lb (104.8 kg)     Height 04/14/16 0222 5\' 11"  (1.803 m)     Head Circumference --      Peak Flow --      Pain Score 04/14/16 0223 10     Pain Loc --      Pain Edu? --      Excl. in [place]? --     Constitutional: Alert and oriented. Well appearing and in no distress. Eyes: Normal exam ENT    Head: Normocephalic and atraumatic   Mouth/Throat: Mucous membranes are moist. Cardiovascular: Normal rate, regular rhythm. No murmur Respiratory: Normal respiratory effort without tachypnea nor retractions. Breath sounds are clear Gastrointestinal: Soft and nontender (status post Foley insertion) Musculoskeletal: Nontender with normal range of motion in all extremities.  Neurologic:  Normal speech and language. No gross focal neurologic deficits  Skin:  Skin is warm, dry and intact.  Psychiatric: Mood and affect are normal.  ____________________________________________   INITIAL IMPRESSION / ASSESSMENT AND PLAN / ED COURSE  Pertinent labs & imaging results that were available during my care of the patient were reviewed by me and considered in my medical decision  making (see chart for details).  Patient presents to the emergency department with lower abdominal discomfort. Patient has not been able to urinate since this afternoon. Full catheter placed with greater than 600 cc of urine relieved. Patient states complete resolution of abdominal discomfort. We will check a urinalysis, send a urine culture. We will convert to a leg bag, and likely discharge home with follow-up with urology. Patient sees Dr. Erlene Quan and will call Monday morning.  Patient's urinalysis has resulted nitrite positive. We will treat with Keflex. Patient will follow-up with Dr. Erlene Quan. A urine culture has been sent.  ____________________________________________   FINAL CLINICAL IMPRESSION(S) / ED DIAGNOSES  Acute urinary retention Urinary tract infection   Harvest Dark, MD 04/14/16 [phone]

## 2016-04-15 ENCOUNTER — Telehealth: Payer: Self-pay | Admitting: Urology

## 2016-04-15 LAB — URINE CULTURE: Culture: NO GROWTH

## 2016-04-15 NOTE — Telephone Encounter (Signed)
Pt called and was seen in ER this past Saturday night and they put a cath in.  He has a post op appt scheduled for 4/13.  He wants to know when he should come in for cath removal.  Please advise.

## 2016-04-15 NOTE — Telephone Encounter (Signed)
Nurse visit for a voiding trial in 1 week following placement. Preferably, on Monday morning so that if he has issues, were around that he doesn't end up back in the ER.  Please ensure that he continues all BPH meds until then.  Try to avoid oxybutynin if possible.  Hollice Espy, MD

## 2016-04-15 NOTE — Telephone Encounter (Signed)
Spoke with pt in reference to needing a nurse visit voiding trial. Reinforced with pt to continue BPH meds and stop oxybutynin. Pt voiced understanding of whole conversation. Pt was added to nurse schedule.

## 2016-04-22 ENCOUNTER — Ambulatory Visit (INDEPENDENT_AMBULATORY_CARE_PROVIDER_SITE_OTHER): Payer: Medicare Other

## 2016-04-22 VITALS — BP 185/102 | HR 66 | Ht 71.0 in | Wt 235.0 lb

## 2016-04-22 DIAGNOSIS — R339 Retention of urine, unspecified: Secondary | ICD-10-CM | POA: Diagnosis not present

## 2016-04-22 NOTE — Progress Notes (Signed)
Fill and Pull Catheter Removal  Patient is present today for a catheter removal.  Patient was cleaned and prepped in a sterile fashion 200ml of sterile water/ saline was instilled into the bladder when the patient felt the urge to urinate. 8ml of water was then drained from the balloon.  A 16FR foley cath was removed from the bladder no complications were noted .  Patient as then given some time to void on their own.  Patient can void  200ml on their own after some time.  Patient tolerated well.  Preformed by: Dorna Mallet, CMA  

## 2016-05-06 ENCOUNTER — Telehealth: Payer: Self-pay | Admitting: Urology

## 2016-05-06 NOTE — Telephone Encounter (Signed)
Pt called office stating he had procedure done about 3 weeks ago and states he is still has "blood in his pee" Pt is concerned and would like to speak to nurse to make sure everything is normal or okay. States you can call either phone #. Please advise. Thanks.

## 2016-05-06 NOTE — Telephone Encounter (Signed)
LMOM

## 2016-05-07 NOTE — Telephone Encounter (Signed)
Spoke with pt in reference to post op bleeding. Pt stated that blood comes and goes but he is having some dysuria. Pt denied n/v, f/c, or seeing any blood clots. Pt was offered a nurse visit today to check urine. Pt declined due to being at work. Pt was added to nurse schedule for tomorrow.

## 2016-05-08 ENCOUNTER — Ambulatory Visit (INDEPENDENT_AMBULATORY_CARE_PROVIDER_SITE_OTHER): Payer: Medicare Other

## 2016-05-08 VITALS — BP 153/95 | HR 66 | Ht 71.0 in | Wt 233.8 lb

## 2016-05-08 DIAGNOSIS — R300 Dysuria: Secondary | ICD-10-CM

## 2016-05-08 LAB — URINALYSIS, COMPLETE
Bilirubin, UA: NEGATIVE
Glucose, UA: NEGATIVE
Ketones, UA: NEGATIVE
Nitrite, UA: NEGATIVE
PH UA: 6.5 (ref 5.0–7.5)
Specific Gravity, UA: 1.015 (ref 1.005–1.030)
UUROB: 0.2 mg/dL (ref 0.2–1.0)

## 2016-05-08 LAB — MICROSCOPIC EXAMINATION: BACTERIA UA: NONE SEEN

## 2016-05-08 NOTE — Progress Notes (Signed)
Pt presents today with c/o urinary frequency and urgency, hard to postpone urination, dysuria, and blood in urine. Pt has been on abx and had urological surgery in the last 30 days. A clean catch was obtained for u/a and cx.  Blood pressure (!) 153/95, pulse 66, height 5\' 11"  (1.803 m), weight 233 lb 12.8 oz (106.1 kg).

## 2016-05-09 ENCOUNTER — Telehealth: Payer: Self-pay

## 2016-05-09 NOTE — Telephone Encounter (Signed)
-----   Message from Hollice Espy, MD sent at 05/08/2016  4:50 PM EDT ----- UA looks unremarkable.  Urine culture is pending.  Hollice Espy, MD

## 2016-05-09 NOTE — Telephone Encounter (Signed)
LMOM- u/a unremarkable. Waiting for ucx results.

## 2016-05-10 LAB — CULTURE, URINE COMPREHENSIVE

## 2016-05-15 ENCOUNTER — Telehealth: Payer: Self-pay

## 2016-05-15 NOTE — Telephone Encounter (Signed)
Pt called requesting ucx results. Made pt aware ucx was negative. Pt stated that he still is urinating blood and passes an average of 3 clots a day. Pt described the clots to be the side of a eraser on a pencil. Reinforced with pt to drink plenty of water. Pt stated he is. Please advise.

## 2016-05-16 NOTE — Telephone Encounter (Signed)
I wouldn't worry too much about this at this point. He probably is still just having some sloughing of his necrotic prostate tissue from surgery.    If it becomes worse or he can't urinate, let us know. We'll see him at his follow-up schedule.  Hollice Espy, MD

## 2016-05-19 ENCOUNTER — Emergency Department
Admission: EM | Admit: 2016-05-19 | Discharge: 2016-05-19 | Disposition: A | Discharge status: Home / Self Care | Payer: Medicare Other | Attending: Emergency Medicine | Admitting: Emergency Medicine

## 2016-05-19 ENCOUNTER — Encounter: Payer: Self-pay | Admitting: Emergency Medicine

## 2016-05-19 DIAGNOSIS — Z7982 Long term (current) use of aspirin: Secondary | ICD-10-CM | POA: Diagnosis not present

## 2016-05-19 DIAGNOSIS — R1030 Lower abdominal pain, unspecified: Secondary | ICD-10-CM | POA: Diagnosis not present

## 2016-05-19 DIAGNOSIS — Z85828 Personal history of other malignant neoplasm of skin: Secondary | ICD-10-CM | POA: Diagnosis not present

## 2016-05-19 DIAGNOSIS — R339 Retention of urine, unspecified: Secondary | ICD-10-CM | POA: Insufficient documentation

## 2016-05-19 DIAGNOSIS — I10 Essential (primary) hypertension: Secondary | ICD-10-CM | POA: Insufficient documentation

## 2016-05-19 DIAGNOSIS — Z87891 Personal history of nicotine dependence: Secondary | ICD-10-CM | POA: Diagnosis not present

## 2016-05-19 DIAGNOSIS — Z79899 Other long term (current) drug therapy: Secondary | ICD-10-CM | POA: Diagnosis not present

## 2016-05-19 DIAGNOSIS — N358 Other urethral stricture: Secondary | ICD-10-CM | POA: Diagnosis not present

## 2016-05-19 DIAGNOSIS — R338 Other retention of urine: Secondary | ICD-10-CM

## 2016-05-19 LAB — URINALYSIS, COMPLETE (UACMP) WITH MICROSCOPIC
BILIRUBIN URINE: NEGATIVE
Glucose, UA: NEGATIVE mg/dL
KETONES UR: NEGATIVE mg/dL
LEUKOCYTES UA: NEGATIVE
NITRITE: POSITIVE — AB
PROTEIN: NEGATIVE mg/dL
Specific Gravity, Urine: 1.01 (ref 1.005–1.030)
Squamous Epithelial / LPF: NONE SEEN
pH: 6 (ref 5.0–8.0)

## 2016-05-19 LAB — COMPREHENSIVE METABOLIC PANEL
ALBUMIN: 3.9 g/dL (ref 3.5–5.0)
ALK PHOS: 67 U/L (ref 38–126)
ALT: 21 U/L (ref 17–63)
ANION GAP: 7 (ref 5–15)
AST: 24 U/L (ref 15–41)
BUN: 23 mg/dL — ABNORMAL HIGH (ref 6–20)
CO2: 24 mmol/L (ref 22–32)
Calcium: 9.1 mg/dL (ref 8.9–10.3)
Chloride: 106 mmol/L (ref 101–111)
Creatinine, Ser: 1.2 mg/dL (ref 0.61–1.24)
GFR calc Af Amer: >60 mL/min (ref >60)
GFR calc non Af Amer: 60 mL/min — ABNORMAL LOW (ref >60)
GLUCOSE: 111 mg/dL — AB (ref 65–99)
Potassium: 4.4 mmol/L (ref 3.5–5.1)
SODIUM: 137 mmol/L (ref 135–145)
Total Bilirubin: 0.6 mg/dL (ref 0.3–1.2)
Total Protein: 6.7 g/dL (ref 6.5–8.1)

## 2016-05-19 LAB — PROTIME-INR
INR: 0.92
Prothrombin Time: 12.4 seconds (ref 11.4–15.2)

## 2016-05-19 LAB — CBC
HCT: 39.9 % — ABNORMAL LOW (ref 40.0–52.0)
HEMOGLOBIN: 13.8 g/dL (ref 13.0–18.0)
MCH: 31.7 pg (ref 26.0–34.0)
MCHC: 34.7 g/dL (ref 32.0–36.0)
MCV: 91.4 fL (ref 80.0–100.0)
Platelets: 165 10*3/uL (ref 150–440)
RBC: 4.36 MIL/uL — AB (ref 4.40–5.90)
RDW: 14.4 % (ref 11.5–14.5)
WBC: 6.2 10*3/uL (ref 3.8–10.6)

## 2016-05-19 LAB — APTT: aPTT: 28 seconds (ref 24–36)

## 2016-05-19 LAB — LIPASE, BLOOD: Lipase: 21 U/L (ref 11–51)

## 2016-05-19 MED ORDER — ONDANSETRON HCL 4 MG/2ML IJ SOLN
4.0000 mg | Freq: Once | INTRAMUSCULAR | Status: AC
  Administered 2016-05-19: 4 mg via INTRAVENOUS
  Filled 2016-05-19: qty 2

## 2016-05-19 MED ORDER — MORPHINE SULFATE (PF) 4 MG/ML IV SOLN
6.0000 mg | Freq: Once | INTRAVENOUS | Status: AC
  Administered 2016-05-19: 6 mg via INTRAVENOUS
  Filled 2016-05-19: qty 2

## 2016-05-19 NOTE — Op Note (Signed)
[name], Craig [name]:  [id number]  MEDICAL RECORD [name]:  [id number]  LOCATION:  ED08A                        FACILITY:  [facility]  PHYSICIAN:  Marshall Cork. Jeffie Pollock, M.D.    DATE OF BIRTH:  10-15-1945  DATE OF PROCEDURE:  05/19/2016 DATE OF DISCHARGE:                              OPERATIVE REPORT   PROCEDURE PERFORMED:  Cystoscopy with placement of Foley catheter.  SURGEON:  Marshall Cork. Jeffie Pollock, M.D.  PREOPERATIVE DIAGNOSIS:  Urinary retention.  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  SURGEON:  Marshall Cork. Jeffie Pollock, MD.  ANESTHESIA:  None.  DRAIN:  18-French Council catheter.  BLOOD LOSS:  None.  COMPLICATIONS:  None.  INDICATIONS:  [name] is a 71 year old white male with a history of laser prostate ablation on February 21 by Dr. Hollice Espy.  He had to have a Foley placed approximately 4 days after the procedure, but had been voiding okay with some frequency and urgency and small clots until this morning when he presented to the emergency room in severe abdominal pain with a distended bladder.  Two attempts at Foley placement with a straight cath and a coude were unsuccessful.  FINDINGS AND PROCEDURE:  I examined the patient and found blood in meatus.  He was prepped with Betadine solution, draped with sterile towels.  A single attempt with an 18-French coude catheter was performed but resistance was met.  I then draped the patient further with sterile towels and re-prepped. He then underwent cystoscopy after urethral lubrication.  Inspection revealed blood in the bulbar urethra suggestive of a false passage, although I did not visualize it well because of the bleeding.  I was able to find the urethral lumen.  There was mild stricturing in apical prostate, but the scope did pass without significant difficulty.  There was some fresh blood in the prostatic urethra.  Examination of  bladder was difficult due to concentrated/bloody urine, but [name] large clots were identified.  At this point, a guidewire was passed through the cystoscope into the bladder and the cystoscope was removed.  An 18-French Council catheter was passed over the wire with some resistance in the area of the prostate, but I was able to get it into the bladder without too much trouble.  The balloon was filled with 10 mL sterile fluid.  The wire was then removed.  The catheter was placed on straight drainage with return of concentrated but otherwise clear urine, over 500 mL of urine was returned.  There were [name] complications.     Marshall Cork. Jeffie Pollock, M.D.     JJW/MEDQ  D:  05/19/2016  T:  05/19/2016  Job:  [id number]  cc:   Hollice Espy, MD

## 2016-05-19 NOTE — ED Notes (Signed)
Techs, Waunita Schooner and Pam assisted RN with clean up of pt and changing of linen. Pt changed into gown and placed on monitor. Pt given warm blanket and stated he is comfortable at this time.

## 2016-05-19 NOTE — ED Notes (Signed)
2 attempts with catheter (1 coude and 1 foley). Not successful. Md notified and urology will be paged.

## 2016-05-19 NOTE — Consult Note (Signed)
Subjective: I was asked to see Craig [name] in consultation by Dr. Jacqualine Code for catheter placement.  Craig [name] is a patient of Dr. Erlene Quan who had a laser ablation of the prostate on 04/10/16.  He voided well initially following the procedure but then went into retention requiring a foley 4 days post op.  The foley was removed on 3/5 but he began to have symptoms and was seen in the urology clinic on 3/21.  A urine was checked but not a PVR.   He continued to pass a few small clots but was voiding until this morning when was unable to void.   He presented to the ER in severe pain and 2 attempts at foley placement were unsuccessful.   He continues to have severe pain in the suprapubic area and is unable to void.   ROS:  Review of Systems  Unable to perform ROS: Medical condition    Allergies  Allergen Reactions  . Peanut Oil Other (See Comments)    Mouth ulcers Mouth ulcers    Past Medical History:  Diagnosis Date  . BPH (benign prostatic hyperplasia)   . Cancer ([place])    skin cancer   . GERD (gastroesophageal reflux disease)   . History of hiatal hernia   . Hypercholesterolemia   . Hypertension     Past Surgical History:  Procedure Laterality Date  . IR GENERIC HISTORICAL  09/29/2015   IR NEPHROSTOMY PLACEMENT LEFT 09/29/2015 [name], MD [facility]INTER[facility] RAD  . IR GENERIC HISTORICAL  10/20/2015   IR NEPHROSTOGRAM LEFT THRU EXISTING ACCESS 10/20/2015 [facility]  . JOINT REPLACEMENT    . LAPAROSCOPIC NEPHRECTOMY, HAND ASSISTED Left 11/07/2015   Procedure: HAND ASSISTED LAPAROSCOPIC NEPHRECTOMY;  Surgeon: Hollice Espy, MD;  Location: [facility] ORS;  Service: Urology;  Laterality: Left;  . PROSTATE ABLATION N/A 04/10/2016   Procedure: PROSTATE ABLATION;  Surgeon: Hollice Espy, MD;  Location: [facility] ORS;  Service: Urology;  Laterality: N/A;  . REPLACEMENT TOTAL KNEE Right   . THULIUM LASER TURP (TRANSURETHRAL RESECTION OF PROSTATE) N/A 04/10/2016   Procedure: THULIUM LASER TURP (TRANSURETHRAL  RESECTION OF PROSTATE);  Surgeon: Hollice Espy, MD;  Location: [facility] ORS;  Service: Urology;  Laterality: N/A;  . TONSILLECTOMY      Social History   Social History  . Marital status: Married    Spouse name: N/A  . Number of children: N/A  . Years of education: N/A   Occupational History  . Not on file.   Social History Main Topics  . Smoking status: Former Smoker    Packs/day: 1.50    Years: 20.00    Quit date: 09/25/1983  . Smokeless tobacco: Never Used  . Alcohol use No  . Drug use: No  . Sexual activity: Not on file   Other Topics Concern  . Not on file   Social History Narrative  . No narrative on file    Family History  Problem Relation Age of Onset  . Kidney cancer Mother   . Lung cancer Father   . Prostate cancer Neg Hx   . Bladder Cancer Neg Hx     Anti-infectives: Anti-infectives    None      No current facility-administered medications for this encounter.    Current Outpatient Prescriptions  Medication Sig Dispense Refill  . aspirin EC 81 MG tablet Take 81 mg by mouth daily.    . cetirizine (ZYRTEC) 10 MG tablet Take 10 mg by mouth daily.    Marland Kitchen docusate sodium (COLACE)  100 MG capsule Take 1 capsule (100 mg total) by mouth 2 (two) times daily. 60 capsule 0  . finasteride (PROSCAR) 5 MG tablet Take 5 mg by mouth every morning.   5  . fluticasone (FLONASE) 50 MCG/ACT nasal spray Place 2 sprays into both nostrils daily.  3  . lisinopril (PRINIVIL,ZESTRIL) 30 MG tablet Take 30 mg by mouth every morning.   4  . Multiple Vitamin (MULTIVITAMIN) tablet Take 1 tablet by mouth daily.    Marland Kitchen omeprazole (PRILOSEC) 40 MG capsule Take 40 mg by mouth 2 (two) times daily.  10  . simvastatin (ZOCOR) 40 MG tablet Take 40 mg by mouth daily at 6 PM.   5  . tamsulosin (FLOMAX) 0.4 MG CAPS capsule Take 0.4 mg by mouth daily.  3  . traMADol (ULTRAM) 50 MG tablet Take 50 mg by mouth every 6 (six) hours as needed for moderate pain.   0  . vitamin E 100 UNIT capsule Take 100  Units by mouth daily.       Objective: Vital signs in last 24 hours: Weight:  [105.7 kg (233 lb)] 105.7 kg (233 lb) (04/01 0931)  Intake/Output from previous day: No intake/output data recorded. Intake/Output this shift: No intake/output data recorded.   Physical Exam  Constitutional:  WD, WN, WM writhing in pain  Abdominal: Soft. He exhibits mass (SP area). There is tenderness.  Genitourinary:  Genitourinary Comments: Uncircumcised phallus with blood at the meatus.   Vitals reviewed.   Lab Results:   Recent Labs  05/19/16 1016  WBC 6.2  HGB 13.8  HCT 39.9*  PLT 165   BMET No results for input(s): NA, K, CL, CO2, GLUCOSE, BUN, CREATININE, CALCIUM in the last 72 hours. PT/INR  Recent Labs  05/19/16 1016  LABPROT 12.4  INR 0.92   ABG No results for input(s): PHART, HCO3 in the last 72 hours.  Invalid input(s): PCO2, PO2  Studies/Results: No results found.  I have reviewed the hospital record, his labs and prior films and discussed the case with Dr. Jacqualine Code.  Cystoscopy with foley placement done.   See procedure note. 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   Foley was placed successful over a guidewire post cystoscopy.     He appears to have a poorly functional bladder and may benefit from Urodynamics to guide further therapy.    CC: Dr. Delman Kitten and Dr. Hollice Espy.     Barbarajean Kinzler J 05/19/2016 [id number]

## 2016-05-19 NOTE — ED Provider Notes (Signed)
[facility] Emergency Department Provider Note   ____________________________________________   First MD Initiated Contact with Patient 05/19/16 [phone]     (approximate)  I have reviewed the triage vital signs and the nursing notes.   HISTORY  Chief Complaint Dysuria    HPI Craig [name] is a 71 y.o. male long history of urologic problems. He reports that he's had difficulty urinating with blood in his urine for at least a month, had a Foley catheter removed from urology clinic a few weeks ago. Over the last week he began experiencing straining with urination, but today he was unable to urinate at all and is having severe lower abdominal pain, what he described as "severe spasms" in the bladder region and inability urinate with nausea. No vomiting. He has not had any fevers or chills but has experienced burning with urination over the last week as well with blood and clots in it  Fairly reports severe pain worse pains ever experienced in the lower abdomen.  No chest pain or trouble breathing. No numbness tingling or weakness  Past Medical History:  Diagnosis Date  . BPH (benign prostatic hyperplasia)   . Cancer ([place])    skin cancer   . GERD (gastroesophageal reflux disease)   . History of hiatal hernia   . Hypercholesterolemia   . Hypertension     Patient Active Problem List   Diagnosis Date Noted  . Hydronephrosis, left 11/07/2015  . Sepsis ([place]) 09/29/2015  . Acute pyelonephritis 09/25/2015  . Frequency of urination 05/29/2015  . BPH (benign prostatic hyperplasia) 09/05/2014  . Hyperlipidemia 09/05/2014  . Enlarged prostate with urinary obstruction 07/03/2014  . History of total knee replacement 06/02/2014  . Status post total right knee replacement 06/02/2014  . Osteoarthritis of knee 05/12/2014  . HTN (hypertension) 06/28/2013  . Hypertension 06/28/2013    Past Surgical History:  Procedure Laterality Date  . IR GENERIC HISTORICAL   09/29/2015   IR NEPHROSTOMY PLACEMENT LEFT 09/29/2015 [name], MD [facility]  . IR GENERIC HISTORICAL  10/20/2015   IR NEPHROSTOGRAM LEFT THRU EXISTING ACCESS 10/20/2015 [facility]  . JOINT REPLACEMENT    . LAPAROSCOPIC NEPHRECTOMY, HAND ASSISTED Left 11/07/2015   Procedure: HAND ASSISTED LAPAROSCOPIC NEPHRECTOMY;  Surgeon: Hollice Espy, MD;  Location: [facility] ORS;  Service: Urology;  Laterality: Left;  . PROSTATE ABLATION N/A 04/10/2016   Procedure: PROSTATE ABLATION;  Surgeon: Hollice Espy, MD;  Location: [facility] ORS;  Service: Urology;  Laterality: N/A;  . REPLACEMENT TOTAL KNEE Right   . THULIUM LASER TURP (TRANSURETHRAL RESECTION OF PROSTATE) N/A 04/10/2016   Procedure: THULIUM LASER TURP (TRANSURETHRAL RESECTION OF PROSTATE);  Surgeon: Hollice Espy, MD;  Location: [facility] ORS;  Service: Urology;  Laterality: N/A;  . TONSILLECTOMY      Prior to Admission medications   Medication Sig Start Date End Date Taking? Authorizing Provider  aspirin EC 81 MG tablet Take 81 mg by mouth daily.   Yes Historical Provider, MD  cetirizine (ZYRTEC) 10 MG tablet Take 10 mg by mouth daily.   Yes Historical Provider, MD  docusate sodium (COLACE) 100 MG capsule Take 1 capsule (100 mg total) by mouth 2 (two) times daily. 04/10/16  Yes Hollice Espy, MD  finasteride (PROSCAR) 5 MG tablet Take 5 mg by mouth every morning.    Yes Historical Provider, MD  fluticasone (FLONASE) 50 MCG/ACT nasal spray Place 2 sprays into both nostrils daily.   Yes Historical Provider, MD  lisinopril (PRINIVIL,ZESTRIL) 30 MG tablet Take  30 mg by mouth every morning.    Yes Historical Provider, MD  Multiple Vitamin (MULTIVITAMIN) tablet Take 1 tablet by mouth daily.   Yes Historical Provider, MD  omeprazole (PRILOSEC) 40 MG capsule Take 40 mg by mouth 2 (two) times daily.   Yes Historical Provider, MD  simvastatin (ZOCOR) 40 MG tablet Take 40 mg by mouth daily at 6 PM.    Yes Historical Provider, MD  tamsulosin (FLOMAX) 0.4 MG  CAPS capsule Take 0.4 mg by mouth daily.   Yes Historical Provider, MD  traMADol (ULTRAM) 50 MG tablet Take 50 mg by mouth every 6 (six) hours as needed for moderate pain.  11/17/15  Yes Historical Provider, MD  vitamin E 100 UNIT capsule Take 100 Units by mouth daily.   Yes Historical Provider, MD    Allergies Peanut oil  Family History  Problem Relation Age of Onset  . Kidney cancer Mother   . Lung cancer Father   . Prostate cancer Neg Hx   . Bladder Cancer Neg Hx     Social History Social History  Substance Use Topics  . Smoking status: Former Smoker    Packs/day: 1.50    Years: 20.00    Quit date: 09/25/1983  . Smokeless tobacco: Never Used  . Alcohol use No    Review of Systems Constitutional: No fever/chills Eyes: No visual changes. ENT: No sore throat. Cardiovascular: Denies chest pain. Respiratory: Denies shortness of breath. Gastrointestinal:   No diarrhea.  No constipation. Genitourinary: See history of present illness Musculoskeletal: Negative for back pain. Skin: Negative for rash. Neurological: Negative for headaches, focal weakness or numbness.  10-point ROS otherwise negative.  ____________________________________________   PHYSICAL EXAM:  VITAL SIGNS: ED Triage Vitals [05/19/16 0931]  Enc Vitals Group     BP      Pulse      Resp      Temp      Temp src      SpO2      Weight 233 lb (105.7 kg)     Height 5\' 11"  (1.803 m)     Head Circumference      Peak Flow      Pain Score 10     Pain Loc      Pain Edu?      Excl. in [place]?     Constitutional: Alert and oriented. Writhing in bed, appears extremely uncomfortable and in severe pain. Reports severe lower abdominal pain  Eyes: Conjunctivae are normal. PERRL. EOMI. Head: Atraumatic. Nose: No congestion/rhinnorhea. Mouth/Throat: Mucous membranes are moist.  Oropharynx non-erythematous. Neck: No stridor. Cardiovascular: Normal rate, regular rhythm. Grossly normal heart sounds.  Good peripheral  circulation. Respiratory: Normal respiratory effort.  No retractions. Lungs CTAB. Gastrointestinal: Soft and notably distended suprapubically with severe tenderness to any attempt at palpation in the lower abdomen..  Some blood at the distal tip of the urethral meatus. Musculoskeletal: No lower extremity tenderness nor edema.   Neurologic:  Normal speech and language. No gross focal neurologic deficits are appreciated. Skin:  Skin is warm, dry and intact. No rash noted. Psychiatric: Mood and affect are normal. Speech and behavior are normal.  ____________________________________________   LABS (all labs ordered are listed, but only abnormal results are displayed)  Labs Reviewed  CBC - Abnormal; Notable for the following:       Result Value   RBC 4.36 (*)    HCT 39.9 (*)    All other components within normal limits  COMPREHENSIVE METABOLIC  PANEL - Abnormal; Notable for the following:    Glucose, Bld 111 (*)    BUN 23 (*)    GFR calc non Af Amer 60 (*)    All other components within normal limits  URINALYSIS, COMPLETE (UACMP) WITH MICROSCOPIC - Abnormal; Notable for the following:    Color, Urine AMBER (*)    APPearance CLEAR (*)    Hgb urine dipstick LARGE (*)    Nitrite POSITIVE (*)    Bacteria, UA RARE (*)    All other components within normal limits  URINE CULTURE  LIPASE, BLOOD  PROTIME-INR  APTT   ____________________________________________  EKG   ____________________________________________  RADIOLOGY    Previous CT scan reviewed, he does not have a history of any acute aortic pathology that is notable. ____________________________________________   PROCEDURES  Procedure(s) performed: None  Procedures  Critical Care performed: No  ____________________________________________   INITIAL IMPRESSION / ASSESSMENT AND PLAN / ED COURSE  Pertinent labs & imaging results that were available during my care of the patient were reviewed by me and considered  in my medical decision making (see chart for details).  Patient presents with urinary retention severe suprapubic abdominal pain. The diagnosis primarily focused on urologic etiology, but also considered other etiologies including aortic aneurysm that previous imaging would argue against this as well as his long-standing history of recent urinary retention and urologic issues. After successful catheter placement, the patient's pain and symptoms were fully relieved. Discussed the case with urology, they recommend discharge and that the patient wouldn't not require any antibiotic at this time. He'll follow closely with Dr. Cherrie Gauze office and his artery taking Flomax.  Clinical Course as of May 20 1214  Sun May 19, 2016  1975 Awaiting call back from Dr. Jeffie Pollock. Foley and caude unsuccessful. Patient remains in pain.  [MQ]    Clinical Course User Index [MQ] Delman Kitten, MD   ----------------------------------------- 10:05 AM on 05/19/2016 -----------------------------------------  Spoke with Dr. Jeffie Pollock, he is coming to evaluate the patient from the OR.  ----------------------------------------- 12:17 PM on 05/19/2016 -----------------------------------------  Awake and alert. Fully relieved with regard to symptoms at this time. Foley catheter working well. Comfortable with the plan to be discharged Return precautions and treatment recommendations and follow-up discussed with the patient who is agreeable with the plan.  Family will be driving him home. ____________________________________________   FINAL CLINICAL IMPRESSION(S) / ED DIAGNOSES  Final diagnoses:  Urinary retention      NEW MEDICATIONS STARTED DURING THIS VISIT:  New Prescriptions   No medications on file     Note:  This document was prepared using Dragon voice recognition software and may include unintentional dictation errors.     Delman Kitten, MD 05/19/16 1218

## 2016-05-19 NOTE — ED Triage Notes (Signed)
Pt reports difficulty urinating, reports had prostate surgery 5 weeks ago, reports hasn't had problems for past 3 weeks until last night. Pt reports bladder pain.

## 2016-05-19 NOTE — Discharge Instructions (Addendum)
Indwelling Urinary Catheter Care, Adult Taking good care of your catheter will keep it working properly and help prevent problems from developing. How to wear your catheter Attach your catheter to your leg with adhesive tape or a leg strap. Make sure there is no tension on the catheter. If you use adhesive tape, first remove any sticky residue from the previous tape you used. How to wear a drainage bag  You should have received a large overnight drainage bag and a smaller leg bag that fits underneath clothing. You may wear the overnight bag at any time, but you should never wear the smaller leg bag at night.  Always keep the overnight drainage bag below the level of your bladder, but keep it off the floor. When you sleep, hang the bag inside a wastebasket that is covered by a clean plastic bag.  Always wear the leg bag below your knee. Keep the leg bag secure with a leg strap or adhesive tape. How to care for your skin  Clean the skin around the catheter at least once every day.  Shower every day. Do not take baths.  Apply creams, lotions, or ointments to your genital area only as told by your health care provider.  Do not use powders, sprays, or lotions on your genital area. How to clean your catheter and your skin 1. Wash your hands with soap and water. 2. Wet a washcloth in warm water and mild soap. 3. Use the washcloth to clean the skin where the catheter enters your body. Clean downward, wiping away from the catheter in small circles. Do not wipe toward the catheter. This can push bacteria into the urethra and cause infection. 4. Pat the area dry with a clean towel. Make sure to remove all soap. How to care for your drainage bags Empty your drainage bag when it is ?- full, or at least 2-3 times a day. Replace your drainage bag once a month or sooner if it starts to smell bad or look dirty. Do not clean your drainage bag unless told by your health care provider. Emptying a drainage  bag   Supplies Needed  Rubbing alcohol.  Gauze pad or cotton ball.  Adhesive tape or a leg strap. Steps 1. Wash your hands with soap and water. 2. Detach the drainage bag from your leg. 3. Hold the drainage bag over the toilet or a clean container. Keep the drainage bag below your hips and bladder. This stops urine from going back into the tubing and into your bladder. 4. Open the pour spout at the bottom of the bag. 5. Empty the urine into the toilet or container. Do not let the pour spout touch any surface. This helps keep bacteria out of the bag and helps prevent infection. 6. Apply rubbing alcohol to a gauze pad or cotton ball. 7. Use the gauze pad or cotton ball to clean the pour spout. 8. Close the pour spout. 9. Attach the bag to your leg with adhesive tape or a leg strap. 10. Wash your hands. Changing a drainage bag  Supplies Needed  Alcohol wipes.  A clean drainage bag.  Adhesive tape or a leg strap. Steps 1. Wash your hands with soap and water. 2. Detach the dirty drainage bag from your leg. 3. Pinch the rubber catheter with your fingers so that urine does not spill out. 4. Disconnect the catheter tube from the drainage tube at the connection valve. Do not let the tubes touch any surface. 5. Clean  with soap and water.  2. Detach the dirty drainage bag from your leg.  3. Pinch the rubber catheter with your fingers so that urine does not spill out.  4. Disconnect the catheter tube from the drainage tube at the connection valve. Do not let the tubes touch any surface.  5. Clean the end of the catheter tube with an alcohol wipe. Use a different alcohol wipe to clean the end of the drainage tube.  6. Connect the catheter tube to the drainage tube of the clean drainage bag.  7. Attach the new bag to your leg with adhesive tape or a leg strap. Avoid attaching the new bag too tightly.  8. Wash your hands.     How to prevent infection and other problems  · Never pull on your catheter or try to remove it. Pulling can damage your internal tissues.  · Always wash your hands before and after handling your catheter.  · If a leg strap gets wet, replace it with a dry one.  · Drink enough fluids to keep your urine clear or pale yellow, or as told by your  health care provider.  · Do not let the drainage bag or tubing touch the floor.  · Wear cotton underwear. Cotton absorbs moisture and keeps your skin dry.  · If you are male, wipe from front to back after each bowel movement.  · Check the catheter often to make sure that it works properly and the tubing is not twisted or curled.  Contact a health care provider if:  · Your urine is cloudy.  · Your urine smells unusually bad.  · Your urine is not draining into the bag.  · Your catheter gets clogged.  · Your catheter starts to leak.  · Your bladder feels full.  Get help right away if:  · You have redness, swelling, or pain where the catheter enters your body.  · You have fluid, pus, or a bad smell coming from the area where the catheter enters your body.  · The area where the catheter enters your body feels warm to the touch.  · You have a fever.  · You have pain in your abdomen, legs, lower back, or bladder.  · You see blood fill the catheter.  · Your urine is pink or red.  · You have nausea, vomiting, or chills.  · Your catheter gets pulled out.  This information is not intended to replace advice given to you by your health care provider. Make sure you discuss any questions you have with your health care provider.  Document Released: 02/04/2005 Document Revised: 01/03/2016 Document Reviewed: 07/20/2013  Elsevier Interactive Patient Education © 2017 [employer]   

## 2016-05-19 NOTE — ED Notes (Signed)
Pt now resting comfortably. 

## 2016-05-20 LAB — URINE CULTURE
CULTURE: NO GROWTH
SPECIAL REQUESTS: NORMAL

## 2016-05-21 NOTE — Telephone Encounter (Signed)
Spoke with patient he states that he went to the ER over the weekend due to having trouble urinating and increased blood. A foley placement was attempted and unsuccessful. Dr. Roni Bread was consulted and foley was placed over a wire. Patient wanted to know when to follow up, he has a post op apt on 06-01-15. Per Dr. Pilar Jarvis patient should keep foley in until 06-01-15 and make a possible cysto apt to evaluate for stricture or false passage.

## 2016-05-28 ENCOUNTER — Encounter: Payer: Self-pay | Admitting: Intensive Care

## 2016-05-28 ENCOUNTER — Emergency Department
Admission: EM | Admit: 2016-05-28 | Discharge: 2016-05-28 | Disposition: A | Discharge status: Home / Self Care | Payer: Medicare Other | Attending: Emergency Medicine | Admitting: Emergency Medicine

## 2016-05-28 DIAGNOSIS — Z87891 Personal history of nicotine dependence: Secondary | ICD-10-CM | POA: Insufficient documentation

## 2016-05-28 DIAGNOSIS — T83098A Other mechanical complication of other indwelling urethral catheter, initial encounter: Secondary | ICD-10-CM | POA: Diagnosis not present

## 2016-05-28 DIAGNOSIS — Y733 Surgical instruments, materials and gastroenterology and urology devices (including sutures) associated with adverse incidents: Secondary | ICD-10-CM | POA: Diagnosis not present

## 2016-05-28 DIAGNOSIS — N390 Urinary tract infection, site not specified: Secondary | ICD-10-CM | POA: Insufficient documentation

## 2016-05-28 DIAGNOSIS — T839XXA Unspecified complication of genitourinary prosthetic device, implant and graft, initial encounter: Secondary | ICD-10-CM

## 2016-05-28 DIAGNOSIS — Z79899 Other long term (current) drug therapy: Secondary | ICD-10-CM | POA: Insufficient documentation

## 2016-05-28 DIAGNOSIS — R339 Retention of urine, unspecified: Secondary | ICD-10-CM | POA: Diagnosis present

## 2016-05-28 DIAGNOSIS — I10 Essential (primary) hypertension: Secondary | ICD-10-CM | POA: Diagnosis not present

## 2016-05-28 LAB — CBC WITH DIFFERENTIAL/PLATELET
BASOS ABS: 0 10*3/uL (ref 0–0.1)
BASOS PCT: 0 %
EOS PCT: 2 %
Eosinophils Absolute: 0.3 10*3/uL (ref 0–0.7)
HCT: 42 % (ref 40.0–52.0)
Hemoglobin: 14.1 g/dL (ref 13.0–18.0)
Lymphocytes Relative: 10 %
Lymphs Abs: 1.3 10*3/uL (ref 1.0–3.6)
MCH: 30.5 pg (ref 26.0–34.0)
MCHC: 33.6 g/dL (ref 32.0–36.0)
MCV: 90.7 fL (ref 80.0–100.0)
MONO ABS: 1.5 10*3/uL — AB (ref 0.2–1.0)
MONOS PCT: 11 %
Neutro Abs: 10.8 10*3/uL — ABNORMAL HIGH (ref 1.4–6.5)
Neutrophils Relative %: 77 %
PLATELETS: 181 10*3/uL (ref 150–440)
RBC: 4.63 MIL/uL (ref 4.40–5.90)
RDW: 14.1 % (ref 11.5–14.5)
WBC: 14 10*3/uL — ABNORMAL HIGH (ref 3.8–10.6)

## 2016-05-28 LAB — URINALYSIS, COMPLETE (UACMP) WITH MICROSCOPIC
BACTERIA UA: NONE SEEN
BILIRUBIN URINE: NEGATIVE
Glucose, UA: NEGATIVE mg/dL
Ketones, ur: NEGATIVE mg/dL
Nitrite: POSITIVE — AB
Protein, ur: 100 mg/dL — AB
SPECIFIC GRAVITY, URINE: 1.008 (ref 1.005–1.030)
SQUAMOUS EPITHELIAL / LPF: NONE SEEN
pH: 7 (ref 5.0–8.0)

## 2016-05-28 LAB — BASIC METABOLIC PANEL
ANION GAP: 9 (ref 5–15)
BUN: 19 mg/dL (ref 6–20)
CALCIUM: 9.3 mg/dL (ref 8.9–10.3)
CO2: 24 mmol/L (ref 22–32)
Chloride: 101 mmol/L (ref 101–111)
Creatinine, Ser: 1.36 mg/dL — ABNORMAL HIGH (ref 0.61–1.24)
GFR calc non Af Amer: 52 mL/min — ABNORMAL LOW (ref >60)
GFR, EST AFRICAN AMERICAN: 60 mL/min — AB (ref >60)
GLUCOSE: 99 mg/dL (ref 65–99)
Potassium: 4.5 mmol/L (ref 3.5–5.1)
Sodium: 134 mmol/L — ABNORMAL LOW (ref 135–145)

## 2016-05-28 MED ORDER — CEFTRIAXONE SODIUM 1 G IJ SOLR
1.0000 g | Freq: Once | INTRAMUSCULAR | Status: AC
  Administered 2016-05-28: 1 g via INTRAMUSCULAR
  Filled 2016-05-28: qty 10

## 2016-05-28 MED ORDER — OXYCODONE-ACETAMINOPHEN 5-325 MG PO TABS
1.0000 | ORAL_TABLET | Freq: Once | ORAL | Status: AC
  Administered 2016-05-28: 1 via ORAL

## 2016-05-28 MED ORDER — OXYCODONE-ACETAMINOPHEN 5-325 MG PO TABS
ORAL_TABLET | ORAL | Status: AC
  Filled 2016-05-28: qty 1

## 2016-05-28 MED ORDER — CEPHALEXIN 500 MG PO CAPS: 500.0000 mg | ORAL_CAPSULE | Freq: Three times a day (TID) | ORAL | 0 refills | Status: DC

## 2016-05-28 MED ORDER — PHENAZOPYRIDINE HCL 200 MG PO TABS
200.0000 mg | ORAL_TABLET | Freq: Once | ORAL | Status: AC
  Administered 2016-05-28: 200 mg via ORAL
  Filled 2016-05-28 (×2): qty 1

## 2016-05-28 MED ORDER — CEFTRIAXONE SODIUM 1 G IJ SOLR: 1.0000 g | Freq: Once | INTRAMUSCULAR | Status: DC

## 2016-05-28 MED ORDER — CEFTRIAXONE SODIUM 1 G IJ SOLR
INTRAMUSCULAR | Status: AC
  Filled 2016-05-28: qty 10

## 2016-05-28 MED ORDER — OXYCODONE-ACETAMINOPHEN 5-325 MG PO TABS: 1.0000 | ORAL_TABLET | ORAL | 0 refills | Status: DC | PRN

## 2016-05-28 NOTE — ED Triage Notes (Addendum)
Patient states " I have a foley in and for some reason it is not draining. April 1st patient was seen here and had foley placed and d/c home. Urology appointment with Dr Erlene Quan scheduled for this Friday at 9:30" Pt is here today due to his foley not draining and experiencing burning and pain. 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 Prostate surgery of February 2018 to help bladder function better. PAtient has chills at this time, no fever in triage

## 2016-05-28 NOTE — ED Notes (Signed)
Patient states he felt a pop and was incontinent of urine through the penis. Urine was also in the urinary leg bag.Linens were changed and patient was given ED briefs to wear. Patient reported a decrease in the burning sensation after the void.

## 2016-05-28 NOTE — ED Notes (Signed)
Pt states had prostate surgery month and half ago. Ivor Costa he stopped urinating. Wife states they had to call specialist to get foley in. Has had it in since. States now burning and decreased urination. States urine still coming out of foley but not as much as should be. Pt ambulatory, alert, oriented, states percocet helped with pain. Denies bladder spasms at this time.

## 2016-05-28 NOTE — ED Notes (Signed)
66ml shown on bladder scan.

## 2016-05-28 NOTE — ED Provider Notes (Signed)
[facility] Emergency Department Provider Note   ____________________________________________   I have reviewed the triage vital signs and the nursing notes.   HISTORY  Chief Complaint Urinary Retention   History limited by: Not Limited   HPI Craig [name] is a 71 y.o. male who presents to the emergency department today because of concerns for remaining near his catheter site and some decreased output in his catheter. Patient had a catheter placed via urology last week here in the emergency department. He states that today he started having some severe burning. He also felt like he was having headaches and some chills. Patient did continue to have output in his urinary catheter.   Past Medical History:  Diagnosis Date  . BPH (benign prostatic hyperplasia)   . Cancer ([place])    skin cancer   . GERD (gastroesophageal reflux disease)   . History of hiatal hernia   . Hypercholesterolemia   . Hypertension     Patient Active Problem List   Diagnosis Date Noted  . Hydronephrosis, left 11/07/2015  . Sepsis ([place]) 09/29/2015  . Acute pyelonephritis 09/25/2015  . Frequency of urination 05/29/2015  . BPH (benign prostatic hyperplasia) 09/05/2014  . Hyperlipidemia 09/05/2014  . Enlarged prostate with urinary obstruction 07/03/2014  . History of total knee replacement 06/02/2014  . Status post total right knee replacement 06/02/2014  . Osteoarthritis of knee 05/12/2014  . HTN (hypertension) 06/28/2013  . Hypertension 06/28/2013    Past Surgical History:  Procedure Laterality Date  . IR GENERIC HISTORICAL  09/29/2015   IR NEPHROSTOMY PLACEMENT LEFT 09/29/2015 [name], MD [facility]  . IR GENERIC HISTORICAL  10/20/2015   IR NEPHROSTOGRAM LEFT THRU EXISTING ACCESS 10/20/2015 [facility]  . JOINT REPLACEMENT    . LAPAROSCOPIC NEPHRECTOMY, HAND ASSISTED Left 11/07/2015   Procedure: HAND ASSISTED LAPAROSCOPIC NEPHRECTOMY;  Surgeon: Hollice Espy, MD;   Location: [facility] ORS;  Service: Urology;  Laterality: Left;  . PROSTATE ABLATION N/A 04/10/2016   Procedure: PROSTATE ABLATION;  Surgeon: Hollice Espy, MD;  Location: [facility] ORS;  Service: Urology;  Laterality: N/A;  . REPLACEMENT TOTAL KNEE Right   . THULIUM LASER TURP (TRANSURETHRAL RESECTION OF PROSTATE) N/A 04/10/2016   Procedure: THULIUM LASER TURP (TRANSURETHRAL RESECTION OF PROSTATE);  Surgeon: Hollice Espy, MD;  Location: [facility] ORS;  Service: Urology;  Laterality: N/A;  . TONSILLECTOMY      Prior to Admission medications   Medication Sig Start Date End Date Taking? Authorizing Provider  aspirin EC 81 MG tablet Take 81 mg by mouth daily.    Historical Provider, MD  cetirizine (ZYRTEC) 10 MG tablet Take 10 mg by mouth daily.    Historical Provider, MD  docusate sodium (COLACE) 100 MG capsule Take 1 capsule (100 mg total) by mouth 2 (two) times daily. 04/10/16   Hollice Espy, MD  finasteride (PROSCAR) 5 MG tablet Take 5 mg by mouth every morning.     Historical Provider, MD  fluticasone (FLONASE) 50 MCG/ACT nasal spray Place 2 sprays into both nostrils daily.    Historical Provider, MD  lisinopril (PRINIVIL,ZESTRIL) 30 MG tablet Take 30 mg by mouth every morning.     Historical Provider, MD  Multiple Vitamin (MULTIVITAMIN) tablet Take 1 tablet by mouth daily.    Historical Provider, MD  omeprazole (PRILOSEC) 40 MG capsule Take 40 mg by mouth 2 (two) times daily.    Historical Provider, MD  simvastatin (ZOCOR) 40 MG tablet Take 40 mg by mouth daily at 6 PM.  Historical Provider, MD  tamsulosin (FLOMAX) 0.4 MG CAPS capsule Take 0.4 mg by mouth daily.    Historical Provider, MD  traMADol (ULTRAM) 50 MG tablet Take 50 mg by mouth every 6 (six) hours as needed for moderate pain.  11/17/15   Historical Provider, MD  vitamin E 100 UNIT capsule Take 100 Units by mouth daily.    Historical Provider, MD    Allergies Peanut oil  Family History  Problem Relation Age of Onset  . Kidney cancer  Mother   . Lung cancer Father   . Prostate cancer Neg Hx   . Bladder Cancer Neg Hx     Social History Social History  Substance Use Topics  . Smoking status: Former Smoker    Packs/day: 1.50    Years: 20.00    Quit date: 09/25/1983  . Smokeless tobacco: Never Used  . Alcohol use No    Review of Systems  Constitutional: Negative for fever. Cardiovascular: Negative for chest pain. Respiratory: Negative for shortness of breath. Gastrointestinal: Negative for abdominal pain, vomiting and diarrhea. Genitourinary: Positive for dysuria.  Musculoskeletal: Negative for back pain. Skin: Negative for rash. Neurological: Positive for headache.  10-point ROS otherwise negative.  ____________________________________________   PHYSICAL EXAM:  VITAL SIGNS: ED Triage Vitals  Enc Vitals Group     BP 05/28/16 1817 (!) 161/110     Pulse Rate 05/28/16 1817 (!) 107     Resp 05/28/16 1817 16     Temp 05/28/16 1817 98.4 F (36.9 C)     Temp Source 05/28/16 1817 Oral     SpO2 05/28/16 1817 100 %     Weight 05/28/16 1817 233 lb (105.7 kg)     Height 05/28/16 1817 5\' 11"  (1.803 m)     Head Circumference --      Peak Flow --      Pain Score 05/28/16 1827 8    Constitutional: Alert and oriented. Well appearing and in no distress. Eyes: Conjunctivae are normal. Normal extraocular movements. ENT   Head: Normocephalic and atraumatic.   Nose: No congestion/rhinnorhea.   Mouth/Throat: Mucous membranes are moist.   Neck: No stridor. Hematological/Lymphatic/Immunilogical: No cervical lymphadenopathy. Cardiovascular: Normal rate, regular rhythm.  No murmurs, rubs, or gallops.  Respiratory: Normal respiratory effort without tachypnea nor retractions. Breath sounds are clear and equal bilaterally. No wheezes/rales/rhonchi. Gastrointestinal: Soft and non tender. No rebound. No guarding.  Genitourinary: Deferred Musculoskeletal: Normal range of motion in all extremities. No lower  extremity edema. Neurologic:  Normal speech and language. No gross focal neurologic deficits are appreciated.  Skin:  Skin is warm, dry and intact. No rash noted. Psychiatric: Mood and affect are normal. Speech and behavior are normal. Patient exhibits appropriate insight and judgment.  ____________________________________________    LABS (pertinent positives/negatives)  Labs Reviewed  CBC WITH DIFFERENTIAL/PLATELET - Abnormal; Notable for the following:       Result Value   WBC 14.0 (*)    Neutro Abs 10.8 (*)    Monocytes Absolute 1.5 (*)    All other components within normal limits  BASIC METABOLIC PANEL - Abnormal; Notable for the following:    Sodium 134 (*)    Creatinine, Ser 1.36 (*)    GFR calc non Af Amer 52 (*)    GFR calc Af Amer 60 (*)    All other components within normal limits  URINALYSIS, COMPLETE (UACMP) WITH MICROSCOPIC - Abnormal; Notable for the following:    Color, Urine YELLOW (*)    APPearance  HAZY (*)    Hgb urine dipstick MODERATE (*)    Protein, ur 100 (*)    Nitrite POSITIVE (*)    Leukocytes, UA MODERATE (*)    All other components within normal limits     ____________________________________________   EKG  None  ____________________________________________    RADIOLOGY  None  ____________________________________________   PROCEDURES  Procedures  ____________________________________________   INITIAL IMPRESSION / ASSESSMENT AND PLAN / ED COURSE  Pertinent labs & imaging results that were available during my care of the patient were reviewed by me and considered in my medical decision making (see chart for details).  Patient presented to the emergency department today with lower abdominal pain, burning. Patient had a Foley catheter placed last week. Patient urine is concerning for urinary tract infection. Will give patient has an IV antibiotics here. Will give patient prescription for further antibiotics he does follow with  [facility].  ____________________________________________   FINAL CLINICAL IMPRESSION(S) / ED DIAGNOSES  Final diagnoses:  Lower urinary tract infectious disease  Problem with Foley catheter, initial encounter [facility])     Note: This dictation was prepared with Dragon dictation. Any transcriptional errors that result from this process are unintentional     Nance Pear, MD 05/28/16 2224

## 2016-05-28 NOTE — Discharge Instructions (Signed)
Please seek medical attention for any high fevers, chest pain, shortness of breath, change in behavior, persistent vomiting, bloody stool or any other new or concerning symptoms.  

## 2016-05-29 NOTE — Telephone Encounter (Signed)
Patient's family member called the office again today.  He was back in the ER last night.  She claims he was treated for a UTI.  He is scheduled for a 6 week post op/possible cysto on 4/13.  Patient had prostate surgery on 2/21, was in the ER on 2/25, 4/1, and 4/11.  Patient's family wanted to make sure that you were aware.

## 2016-05-31 ENCOUNTER — Ambulatory Visit (INDEPENDENT_AMBULATORY_CARE_PROVIDER_SITE_OTHER): Payer: Medicare Other | Admitting: Urology

## 2016-05-31 ENCOUNTER — Encounter: Payer: Self-pay | Admitting: Urology

## 2016-05-31 VITALS — BP 163/90 | HR 75 | Ht 71.0 in | Wt 233.0 lb

## 2016-05-31 DIAGNOSIS — N138 Other obstructive and reflux uropathy: Secondary | ICD-10-CM

## 2016-05-31 DIAGNOSIS — R339 Retention of urine, unspecified: Secondary | ICD-10-CM

## 2016-05-31 DIAGNOSIS — N261 Atrophy of kidney (terminal): Secondary | ICD-10-CM

## 2016-05-31 DIAGNOSIS — N3000 Acute cystitis without hematuria: Secondary | ICD-10-CM

## 2016-05-31 DIAGNOSIS — N401 Enlarged prostate with lower urinary tract symptoms: Secondary | ICD-10-CM

## 2016-05-31 MED ORDER — PHENAZOPYRIDINE HCL 200 MG PO TABS: 200.0000 mg | ORAL_TABLET | Freq: Three times a day (TID) | ORAL | 0 refills | Status: DC | PRN

## 2016-05-31 NOTE — Progress Notes (Signed)
Catheter Removal  Patient is present today for a catheter removal.  11ml of water was drained from the balloon. A 18FR foley cath was removed from the bladder no complications were noted . Patient tolerated well.  Preformed by: Fonnie Jarvis, CMA  Continuous Intermittent Catheterization  Due to urinary retention patient is present today for a teaching of self I & O Catheterization. Patient was given detailed verbal and printed instructions of self catheterization. Patient was cleaned and prepped in a sterile fashion.  With instruction and assistance patient inserted a 14FR flex cath and urine return was noted 5 ml, urine was yellow in color. Patient tolerated well, no complications were noted Patient was given a sample bag with supplies to take home.  Instructions were given per Dr. Erlene Quan for patient to cath only if he is unable to void on his own. Patient is to follow up on Monday for a PVR  Preformed by: Fonnie Jarvis, CMA

## 2016-05-31 NOTE — Progress Notes (Signed)
05/31/2016 8:03 AM   Craig [name] 08-09-1945 [id number]  Referring provider: Tracie Harrier, MD [address] [facility] [place],  [address]  Chief Complaint  Patient presents with  . 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 His postoperative course has been complicated by possible "UTIs" and recurrent urinary retention. Recently, he was in the emergency room at which time a false pass was created requiring cystoscopic placement of the catheter by Dr. Jeffie Pollock.  He is currently on antibiotics although no urine culture was sent. All other previous urine cultures been negative. He is anxious to have his catheter removed today.  Preoperatively, he has history of baseline severe urinary symptoms, and urinary retention. He has severe daytime urgency and nocturia 3. He denies any incontinence. His urinary symptoms did improve somewhat well and antibiotics but have recurred. He's had elevated postvoid residuals in the past.  He's been on Flomax and finasteride chronically and continues to have severe urinary symptoms.  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 Surgery was uncomplicated. 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 No malignancy was identified.    PMH: Past Medical History:  Diagnosis Date  . BPH (benign prostatic hyperplasia)   . Cancer ([place])    skin cancer   . GERD (gastroesophageal reflux disease)   . History of hiatal hernia   . Hypercholesterolemia   . Hypertension     Surgical History: Past Surgical History:  Procedure Laterality Date  . IR GENERIC HISTORICAL  09/29/2015   IR NEPHROSTOMY PLACEMENT LEFT 09/29/2015 [name], MD [facility]  . IR GENERIC HISTORICAL  10/20/2015   IR NEPHROSTOGRAM LEFT THRU EXISTING ACCESS 10/20/2015 [facility]  . JOINT REPLACEMENT    . LAPAROSCOPIC NEPHRECTOMY, HAND ASSISTED Left 11/07/2015   Procedure: HAND ASSISTED LAPAROSCOPIC NEPHRECTOMY;  Surgeon: Hollice Espy, MD;  Location: [facility] ORS;  Service: Urology;  Laterality: Left;  . PROSTATE ABLATION N/A 04/10/2016   Procedure: PROSTATE ABLATION;  Surgeon: Hollice Espy, MD;  Location: [facility] ORS;  Service: Urology;  Laterality: N/A;  . REPLACEMENT TOTAL KNEE Right   . THULIUM LASER TURP (TRANSURETHRAL RESECTION OF PROSTATE) N/A 04/10/2016   Procedure: THULIUM LASER TURP (TRANSURETHRAL RESECTION OF PROSTATE);  Surgeon: Hollice Espy, MD;  Location: [facility] ORS;  Service: Urology;  Laterality: N/A;  . TONSILLECTOMY      Home Medications:  Allergies as of 05/31/2016      Reactions   Peanut Oil Other (See Comments)   Mouth ulcers Mouth ulcers      Medication List       Accurate as of 05/31/16 11:59 PM. Always use your most recent med list.          aspirin EC 81 MG tablet Take 81 mg by mouth daily.   cephALEXin 500 MG capsule Commonly known as:  KEFLEX  Take 1 capsule (500 mg total) by mouth 3 (three) times daily.   cetirizine 10 MG tablet Commonly known as:  ZYRTEC Take 10 mg by mouth daily.   docusate sodium 100 MG capsule Commonly known as:  COLACE Take 1 capsule (100 mg total) by mouth 2  (two) times daily.   finasteride 5 MG tablet Commonly known as:  PROSCAR Take 5 mg by mouth every morning.   fluticasone 50 MCG/ACT nasal spray Commonly known as:  FLONASE Place 2 sprays into both nostrils daily.   lisinopril 30 MG tablet Commonly known as:  PRINIVIL,ZESTRIL Take 30 mg by mouth every morning.   multivitamin tablet Take 1 tablet by mouth daily.   omeprazole 40 MG capsule Commonly known as:  PRILOSEC Take 40 mg by mouth 2 (two) times daily.   phenazopyridine 200 MG tablet Commonly known as:  PYRIDIUM Take 1 tablet (200 mg total) by mouth 3 (three) times daily as needed for pain.   simvastatin 40 MG tablet Commonly known as:  ZOCOR Take 40 mg by mouth daily at 6 PM.   tamsulosin 0.4 MG Caps capsule Commonly known as:  FLOMAX Take 0.4 mg by mouth daily.   traMADol 50 MG tablet Commonly known as:  ULTRAM Take 50 mg by mouth every 6 (six) hours as needed for moderate pain.   vitamin E 100 UNIT capsule Take 100 Units by mouth daily.       Allergies:  Allergies  Allergen Reactions  . Peanut Oil Other (See Comments)    Mouth ulcers Mouth ulcers    Family History: Family History  Problem Relation Age of Onset  . Kidney cancer Mother   . Lung cancer Father   . Prostate cancer Neg Hx   . Bladder Cancer Neg Hx     Social History:  reports that he quit smoking about 32 years ago. He has a 30.00 pack-year smoking history. He has never used smokeless tobacco. He reports that he does not drink alcohol or use drugs.  ROS: UROLOGY Frequent Urination?: No Hard to postpone urination?: No Burning/pain with urination?: No Get up at night to urinate?: No Leakage of urine?: No Urine stream starts and stops?: No Trouble starting stream?: No Do you have to strain to urinate?: No Blood in urine?: No Urinary tract infection?: No Sexually transmitted disease?: No Injury to kidneys or bladder?: No Painful intercourse?: No Weak stream?: No Erection  problems?: No Penile pain?: No  Gastrointestinal Nausea?: No Vomiting?: No Indigestion/heartburn?: No Diarrhea?: No Constipation?: No  Constitutional Fever: No Night sweats?: No Weight loss?: No Fatigue?: No  Skin Skin rash/lesions?: No Itching?: No  Eyes Blurred vision?: No Double vision?: No  Ears/Nose/Throat Sore throat?: No Sinus problems?: No  Hematologic/Lymphatic Swollen glands?: No Easy bruising?: No  Cardiovascular Leg swelling?: No Chest pain?: No  Respiratory Cough?: No Shortness of breath?: No  Endocrine Excessive thirst?: No  Musculoskeletal Back pain?: No Joint pain?: No  Neurological Headaches?: No Dizziness?: No  Psychologic Depression?: No Anxiety?: No  Physical Exam: BP (!) 163/90   Pulse 75   Ht 5\' 11"  (1.803 m)   Wt 233 lb (105.7 kg)   BMI 32.50 kg/m   Constitutional:  Alert and oriented, No acute distress.  Accompanied by wife today. HEENT: [place] AT, moist mucus membranes.  Trachea midline, no masses. Cardiovascular: No clubbing, cyanosis, or edema. CT Respiratory: Normal respiratory effort, no increased work of breathing.  GI: Abdomen is soft, nontender, nondistended, no abdominal masses GU: Foley  in place during clear yellow urine. Skin: No rashes, bruises or suspicious lesions. Neurologic: Grossly intact, no focal deficits, moving all 4 extremities. Psychiatric: Normal mood and affect.  Laboratory Data: Lab Results  Component Value Date   WBC 14.0 (H) 05/28/2016   HGB 14.1 05/28/2016   HCT 42.0 05/28/2016   MCV 90.7 05/28/2016   PLT 181 05/28/2016    Lab Results  Component Value Date   CREATININE 1.36 (H) 05/28/2016    Assessment & Plan:    1. 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 History of urinary retention As above  3. Cystitis UA is consistent with recent prostate surgery, all cultures of been negative  Will repeat urine culture today, complete antibiotic course was previously prescribed  4. 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  Hollice Espy, MD  [facility] [address] [place], [place] [address] [phone]

## 2016-06-02 LAB — CULTURE, URINE COMPREHENSIVE

## 2016-06-03 ENCOUNTER — Ambulatory Visit (INDEPENDENT_AMBULATORY_CARE_PROVIDER_SITE_OTHER): Payer: Medicare Other

## 2016-06-03 DIAGNOSIS — N401 Enlarged prostate with lower urinary tract symptoms: Secondary | ICD-10-CM | POA: Diagnosis not present

## 2016-06-03 DIAGNOSIS — N138 Other obstructive and reflux uropathy: Secondary | ICD-10-CM

## 2016-06-03 LAB — BLADDER SCAN AMB NON-IMAGING

## 2016-06-03 NOTE — Progress Notes (Signed)
Patient present today for a PVR post cath removal on Friday. Patient states he had no issues over the weekend he has been urinating fine and did not have to use the catheters given.  Bladder Scan Patient can void: PVR- 12 ml Performed By: Fonnie Jarvis, CMA

## 2016-06-28 ENCOUNTER — Ambulatory Visit
Admission: RE | Admit: 2016-06-28 | Discharge: 2016-06-28 | Disposition: A | Discharge status: Home / Self Care | Payer: Medicare Other | Source: Ambulatory Visit | Attending: Internal Medicine | Admitting: Internal Medicine

## 2016-06-28 ENCOUNTER — Encounter: Payer: Self-pay | Admitting: Urology

## 2016-06-28 ENCOUNTER — Encounter: Payer: Self-pay | Admitting: *Deleted

## 2016-06-28 ENCOUNTER — Encounter
Admission: RE | Disposition: A | Discharge status: Home / Self Care | Payer: Self-pay | Source: Ambulatory Visit | Attending: Internal Medicine

## 2016-06-28 ENCOUNTER — Ambulatory Visit (INDEPENDENT_AMBULATORY_CARE_PROVIDER_SITE_OTHER): Payer: Medicare Other | Admitting: Urology

## 2016-06-28 VITALS — BP 169/91 | HR 54 | Ht 71.0 in | Wt 238.0 lb

## 2016-06-28 DIAGNOSIS — N401 Enlarged prostate with lower urinary tract symptoms: Secondary | ICD-10-CM | POA: Diagnosis not present

## 2016-06-28 DIAGNOSIS — R339 Retention of urine, unspecified: Secondary | ICD-10-CM

## 2016-06-28 DIAGNOSIS — I200 Unstable angina: Secondary | ICD-10-CM

## 2016-06-28 DIAGNOSIS — N138 Other obstructive and reflux uropathy: Secondary | ICD-10-CM

## 2016-06-28 DIAGNOSIS — I25110 Atherosclerotic heart disease of native coronary artery with unstable angina pectoris: Secondary | ICD-10-CM | POA: Insufficient documentation

## 2016-06-28 DIAGNOSIS — Z87898 Personal history of other specified conditions: Secondary | ICD-10-CM | POA: Diagnosis not present

## 2016-06-28 DIAGNOSIS — N261 Atrophy of kidney (terminal): Secondary | ICD-10-CM

## 2016-06-28 DIAGNOSIS — I2584 Coronary atherosclerosis due to calcified coronary lesion: Secondary | ICD-10-CM | POA: Diagnosis not present

## 2016-06-28 HISTORY — DX: Chronic kidney disease, unspecified: N18.9

## 2016-06-28 HISTORY — PX: LEFT HEART CATH AND CORONARY ANGIOGRAPHY: CATH118249

## 2016-06-28 HISTORY — DX: Angina pectoris, unspecified: I20.9

## 2016-06-28 LAB — CARDIAC CATHETERIZATION: CATHEFQUANT: 55 %

## 2016-06-28 LAB — BLADDER SCAN AMB NON-IMAGING

## 2016-06-28 SURGERY — LEFT HEART CATH AND CORONARY ANGIOGRAPHY
Anesthesia: Moderate Sedation

## 2016-06-28 MED ORDER — SODIUM CHLORIDE 0.9 % WEIGHT BASED INFUSION
3.0000 mL/kg/h | INTRAVENOUS | Status: DC
Start: 2016-06-29 — End: 2016-06-28
  Administered 2016-06-28: 3 mL/kg/h via INTRAVENOUS

## 2016-06-28 MED ORDER — MIDAZOLAM HCL 2 MG/2ML IJ SOLN
INTRAMUSCULAR | Status: DC | PRN
  Administered 2016-06-28 (×2): 1 mg via INTRAVENOUS

## 2016-06-28 MED ORDER — FENTANYL CITRATE (PF) 100 MCG/2ML IJ SOLN
INTRAMUSCULAR | Status: AC
  Filled 2016-06-28: qty 2

## 2016-06-28 MED ORDER — SODIUM CHLORIDE 0.9% FLUSH: 3.0000 mL | Freq: Two times a day (BID) | INTRAVENOUS | Status: DC

## 2016-06-28 MED ORDER — SODIUM CHLORIDE 0.9 % IV SOLN: 250.0000 mL | INTRAVENOUS | Status: DC | PRN

## 2016-06-28 MED ORDER — SODIUM CHLORIDE 0.9 % WEIGHT BASED INFUSION
1.0000 mL/kg/h | INTRAVENOUS | Status: DC
  Administered 2016-06-28: 1 mL/kg/h via INTRAVENOUS

## 2016-06-28 MED ORDER — SODIUM CHLORIDE 0.9% FLUSH: 3.0000 mL | INTRAVENOUS | Status: DC | PRN

## 2016-06-28 MED ORDER — ONDANSETRON HCL 4 MG/2ML IJ SOLN: 4.0000 mg | Freq: Four times a day (QID) | INTRAMUSCULAR | Status: DC | PRN

## 2016-06-28 MED ORDER — ACETAMINOPHEN 325 MG PO TABS: 650.0000 mg | ORAL_TABLET | ORAL | Status: DC | PRN

## 2016-06-28 MED ORDER — HEPARIN (PORCINE) IN NACL 2-0.9 UNIT/ML-% IJ SOLN
INTRAMUSCULAR | Status: AC
  Filled 2016-06-28: qty 1000

## 2016-06-28 MED ORDER — MIDAZOLAM HCL 2 MG/2ML IJ SOLN
INTRAMUSCULAR | Status: AC
  Filled 2016-06-28: qty 2

## 2016-06-28 MED ORDER — SODIUM CHLORIDE 0.9 % WEIGHT BASED INFUSION: 1.0000 mL/kg/h | INTRAVENOUS | Status: DC

## 2016-06-28 MED ORDER — IOPAMIDOL (ISOVUE-300) INJECTION 61%
INTRAVENOUS | Status: DC | PRN
  Administered 2016-06-28: 110 mL via INTRAVENOUS

## 2016-06-28 MED ORDER — ASPIRIN 81 MG PO CHEW: 81.0000 mg | CHEWABLE_TABLET | ORAL | Status: DC

## 2016-06-28 MED ORDER — FENTANYL CITRATE (PF) 100 MCG/2ML IJ SOLN
INTRAMUSCULAR | Status: DC | PRN
  Administered 2016-06-28 (×2): 25 ug via INTRAVENOUS

## 2016-06-28 SURGICAL SUPPLY — 9 items
CATH 5FR JL4 DIAGNOSTIC (CATHETERS) ×3 IMPLANT
CATH INFINITI 5FR ANG PIGTAIL (CATHETERS) ×3 IMPLANT
CATH INFINITI JR4 5F (CATHETERS) ×3 IMPLANT
DEVICE CLOSURE MYNXGRIP 5F (Vascular Products) ×3 IMPLANT
GUIDEWIRE 3MM J TIP .035 145 (WIRE) ×3 IMPLANT
KIT MANI 3VAL PERCEP (MISCELLANEOUS) ×3 IMPLANT
NEEDLE PERC 18GX7CM (NEEDLE) ×3 IMPLANT
PACK CARDIAC CATH (CUSTOM PROCEDURE TRAY) ×3 IMPLANT
SHEATH AVANTI 5FR X 11CM (SHEATH) ×3 IMPLANT

## 2016-06-28 NOTE — Progress Notes (Signed)
06/28/2016 7:18 PM   Craig [name] 30-Apr-1945 [id number]  Referring provider: Tracie Harrier, MD [address] [facility] [place], [place] [address]  Chief Complaint  Patient presents with  . 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   His postoperative course has been complicated by possible "UTIs" and recurrent urinary retention. Recently, he was in the emergency room at which time a false pass was created requiring cystoscopic placement of the catheter by Dr. Jeffie Pollock.   Postop, he's had urinary symptoms including urinary frequency, urgency, nocturia stent 5 which was his baseline prior to surgery. He also had postoperative urinary retention which is now resolved. He was taught how to clean intermittent catheterization last visit but has not needed to do so.    No dysuria or gross hematuria. Overall, he thinks that things are improving slowly.   He remains on both finasteride and Flomax.    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 Surgery was uncomplicated. 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 No malignancy was identified.   He is scheduled today following this visit for cardiac catheterization. He reports that he is having chest pain that radiates down his arm intermittently.         IPSS    Row Name 06/28/16 1000         International Prostate Symptom Score   How often have you had the sensation of not emptying your bladder? Less than 1 in 5     How often have you had to urinate less than every two hours? Almost always     How often have you found you stopped and started again several times when you urinated? About half the time     How often have you found it difficult to postpone urination? About half the time     How often have you had a weak urinary stream? Less than 1 in 5 times     How often have you had to strain to start urination? Less than 1 in 5 times     How many times did you typically get up at night to urinate? 5 Times     Total IPSS Score 19       Quality of Life due to urinary symptoms   If you were to spend the rest of your life with your urinary condition just the way it is now how would you feel about that? Mixed        Score:  1-7 Mild 8-19 Moderate 20-35 Severe   PMH: Past Medical History:  Diagnosis Date  . Anginal pain ([place])   . BPH (benign prostatic hyperplasia)   . Cancer (Home)    skin cancer   . Chronic kidney disease   . GERD (gastroesophageal reflux disease)   . History of hiatal hernia   . Hypercholesterolemia   . Hypertension     Surgical History: Past Surgical History:  Procedure Laterality Date  . IR GENERIC HISTORICAL  09/29/2015   IR NEPHROSTOMY PLACEMENT LEFT 09/29/2015 [name], MD [facility]  . IR GENERIC HISTORICAL  10/20/2015   IR NEPHROSTOGRAM LEFT THRU EXISTING ACCESS 10/20/2015 [facility]  . JOINT REPLACEMENT    . LAPAROSCOPIC NEPHRECTOMY, HAND ASSISTED Left 11/07/2015   Procedure: HAND ASSISTED LAPAROSCOPIC NEPHRECTOMY;  Surgeon: Hollice Espy, MD;  Location: [facility];  Service: Urology;  Laterality: Left;  . LEFT HEART CATH AND CORONARY ANGIOGRAPHY N/A 06/28/2016   Procedure: Left Heart Cath and Coronary Angiography;  Surgeon: Yolonda Kida, MD;  Location: [facility];  Service: Cardiovascular;  Laterality: N/A;  . PROSTATE ABLATION N/A 04/10/2016   Procedure: PROSTATE ABLATION;  Surgeon: Hollice Espy, MD;  Location: [facility];  Service: Urology;  Laterality: N/A;  . REPLACEMENT TOTAL KNEE Right   . THULIUM LASER TURP  (TRANSURETHRAL RESECTION OF PROSTATE) N/A 04/10/2016   Procedure: THULIUM LASER TURP (TRANSURETHRAL RESECTION OF PROSTATE);  Surgeon: Hollice Espy, MD;  Location: [facility];  Service: Urology;  Laterality: N/A;  . TONSILLECTOMY      Home Medications:  Allergies as of 06/28/2016      Reactions   Peanut Oil Other (See Comments)   Mouth ulcers   Peanut-containing Drug Products Other (See Comments)   Mouth ulcers       Medication List       Accurate as of 06/28/16 11:03 AM. Always use your most recent med list.          aspirin EC 81 MG tablet Take 81 mg by mouth daily.   cetirizine 10 MG tablet Commonly known as:  ZYRTEC Take 10 mg by mouth daily.   docusate sodium 100 MG capsule Commonly known as:  COLACE Take 1 capsule (100 mg total) by mouth 2 (two) times daily.   fluticasone 50 MCG/ACT nasal spray Commonly known as:  FLONASE Place 2 sprays into both nostrils daily.   isosorbide mononitrate 30 MG 24 hr tablet Commonly known as:  IMDUR   lisinopril 30 MG tablet Commonly known as:  PRINIVIL,ZESTRIL Take 30 mg by mouth every morning.   metoprolol succinate 25 MG 24 hr tablet Commonly known as:  TOPROL-XL   multivitamin tablet Take 1 tablet by mouth daily.   omeprazole 40 MG capsule Commonly known as:  PRILOSEC Take 40 mg by mouth 2 (two) times daily.   simvastatin 40 MG tablet Commonly known as:  ZOCOR Take 40 mg by mouth daily at 6 PM.   tamsulosin 0.4 MG Caps capsule Commonly known as:  FLOMAX Take 0.4 mg by mouth daily.   tiZANidine 2 MG tablet Commonly known as:  ZANAFLEX every 8 (eight) hours as needed.   traMADol 50 MG tablet Commonly known as:  ULTRAM Take 50 mg by mouth every 6 (six) hours as needed for moderate pain.   vitamin E 100 UNIT capsule Take 100 Units by mouth daily.       Allergies:  Allergies  Allergen Reactions  . Peanut Oil Other (See Comments)    Mouth ulcers   . Peanut-Containing Drug Products Other (See Comments)     Mouth ulcers     Family History: Family History  Problem Relation Age of Onset  . Kidney cancer Mother   . Lung cancer Father   . Prostate cancer Neg Hx   . Bladder Cancer Neg Hx     Social History:  reports that he quit smoking about 32 years ago. He has a 30.00 pack-year smoking history. He has never used smokeless tobacco. He reports that he does not drink alcohol or use drugs.  ROS: UROLOGY Frequent Urination?: No Hard to postpone urination?: Yes Burning/pain with urination?: No Get up at  night to urinate?: Yes Leakage of urine?: Yes Urine stream starts and stops?: No Trouble starting stream?: No Do you have to strain to urinate?: No Blood in urine?: No Urinary tract infection?: No Sexually transmitted disease?: No Injury to kidneys or bladder?: No Painful intercourse?: No Weak stream?: No Erection problems?: No Penile pain?: No  Gastrointestinal Nausea?: No Vomiting?: No Indigestion/heartburn?: No Diarrhea?: No Constipation?: No  Constitutional Fever: No Night sweats?: No Weight loss?: No Fatigue?: No  Skin Skin rash/lesions?: No Itching?: No  Eyes Blurred vision?: No Double vision?: No  Ears/Nose/Throat Sore throat?: No Sinus problems?: No  Hematologic/Lymphatic Swollen glands?: No Easy bruising?: No  Cardiovascular Leg swelling?: No Chest pain?: No  Respiratory Cough?: No Shortness of breath?: No  Endocrine Excessive thirst?: No  Musculoskeletal Back pain?: No Joint pain?: No  Neurological Headaches?: No Dizziness?: No  Psychologic Depression?: No Anxiety?: No  Physical Exam: BP (!) 169/91   Pulse (!) 54   Ht 5\' 11"  (1.803 m)   Wt 238 lb (108 kg)   BMI 33.19 kg/m   Constitutional:  Alert and oriented, No acute distress.  Accompanied by wife today. HEENT: [place] AT, moist mucus membranes.  Trachea midline, no masses. Cardiovascular: No clubbing, cyanosis, or edema.  Respiratory: Normal respiratory effort, no increased  work of breathing.  GI: Abdomen is soft, nontender, nondistended, no abdominal masses Skin: No rashes, bruises or suspicious lesions. Neurologic: Grossly intact, no focal deficits, moving all 4 extremities. Psychiatric: Normal mood and affect.  Laboratory Data: Lab Results  Component Value Date   WBC 14.0 (H) 05/28/2016   HGB 14.1 05/28/2016   HCT 42.0 05/28/2016   MCV 90.7 05/28/2016   PLT 181 05/28/2016    Lab Results  Component Value Date   CREATININE 1.36 (H) 05/28/2016   Results for orders placed or performed in visit on 06/28/16  BLADDER SCAN AMB NON-IMAGING  Result Value Ref Range   Scan Result 24ml      Assessment & Plan:    1. BPH with obstruction/lower urinary tract symptoms s/p laser ablation of the prostate on 04/10/2016 Overall slightly improved today Recommend possible urodynamics in the future if urinary symptoms fail to improve- reassess in 3 months following heart evaluation Interested in stopping some of his BPH meds, advised to stop finasteride at this time and continue Flomax  2. History of urinary retention As above  4. Left renal atrophy Secondary to chronic UPJ obstruction S/p left nephrectomy 9/17  Return in about 3 months (around 09/28/2016) for PVR, bladder scan.  Hollice Espy, MD  [facility] [address], [place], [place] [address] [address] [phone]

## 2016-06-28 NOTE — Discharge Instructions (Signed)
Groin Insertion Instructions-If you lose feeling or develop tingling or pain in your leg or foot after the procedure, please walk around first.  If the discomfort does not improve , contact your physician and proceed to the nearest emergency room.  Loss of feeling in your leg might mean that a blockage has formed in the artery and this can be appropriately treated.  Limit your activity for the next two days after your procedure.  Avoid stooping, bending, heavy lifting or exertion as this may put pressure on the insertion site.  Resume normal activities in 48 hours.  You may shower after 24 hours but avoid excessive warm water and do not scrub the site.  Remove clear dressing in 48 hours.  If you have had a closure device inserted, do not soak in a tub bath or a hot tub for at least one week. ° °No driving for 48 hours after discharge.  After the procedure, check the insertion site occasionally.  If any oozing occurs or there is apparent swelling, firm pressure over the site will prevent a bruise from forming.  You can not hurt anything by pressing directly on the site.  The pressure stops the bleeding by allowing a small clot to form.  If the bleeding continues after the pressure has been applied for more than 15 minutes, call 911 or go to the nearest emergency room.   ° °The x-ray dye causes you to pass a considerate amount of urine.  For this reason, you will be asked to drink plenty of liquids after the procedure to prevent dehydration.  You may resume you regular diet.  Avoid caffeine products.   ° °For pain at the site of your procedure, take non-aspirin medicines such as Tylenol. ° °Medications: A. Hold Metformin for 48 hours if applicable.  B. Continue taking all your present medications at home unless your doctor prescribes any changes. ° °Moderate Conscious Sedation, Adult, Care After °These instructions provide you with information about caring for yourself after your procedure. Your health care provider  may also give you more specific instructions. Your treatment has been planned according to current medical practices, but problems sometimes occur. Call your health care provider if you have any problems or questions after your procedure. °What can I expect after the procedure? °After your procedure, it is common: °· To feel sleepy for several hours. °· To feel clumsy and have poor balance for several hours. °· To have poor judgment for several hours. °· To vomit if you eat too soon. °Follow these instructions at home: °For at least 24 hours after the procedure:  ° °· Do not: °¨ Participate in activities where you could fall or become injured. °¨ Drive. °¨ Use heavy machinery. °¨ Drink alcohol. °¨ Take sleeping pills or medicines that cause drowsiness. °¨ Make important decisions or sign legal documents. °¨ Take care of children on your own. °· Rest. °Eating and drinking  °· Follow the diet recommended by your health care provider. °· If you vomit: °¨ Drink water, juice, or soup when you can drink without vomiting. °¨ Make sure you have little or no nausea before eating solid foods. °General instructions  °· Have a responsible adult stay with you until you are awake and alert. °· Take over-the-counter and prescription medicines only as told by your health care provider. °· If you smoke, do not smoke without supervision. °· Keep all follow-up visits as told by your health care provider. This is important. °Contact a health   care provider if: °· You keep feeling nauseous or you keep vomiting. °· You feel light-headed. °· You develop a rash. °· You have a fever. °Get help right away if: °· You have trouble breathing. °This information is not intended to replace advice given to you by your health care provider. Make sure you discuss any questions you have with your health care provider. °Document Released: 11/25/2012 Document Revised: 07/10/2015 Document Reviewed: 05/27/2015 °Elsevier Interactive Patient Education © 2017  [employer] ° °

## 2016-06-30 ENCOUNTER — Encounter: Payer: Self-pay | Admitting: Urology

## 2016-07-01 ENCOUNTER — Encounter: Payer: Self-pay | Admitting: Internal Medicine

## 2016-07-01 DIAGNOSIS — J449 Chronic obstructive pulmonary disease, unspecified: Secondary | ICD-10-CM | POA: Insufficient documentation

## 2016-07-01 DIAGNOSIS — K222 Esophageal obstruction: Secondary | ICD-10-CM | POA: Insufficient documentation

## 2016-07-02 DIAGNOSIS — I2510 Atherosclerotic heart disease of native coronary artery without angina pectoris: Secondary | ICD-10-CM | POA: Insufficient documentation

## 2016-07-03 DIAGNOSIS — D62 Acute posthemorrhagic anemia: Secondary | ICD-10-CM | POA: Insufficient documentation

## 2016-07-03 DIAGNOSIS — Z951 Presence of aortocoronary bypass graft: Secondary | ICD-10-CM | POA: Insufficient documentation

## 2016-07-03 DIAGNOSIS — Z9079 Acquired absence of other genital organ(s): Secondary | ICD-10-CM | POA: Insufficient documentation

## 2016-07-03 DIAGNOSIS — N365 Urethral false passage: Secondary | ICD-10-CM | POA: Insufficient documentation

## 2016-07-06 DIAGNOSIS — I639 Cerebral infarction, unspecified: Secondary | ICD-10-CM | POA: Insufficient documentation

## 2016-07-08 DIAGNOSIS — D638 Anemia in other chronic diseases classified elsewhere: Secondary | ICD-10-CM | POA: Insufficient documentation

## 2016-07-08 DIAGNOSIS — R790 Abnormal level of blood mineral: Secondary | ICD-10-CM | POA: Insufficient documentation

## 2016-07-24 ENCOUNTER — Encounter: Payer: Self-pay | Admitting: Urology

## 2016-08-06 ENCOUNTER — Ambulatory Visit: Payer: Medicare Other | Attending: Physical Medicine and Rehabilitation

## 2016-08-06 DIAGNOSIS — R279 Unspecified lack of coordination: Secondary | ICD-10-CM

## 2016-08-06 DIAGNOSIS — M6281 Muscle weakness (generalized): Secondary | ICD-10-CM | POA: Diagnosis present

## 2016-08-06 DIAGNOSIS — R2689 Other abnormalities of gait and mobility: Secondary | ICD-10-CM

## 2016-08-06 NOTE — Therapy (Signed)
[place] MAIN [facility] SERVICES [address] [place], [place], [address] Phone: [phone]   Fax:  [phone]  Physical Therapy Evaluation  Patient Details  Name: Craig [name] MRN: [id number] Date of Birth: 08/11/1945 Referring Provider: Dr. Brooke Bonito  Encounter Date: 08/06/2016      PT End of Session - 08/06/16 1115    Visit Number 1   Number of Visits 12   Date for PT Re-Evaluation 09/17/16   Authorization - Visit Number 1   Authorization - Number of Visits 10   PT Start Time 0900   PT Stop Time 0959   PT Time Calculation (min) 59 min   Equipment Utilized During Treatment Gait belt;Left knee immobilizer   Activity Tolerance Patient tolerated treatment well;Treatment limited secondary to medical complications (Comment)   Behavior During Therapy [facility] for tasks assessed/performed;Impulsive      Past Medical History:  Diagnosis Date  . Anginal pain ([place])   . BPH (benign prostatic hyperplasia)   . Cancer ([place])    skin cancer   . Chronic kidney disease   . GERD (gastroesophageal reflux disease)   . History of hiatal hernia   . Hypercholesterolemia   . Hypertension     Past Surgical History:  Procedure Laterality Date  . IR GENERIC HISTORICAL  09/29/2015   IR NEPHROSTOMY PLACEMENT LEFT 09/29/2015 [name], MD [facility]-INTERV RAD  . IR GENERIC HISTORICAL  10/20/2015   IR NEPHROSTOGRAM LEFT THRU EXISTING ACCESS 10/20/2015 [facility]-INTERV RAD  . JOINT REPLACEMENT    . LAPAROSCOPIC NEPHRECTOMY, HAND ASSISTED Left 11/07/2015   Procedure: HAND ASSISTED LAPAROSCOPIC NEPHRECTOMY;  Surgeon: Hollice Espy, MD;  Location: [facility] ORS;  Service: Urology;  Laterality: Left;  . LEFT HEART CATH AND CORONARY ANGIOGRAPHY N/A 06/28/2016   Procedure: Left Heart Cath and Coronary Angiography;  Surgeon: Yolonda Kida, MD;  Location: [place] CV LAB;  Service: Cardiovascular;  Laterality: N/A;  . PROSTATE ABLATION N/A 04/10/2016   Procedure: PROSTATE  ABLATION;  Surgeon: Hollice Espy, MD;  Location: [facility] ORS;  Service: Urology;  Laterality: N/A;  . REPLACEMENT TOTAL KNEE Right   . THULIUM LASER TURP (TRANSURETHRAL RESECTION OF PROSTATE) N/A 04/10/2016   Procedure: THULIUM LASER TURP (TRANSURETHRAL RESECTION OF PROSTATE);  Surgeon: Hollice Espy, MD;  Location: [facility] ORS;  Service: Urology;  Laterality: N/A;  . TONSILLECTOMY      There were no vitals filed for this visit.       Subjective Assessment - 08/06/16 0910    Subjective Pt. is a pleasant 71 year old male who presents to physical therapy s/p CVA.    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 Pt. wife reports it took approximately 14 hours to do a stroke assessment due to the medications and became past the 24 hour time for treatment once detected.   Main problem per wife is attention span and vision issue. Left knee needs a replacement so is wearing a replacement. 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 Pt. was independent prior to bypass surgery in May.    Limitations Lifting;Reading;Standing;Walking;Writing;House hold activities   How long can you sit comfortably? 30 minutes   How long can you stand comfortably? 15 minutes   How long can you walk comfortably?  500   Patient Stated Goals get back to previous level of function, mowing the yard, ambulating without assistive device   Currently in Pain? No/denies            [facility] PT Assessment - 08/06/16 0001      Assessment   Medical Diagnosis CVA   Referring Provider Dr. Brooke Bonito   Onset Date/Surgical Date 07/02/16   Hand Dominance Right   Next MD Visit surgeon tomorrow, cardiologist thursday   Prior Therapy yes     Precautions   Precautions Fall;Sternal   Required  Braces or Orthoses Knee Immobilizer - Left  moveable   Knee Immobilizer - Left On when out of bed or walking     Restrictions   Weight Bearing Restrictions No     Balance Screen   Has the patient fallen in the past 6 months No   Has the patient had a decrease in activity level because of a fear of falling?  Yes   Is the patient reluctant to leave their home because of a fear of falling?  Yes     [place] residence   Available Help at Discharge Family   Type of [place] to enter   Entrance Stairs-Number of Steps 3   Entrance Stairs-Rails Right   [place] One level  5 stairs going out of office   [place] - 2 wheels;Bedside commode;Shower seat;Grab bars - tub/shower     Prior Function   Level of Independence Independent   Vocation Retired  but owns [employer] and so does paperwork,   Leisure work, going to [employer],      Charity fundraiser Status Within Functional Limits for tasks assessed     Standardized Balance Assessment   Standardized Balance Assessment Medical illustrator   Sit to Stand Able to stand without using hands and stabilize independently   Standing Unsupported Able to stand safely 2 minutes   Sitting with Back Unsupported but Feet Supported on Floor or Stool Able to sit safely and securely 2 minutes   Stand to Sit Sits safely with minimal use of hands   Transfers Able to transfer safely, minor use of hands   Standing Unsupported with Eyes Closed Able to stand 10 seconds safely   Standing Ubsupported with Feet Together Able to place feet together independently and stand 1 minute safely   From Standing, Reach Forward with Outstretched Arm Can reach confidently >25 cm (10")   From Standing Position, Pick up Object from Floor Unable to try/needs assist to keep balance   From Standing Position, Turn to Look Behind Over each Shoulder Looks behind one  side only/other side shows less weight shift   Turn 360 Degrees Able to turn 360 degrees safely but slowly   Standing Unsupported, Alternately Place Feet on Step/Stool Able to complete >2 steps/needs minimal assist   Standing Unsupported, One Foot in Front Able to place foot tandem independently and hold 30 seconds   Standing on One Leg Tries to lift leg/unable to hold 3 seconds but remains standing independently   Total Score 43    PAIN: None at this time  POSTURE: Rounded shoulders, forward head   PROM/AROM: UE: slightly limited, did not apply overpressure due to sternal precautions LE: limited hip extension  STRENGTH:  Graded on a 0-5 scale Muscle Group Left Right  Hip Flex 4-/5  5/5  Hip Abd 4/5 5/5  Hip Add 4/5 5/5  Hip Ext 4/5 5/5  Hip IR/ER 4/5 5/5  Knee Flex 4+/5 5/5  Knee Ext 4+/5 5/5  Ankle DF 4+/5 5/5  Ankle PF 4/5 5/5   SENSATION: normal   FUNCTIONAL MOBILITY: 10MWT: 16 seconds STS: supervision/CGA Ambulation: CGA with rear walker SPT: supervision/CGA  BALANCE: BERG=43/56 ABC= 25.63% GAIT: Decreased L knee flexion and extension due to brace. Utilized RW and had step to pattern with LLE with foot flat contact.  OUTCOME MEASURES: TEST Outcome Interpretation  5 times sit<>stand 35 sec >60 yo, >15 sec indicates increased risk for falls  10 meter walk test             .625    m/s <1.0 m/s indicates increased risk for falls; limited community ambulator  [employer] Assessment 43/56 <36/56 (100% risk for falls), 37-45 (80% risk for falls); 46-51 (>50% risk for falls); 52-55 (lower risk <25% of falls)      Pt. response to medical necessity:  Patient will benefit from skilled physical therapy services to increase strength, mobility, and balance to improve gait mechanics and return to prior level of function.    TherEx Ankle pumps 20x Seated marching 20x Sit to stands w/o UE assistance 5x rhomberg with support surface 20 seconds    Objective  measurements completed on examination: See above findings.         Plan - 08/06/16 1116    Clinical Impression Statement Patient is a pleasant 71 year old male who presents to therapy s/p CVA and triple bipass surgery. Pt. Presents with left sided involvement with concurrent chronic L knee pain. Sternal and falls precaution are in place at this time. Pt. Demonstrated ability to perform sit to stands without utilizing UE's. 5xSTS=35 seconds, 10MWT .625 m/s, BERG 43/56, ABC=25.63%. Patient utilizes left knee brace for existing L knee pain. UE ROM slightly limited but overpressure was not applied due to sternal precautions. Patient and wife were educated on sternal precautions and fall precautions with both verbalizing understanding. Patient will benefit from skilled physical therapy services to increase strength, mobility, and balance to improve gait mechanics and return to prior level of function.    History and Personal Factors relevant to plan of care: HTN, R knee replacement, hyperlipidemia, BPH, s/p nephrectomy, COPD, GERD, hiatal hernia, schatzki's ring, coronary artery bypass, CVA, anemia,   Clinical Presentation Evolving   Clinical Presentation due to: This patient presents with 3, personal factors/ comorbidities 4  body elements including body structures and functions, activity limitations and or participation restrictions. Patient's condition is evolving   Clinical Decision Making Moderate   Rehab Potential Fair   Clinical Impairments Affecting Rehab Potential chest precautions, hiatial hernia, CVA   PT Frequency 2x / week   PT Duration 6 weeks   PT Treatment/Interventions ADLs/Self Care Home Management;Cryotherapy;Parrafin;Ultrasound;Traction;Moist Heat;Iontophoresis 4mg /ml Dexamethasone;Electrical Stimulation;DME Instruction;Gait training;Stair training;Functional mobility training;Neuromuscular re-education;Balance training;Therapeutic exercise;Therapeutic activities;Patient/family  education;Cognitive remediation;Orthotic Fit/Training;Compression bandaging;Manual techniques;Wheelchair mobility training;Passive range of motion;Dry needling;Energy conservation;Splinting;Taping;Visual/perceptual remediation/compensation;Vestibular   PT Next Visit Plan review HEP, stairs   PT Home Exercise Plan see sheet   Consulted and Agree with Plan of Care Patient;Family member/caregiver   Family Member Consulted wife      Patient will benefit from skilled therapeutic intervention in order to improve the following deficits and impairments:  Abnormal gait, Cardiopulmonary status limiting activity, Decreased activity tolerance, Decreased balance, Decreased endurance, Decreased knowledge of precautions, Decreased knowledge of use of DME, Decreased safety  awareness, Decreased range of motion, Decreased mobility, Decreased strength, Difficulty walking, Dizziness, Hypomobility, Impaired flexibility, Impaired perceived functional ability, Impaired UE functional use, Postural dysfunction, Improper body mechanics, Pain  Visit Diagnosis: Other abnormalities of gait and mobility  Muscle weakness (generalized)  Unspecified lack of coordination      G-Codes - 12-Aug-2016 1230    Functional Assessment Tool Used (Outpatient Only) ABC, BERG, 10MWT, 5xSTS, Gait assessment, MMT, clinical judgement   Functional Limitation Mobility: Walking and moving around   Mobility: Walking and Moving Around Current Status [phone]) At least 80 percent but less than 100 percent impaired, limited or restricted   Mobility: Walking and Moving Around Goal Status [phone]) At least 20 percent but less than 40 percent impaired, limited or restricted       Problem List Patient Active Problem List   Diagnosis Date Noted  . Hydronephrosis, left 11/07/2015  . Sepsis ([place]) 09/29/2015  . Acute pyelonephritis 09/25/2015  . Frequency of urination 05/29/2015  . BPH (benign prostatic hyperplasia) 09/05/2014  . Hyperlipidemia  09/05/2014  . Enlarged prostate with urinary obstruction 07/03/2014  . History of total knee replacement 06/02/2014  . Status post total right knee replacement 06/02/2014  . Osteoarthritis of knee 05/12/2014  . HTN (hypertension) 06/28/2013  . Hypertension 06/28/2013   Janna Arch, PT, DPT   08/12/2016, 12:32 PM  [place] MAIN [facility] SERVICES [address] [place], [place], [address] Phone: [phone]   Fax:  [phone]  Name: Craig [name] MRN: [id number] Date of Birth: 1946/02/08

## 2016-08-12 ENCOUNTER — Ambulatory Visit: Payer: Medicare Other

## 2016-08-12 DIAGNOSIS — M6281 Muscle weakness (generalized): Secondary | ICD-10-CM

## 2016-08-12 DIAGNOSIS — R2689 Other abnormalities of gait and mobility: Secondary | ICD-10-CM | POA: Diagnosis not present

## 2016-08-12 DIAGNOSIS — R279 Unspecified lack of coordination: Secondary | ICD-10-CM

## 2016-08-12 NOTE — Therapy (Signed)
[place] MAIN [facility] SERVICES [address] [place], [place], [address] Phone: [phone]   Fax:  [phone]  Physical Therapy Treatment  Patient Details  Name: Craig [name] MRN: [id number] Date of Birth: 01-10-1946 Referring Provider: Dr. Brooke Bonito  Encounter Date: 08/12/2016      PT End of Session - 08/12/16 1214    Visit Number 2   Number of Visits 12   Date for PT Re-Evaluation 09/17/16   Authorization - Visit Number 2   Authorization - Number of Visits 10   PT Start Time 1030   PT Stop Time 1115   PT Time Calculation (min) 45 min   Equipment Utilized During Treatment Gait belt;Left knee immobilizer   Activity Tolerance Patient tolerated treatment well;Treatment limited secondary to medical complications (Comment)   Behavior During Therapy [facility] for tasks assessed/performed;Impulsive      Past Medical History:  Diagnosis Date  . Anginal pain ([place])   . BPH (benign prostatic hyperplasia)   . Cancer ([place])    skin cancer   . Chronic kidney disease   . GERD (gastroesophageal reflux disease)   . History of hiatal hernia   . Hypercholesterolemia   . Hypertension     Past Surgical History:  Procedure Laterality Date  . IR GENERIC HISTORICAL  09/29/2015   IR NEPHROSTOMY PLACEMENT LEFT 09/29/2015 [name], MD [facility]-INTERV RAD  . IR GENERIC HISTORICAL  10/20/2015   IR NEPHROSTOGRAM LEFT THRU EXISTING ACCESS 10/20/2015 [facility]-INTERV RAD  . JOINT REPLACEMENT    . LAPAROSCOPIC NEPHRECTOMY, HAND ASSISTED Left 11/07/2015   Procedure: HAND ASSISTED LAPAROSCOPIC NEPHRECTOMY;  Surgeon: Hollice Espy, MD;  Location: [facility] ORS;  Service: Urology;  Laterality: Left;  . LEFT HEART CATH AND CORONARY ANGIOGRAPHY N/A 06/28/2016   Procedure: Left Heart Cath and Coronary Angiography;  Surgeon: Yolonda Kida, MD;  Location: [place] CV LAB;  Service: Cardiovascular;  Laterality: N/A;  . PROSTATE ABLATION N/A 04/10/2016   Procedure: PROSTATE  ABLATION;  Surgeon: Hollice Espy, MD;  Location: [facility] ORS;  Service: Urology;  Laterality: N/A;  . REPLACEMENT TOTAL KNEE Right   . THULIUM LASER TURP (TRANSURETHRAL RESECTION OF PROSTATE) N/A 04/10/2016   Procedure: THULIUM LASER TURP (TRANSURETHRAL RESECTION OF PROSTATE);  Surgeon: Hollice Espy, MD;  Location: [facility] ORS;  Service: Urology;  Laterality: N/A;  . TONSILLECTOMY      There were no vitals filed for this visit.      Subjective Assessment - 08/12/16 1035    Subjective Pt. wife reports that pt. was released from sternal precautions. No stumbles or falls. Pt. has not been using his walker within the home.    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 Pt. wife reports it took approximately 14 hours to do a stroke assessment due to the medications and became past the 24 hour time for treatment once detected.   Main problem per wife is attention span and vision issue. Left knee needs a replacement so is wearing a replacement. 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 Pt. was independent prior to bypass surgery in May.    Limitations Lifting;Reading;Standing;Walking;Writing;House hold activities   How long can you sit comfortably? 30 minutes   How long can you stand comfortably? 15 minutes  How long can you walk comfortably? 500   Patient Stated Goals get back to previous level of function, mowing the yard, ambulating without assistive device   Currently in Pain? Yes   Pain Score 2    Pain Location Knee   Pain Orientation Left    TherEx Ankle pumps,  seated marches20x sit to stand Standing marches 2 finger support to decrease trunk movement.  Stairs, ascend: lead with right step to pattern with right hand rail, descend lead with  left, right hand rail step to pattern Standing hip extension in // bars 10x each leg, increased trunk flexion Side stepping in RTB in // bars 10x  Standing hamstring curls Heel raises to toe raises 20x in // bars    Neuro Re-ed Large step hold 60 seconds each leg, some shakeyness of legs Eyes closed shoulder width apart stance no LOB Airex pad: standing balance 30 sec no wobble, eyes closed 30 sec some wobble Airex pad reaching for balls at increasing distance and putting on different colored rails Tandem stance Airex pads, 60 seconds x 4, tandem stance with horizontal head nods Tandem walking in // bars 4x  Bosu ball balance 60 seconds with single UE support Bosu ball lunges 10x each leg   Pt. response to medical necessity:  Patient will continue to benefit from skilled physical therapy to increase strength, mobility, and balance to improve gait mechanics and return to prior level of function.          PT Long Term Goals - 08/06/16 1219      PT LONG TERM GOAL #1   Title Patient (> 22 years old) will complete five times sit to stand test in < 15 seconds indicating an increased LE strength and improved balance.   Baseline 6/19: 35 seconds   Time 6   Period Weeks   Status New     PT LONG TERM GOAL #2   Title Patient will increase Berg Balance score by > 6 points (49/56) to demonstrate decreased fall risk during functional activities.   Baseline 6/19: 43/56   Time 6   Period Weeks   Status New     PT LONG TERM GOAL #3   Title Patient will increase 10 meter walk test to >1.1m/s as to improve gait speed for better community ambulation and to reduce fall risk.   Baseline 6/19: .625 with rear walker and CGA   Time 6   Period Weeks   Status New     PT LONG TERM GOAL #4   Title Patient will increase ABC scale score >80% to demonstrate better functional mobility and better confidence with ADLs.    Baseline 6/19: 25.63%   Time 6   Period Weeks   Status New                Plan - 08/12/16 1217    Clinical Impression Statement Pt. Is no longer on sternal precautions at this time. Balance is limited with weaker LLE contributing to challenging dynamic balance and dynamic surface balance. Strengthening exercises performed in standing with occasional breaks required due to L knee pain. Patient will continue to benefit from skilled physical therapy to increase strength, mobility, and balance to improve gait mechanics and return to prior level of function.    Rehab Potential Fair   Clinical Impairments Affecting Rehab Potential chest precautions, hiatial hernia, CVA   PT Frequency 2x / week   PT Duration 6 weeks   PT Treatment/Interventions ADLs/Self Care Home  Management;Cryotherapy;Parrafin;Ultrasound;Traction;Moist Heat;Iontophoresis 4mg /ml Dexamethasone;Electrical Stimulation;DME Instruction;Gait training;Stair training;Functional mobility training;Neuromuscular re-education;Balance training;Therapeutic exercise;Therapeutic activities;Patient/family education;Cognitive remediation;Orthotic Fit/Training;Compression bandaging;Manual techniques;Wheelchair mobility training;Passive range of motion;Dry needling;Energy conservation;Splinting;Taping;Visual/perceptual remediation/compensation;Vestibular   PT Next Visit Plan stairs, standing balance/ex, bike   PT Home Exercise Plan see sheet   Consulted and Agree with Plan of Care Patient;Family member/caregiver   Family Member Consulted wife      Patient will benefit from skilled therapeutic intervention in order to improve the following deficits and impairments:  Abnormal gait, Cardiopulmonary status limiting activity, Decreased activity tolerance, Decreased balance, Decreased endurance, Decreased knowledge of precautions, Decreased knowledge of use of DME, Decreased safety awareness, Decreased range of motion, Decreased mobility, Decreased strength, Difficulty walking, Dizziness, Hypomobility, Impaired  flexibility, Impaired perceived functional ability, Impaired UE functional use, Postural dysfunction, Improper body mechanics, Pain  Visit Diagnosis: Other abnormalities of gait and mobility  Muscle weakness (generalized)  Unspecified lack of coordination     Problem List Patient Active Problem List   Diagnosis Date Noted  . Hydronephrosis, left 11/07/2015  . Sepsis ([place]) 09/29/2015  . Acute pyelonephritis 09/25/2015  . Frequency of urination 05/29/2015  . BPH (benign prostatic hyperplasia) 09/05/2014  . Hyperlipidemia 09/05/2014  . Enlarged prostate with urinary obstruction 07/03/2014  . History of total knee replacement 06/02/2014  . Status post total right knee replacement 06/02/2014  . Osteoarthritis of knee 05/12/2014  . HTN (hypertension) 06/28/2013  . Hypertension 06/28/2013   Janna Arch, PT, DPT   08/12/2016, 12:19 PM  [place] MAIN [facility] SERVICES [address] [place], [place], [address] Phone: [phone]   Fax:  [phone]  Name: Craig [name] MRN: [id number] Date of Birth: 06-12-1945

## 2016-08-14 ENCOUNTER — Ambulatory Visit: Payer: Medicare Other

## 2016-08-15 ENCOUNTER — Ambulatory Visit: Payer: Medicare Other

## 2016-08-15 DIAGNOSIS — R2689 Other abnormalities of gait and mobility: Secondary | ICD-10-CM

## 2016-08-15 DIAGNOSIS — M6281 Muscle weakness (generalized): Secondary | ICD-10-CM

## 2016-08-15 DIAGNOSIS — R279 Unspecified lack of coordination: Secondary | ICD-10-CM

## 2016-08-15 NOTE — Therapy (Signed)
[place] [facility] SERVICES [address] [place], [place], [address] Phone: [phone]   Fax:  [phone]  Physical Therapy Treatment  Patient Details  Name: Craig [name] MRN: [id number] Date of Birth: 03-11-1945 Referring Provider: Dr. Brooke Bonito  Encounter Date: 08/15/2016      PT End of Session - 08/15/16 1154    Visit Number 3   Number of Visits 12   Date for PT Re-Evaluation 09/17/16   Authorization - Visit Number 3   Authorization - Number of Visits 10   PT Start Time 1100   PT Stop Time 1146   PT Time Calculation (min) 46 min   Equipment Utilized During Treatment Gait belt   Activity Tolerance Patient tolerated treatment well;Treatment limited secondary to medical complications (Comment)   Behavior During Therapy [facility] for tasks assessed/performed;Impulsive      Past Medical History:  Diagnosis Date  . Anginal pain ([place])   . BPH (benign prostatic hyperplasia)   . Cancer ([place])    skin cancer   . Chronic kidney disease   . GERD (gastroesophageal reflux disease)   . History of hiatal hernia   . Hypercholesterolemia   . Hypertension     Past Surgical History:  Procedure Laterality Date  . IR GENERIC HISTORICAL  09/29/2015   IR NEPHROSTOMY PLACEMENT LEFT 09/29/2015 [name], MD [facility]INTER[facility] RAD  . IR GENERIC HISTORICAL  10/20/2015   IR NEPHROSTOGRAM LEFT THRU EXISTING ACCESS 10/20/2015 [facility]  . JOINT REPLACEMENT    . LAPAROSCOPIC NEPHRECTOMY, HAND ASSISTED Left 11/07/2015   Procedure: HAND ASSISTED LAPAROSCOPIC NEPHRECTOMY;  Surgeon: Hollice Espy, MD;  Location: [facility] ORS;  Service: Urology;  Laterality: Left;  . LEFT HEART CATH AND CORONARY ANGIOGRAPHY N/A 06/28/2016   Procedure: Left Heart Cath and Coronary Angiography;  Surgeon: Yolonda Kida, MD;  Location: [place] CV [facility];  Service: Cardiovascular;  Laterality: N/A;  . PROSTATE ABLATION N/A 04/10/2016   Procedure: PROSTATE ABLATION;  Surgeon:  Hollice Espy, MD;  Location: [facility] OR[facility];  Service: Urology;  Laterality: N/A;  . REPLACEMENT TOTAL KNEE Right   . THULIUM LASER TURP (TRANSURETHRAL RESECTION OF PROSTATE) N/A 04/10/2016   Procedure: THULIUM LASER TURP (TRANSURETHRAL RESECTION OF PROSTATE);  Surgeon: Hollice Espy, MD;  Location: [facility] ORS;  Service: Urology;  Laterality: N/A;  . TONSILLECTOMY      There were no vitals filed for this visit.      Subjective Assessment - 08/15/16 1105    Subjective Pt. has been going to the office more this week since its billing time.  Pt wife will be getting surgery in july.    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 Pt. wife reports it took approximately 14 hours to do a stroke assessment due to the medications and became past the 24 hour time for treatment once detected.   Main problem per wife is attention span and vision issue. Left knee needs a replacement so is wearing a replacement. 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 Pt. was independent prior to bypass surgery in May.    Limitations Lifting;Reading;Standing;Walking;Writing;House hold activities   How long can you sit comfortably? 30 minutes   How long can you stand comfortably? 15 minutes   How  long can you walk comfortably? 500   Patient Stated Goals get back to previous level of function, mowing the yard, ambulating without assistive device   Currently in Pain? Yes   Pain Score 2    Pain Location Knee   Pain Orientation Left      TherEx Ankle pumps, sit to stand 2x5 from higher plinth no UE assist, 2x5 from lower plinth no UE assist Stairs,6x ascend: lead with right step to pattern with right hand rail, descend lead with left, right hand rail step to pattern Standing hip  extension in // bars 10x each leg, increased trunk flexion Side stepping w GTB in // bars 10x length of // bars. Cues for keeping toes forward Heel raises to toe raises 20x in // bars step ups at stairs 12x BUE assist  Neuro Re-ed Eccentric step downs in // bars, difficult with left leg. 10x Large step hold 60 seconds each leg, some shakeyness of legs Airex pad: standing balance 30 sec no wobble, eyes closed 30 sec some wobble Tandem stance Airex pads, 60 seconds x 4, tandem stance with horizontal head nods, horizontal head nods  2x60  Tandem stance: 60 seconds each leg, very few LOB;  Tandem walking in // bars 4x  Bosu ball balance 60 seconds with single UE support-painful airex balance beam side steps 8x length of // bars airex balane beam tandem walk 8x length of bars Reciprocal arm leg 10x in a row, performed 3x seated, 1x standing  Pt. response to medical necessity:  Patient will continue to benefit from skilled physical therapy to increase strength, mobility, and balance to improve gait mechanics and return to prior level of function.           PT Long Term Goals - 08/06/16 1219      PT LONG TERM GOAL #1   Title Patient (> 70 years old) will complete five times sit to stand test in < 15 seconds indicating an increased LE strength and improved balance.   Baseline 6/19: 35 seconds   Time 6   Period Weeks   Status New     PT LONG TERM GOAL #2   Title Patient will increase Berg Balance score by > 6 points (49/56) to demonstrate decreased fall risk during functional activities.   Baseline 6/19: 43/56   Time 6   Period Weeks   Status New     PT LONG TERM GOAL #3   Title Patient will increase 10 meter walk test to >1.49m/s as to improve gait speed for better community ambulation and to reduce fall risk.   Baseline 6/19: .625 with rear walker and CGA   Time 6   Period Weeks   Status New     PT LONG TERM GOAL #4   Title Patient will increase ABC scale score >80% to  demonstrate better functional mobility and better confidence with ADLs.    Baseline 6/19: 25.63%   Time 6   Period Weeks   Status New               Plan - 08/15/16 1155    Clinical Impression Statement Patient demonstrated improved tandem stance balance indicating HEP compliance.  L knee pain impacting stair negotiation and eccentric step downs implemented to increase LLE strength for improved performance. Reciprical motions difficult for pt. To perform but improved with repetition and given as HEP.  Patient will continue to benefit from skilled physical therapy to increase strength, mobility, and balance to improve  gait mechanics and return to prior level of function.    Rehab Potential Fair   Clinical Impairments Affecting Rehab Potential chest precautions, hiatial hernia, CVA   PT Frequency 2x / week   PT Duration 6 weeks   PT Treatment/Interventions ADLs/Self Care Home Management;Cryotherapy;Parrafin;Ultrasound;Traction;Moist Heat;Iontophoresis 4mg /ml Dexamethasone;Electrical Stimulation;DME Instruction;Gait training;Stair training;Functional mobility training;Neuromuscular re-education;Balance training;Therapeutic exercise;Therapeutic activities;Patient/family education;Cognitive remediation;Orthotic Fit/Training;Compression bandaging;Manual techniques;Wheelchair mobility training;Passive range of motion;Dry needling;Energy conservation;Splinting;Taping;Visual/perceptual remediation/compensation;Vestibular   PT Next Visit Plan stairs, standing balance/ex, bike   PT Home Exercise Plan see sheet   Consulted and Agree with Plan of Care Patient;Family member/caregiver   Family Member Consulted wife      Patient will benefit from skilled therapeutic intervention in order to improve the following deficits and impairments:  Abnormal gait, Cardiopulmonary status limiting activity, Decreased activity tolerance, Decreased balance, Decreased endurance, Decreased knowledge of precautions,  Decreased knowledge of use of DME, Decreased safety awareness, Decreased range of motion, Decreased mobility, Decreased strength, Difficulty walking, Dizziness, Hypomobility, Impaired flexibility, Impaired perceived functional ability, Impaired UE functional use, Postural dysfunction, Improper body mechanics, Pain  Visit Diagnosis: Other abnormalities of gait and mobility  Muscle weakness (generalized)  Unspecified lack of coordination     Problem List Patient Active Problem List   Diagnosis Date Noted  . Hydronephrosis, left 11/07/2015  . Sepsis ([place]) 09/29/2015  . Acute pyelonephritis 09/25/2015  . Frequency of urination 05/29/2015  . BPH (benign prostatic hyperplasia) 09/05/2014  . Hyperlipidemia 09/05/2014  . Enlarged prostate with urinary obstruction 07/03/2014  . History of total knee replacement 06/02/2014  . Status post total right knee replacement 06/02/2014  . Osteoarthritis of knee 05/12/2014  . HTN (hypertension) 06/28/2013  . Hypertension 06/28/2013  Janna Arch, PT, DPT   08/15/2016, 11:57 AM  [place] MAIN [facility] SERVICES [address] [place], [place], [address] Phone: [phone]   Fax:  [phone]  Name: HERSH MINNEY MRN: [id number] Date of Birth: 1945/06/26

## 2016-08-19 ENCOUNTER — Ambulatory Visit: Payer: Medicare Other | Attending: Physical Medicine and Rehabilitation

## 2016-08-19 DIAGNOSIS — R2689 Other abnormalities of gait and mobility: Secondary | ICD-10-CM

## 2016-08-19 DIAGNOSIS — R279 Unspecified lack of coordination: Secondary | ICD-10-CM

## 2016-08-19 DIAGNOSIS — M6281 Muscle weakness (generalized): Secondary | ICD-10-CM

## 2016-08-19 NOTE — Therapy (Signed)
[place] MAIN [facility] SERVICES [address] [place], [place], [address] Phone: [phone]   Fax:  [phone]  Physical Therapy Treatment  Patient Details  Name: Craig [name] MRN: [id number] Date of Birth: 1945-06-26 Referring Provider: Dr. Brooke Bonito  Encounter Date: 08/19/2016      PT End of Session - 08/19/16 1813    Visit Number 4   Number of Visits 12   Date for PT Re-Evaluation 09/17/16   Authorization - Visit Number 4   Authorization - Number of Visits 10   PT Start Time 1301   PT Stop Time 1345   PT Time Calculation (min) 44 min   Equipment Utilized During Treatment Gait belt   Activity Tolerance Patient tolerated treatment well;Treatment limited secondary to medical complications (Comment)   Behavior During Therapy [facility] for tasks assessed/performed;Impulsive      Past Medical History:  Diagnosis Date  . Anginal pain ([place])   . BPH (benign prostatic hyperplasia)   . Cancer ([place])    skin cancer   . Chronic kidney disease   . GERD (gastroesophageal reflux disease)   . History of hiatal hernia   . Hypercholesterolemia   . Hypertension     Past Surgical History:  Procedure Laterality Date  . IR GENERIC HISTORICAL  09/29/2015   IR NEPHROSTOMY PLACEMENT LEFT 09/29/2015 [name], MD [facility]  . IR GENERIC HISTORICAL  10/20/2015   IR NEPHROSTOGRAM LEFT THRU EXISTING ACCESS 10/20/2015 [facility]  . JOINT REPLACEMENT    . LAPAROSCOPIC NEPHRECTOMY, HAND ASSISTED Left 11/07/2015   Procedure: HAND ASSISTED LAPAROSCOPIC NEPHRECTOMY;  Surgeon: Hollice Espy, MD;  Location: [facility] ORS;  Service: Urology;  Laterality: Left;  . LEFT HEART CATH AND CORONARY ANGIOGRAPHY N/A 06/28/2016   Procedure: Left Heart Cath and Coronary Angiography;  Surgeon: Yolonda Kida, MD;  Location: [place] CV LAB;  Service: Cardiovascular;  Laterality: N/A;  . PROSTATE ABLATION N/A 04/10/2016   Procedure: PROSTATE ABLATION;  Surgeon:  Hollice Espy, MD;  Location: [facility] ORS;  Service: Urology;  Laterality: N/A;  . REPLACEMENT TOTAL KNEE Right   . THULIUM LASER TURP (TRANSURETHRAL RESECTION OF PROSTATE) N/A 04/10/2016   Procedure: THULIUM LASER TURP (TRANSURETHRAL RESECTION OF PROSTATE);  Surgeon: Hollice Espy, MD;  Location: [facility] OR[facility];  Service: Urology;  Laterality: N/A;  . TONSILLECTOMY      There were no vitals filed for this visit. 129/76: pulse 54, sp02 100% TherEx     Subjective Assessment - 08/19/16 1304    Subjective Pt. is meeting neurlogist on the 10th. Having on swelling in ankle and foot on left .   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 Pt. wife reports it took approximately 14 hours to do a stroke assessment due to the medications and became past the 24 hour time for treatment once detected.   Main problem per wife is attention span and vision issue. Left knee needs a replacement so is wearing a replacement. 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 Pt. was independent prior to bypass surgery in May.    Limitations Lifting;Reading;Standing;Walking;Writing;House hold activities   How long can you sit comfortably? 30 minutes   How long can you stand comfortably? 15 minutes   How long can  you walk comfortably? 500   Patient Stated Goals get back to previous level of function, mowing the yard, ambulating without assistive device   Currently in Pain? Yes   Pain Score 2    Pain Location Knee   Pain Orientation Left     Quad cane training 23ft, quad cane training: cane, left right, in // bars 10x until safe enough to ambulate in circles around gym.   Neuro Re-ed Ambulating across dynamic mat 8x no hands Ambulating across dynamic mat and stepping over half foam  roller 6x Stepping over two consecutive half foam rollers 6x  Eccentric step downs in // bars, difficult with left leg. 10x Large step hold 60 seconds each leg, some shakeyness of legs Airex pad: standing balance 30 sec no wobble, eyes closed 30 sec some wobble Tandem stance Airex pads, 60 seconds x 4, tandem stance with horizontal head nods, horizontal head nods  2x60  Tandem stance: 60 seconds each leg, very few LOB;  Tandem walking in // bars 4x  Reciprocal arm leg 10x in a row, performed 3x seated, 1x standing   Pt. response to medical necessity:  Patient will continue to benefit from skilled physical therapy to increase strength, mobility, and balance to improve gait mechanics and return to prior level of function.   L: midcalf 40 cm  Bimalleolar: 26.5 R: midcalf: 39.5  Bimalleloar: 25 Pitting edema left side Warmth of of left calf            PT Long Term Goals - 08/06/16 1219      PT LONG TERM GOAL #1   Title Patient (> 72 years old) will complete five times sit to stand test in < 15 seconds indicating an increased LE strength and improved balance.   Baseline 6/19: 35 seconds   Time 6   Period Weeks   Status New     PT LONG TERM GOAL #2   Title Patient will increase Berg Balance score by > 6 points (49/56) to demonstrate decreased fall risk during functional activities.   Baseline 6/19: 43/56   Time 6   Period Weeks   Status New     PT LONG TERM GOAL #3   Title Patient will increase 10 meter walk test to >1.57m/s as to improve gait speed for better community ambulation and to reduce fall risk.   Baseline 6/19: .625 with rear walker and CGA   Time 6   Period Weeks   Status New     PT LONG TERM GOAL #4   Title Patient will increase ABC scale score >80% to demonstrate better functional mobility and better confidence with ADLs.    Baseline 6/19: 25.63%   Time 6   Period Weeks   Status New               Plan - 08/19/16 1814    Clinical Impression  Statement Patient presents to therapy today with increased swelling and heat in LLE and pt. And wife were educated on signs and symptoms of DVT. Requested wife call doctor to update on recent change of symptoms. Progression of ambulation from walker to quad cane initiated this session.  Patient will continue to benefit from skilled physical therapy to increase strength, mobility, and balance to improve gait mechanics and return to prior level of function.    Rehab Potential Fair   Clinical Impairments Affecting Rehab Potential chest precautions, hiatial hernia, CVA   PT Frequency 2x / week   PT Duration  6 weeks   PT Treatment/Interventions ADLs/Self Care Home Management;Cryotherapy;Parrafin;Ultrasound;Traction;Moist Heat;Iontophoresis 4mg /ml Dexamethasone;Electrical Stimulation;DME Instruction;Gait training;Stair training;Functional mobility training;Neuromuscular re-education;Balance training;Therapeutic exercise;Therapeutic activities;Patient/family education;Cognitive remediation;Orthotic Fit/Training;Compression bandaging;Manual techniques;Wheelchair mobility training;Passive range of motion;Dry needling;Energy conservation;Splinting;Taping;Visual/perceptual remediation/compensation;Vestibular   PT Next Visit Plan quad cane uneven surfaces   PT Home Exercise Plan see sheet   Consulted and Agree with Plan of Care Patient;Family member/caregiver   Family Member Consulted wife      Patient will benefit from skilled therapeutic intervention in order to improve the following deficits and impairments:  Abnormal gait, Cardiopulmonary status limiting activity, Decreased activity tolerance, Decreased balance, Decreased endurance, Decreased knowledge of precautions, Decreased knowledge of use of DME, Decreased safety awareness, Decreased range of motion, Decreased mobility, Decreased strength, Difficulty walking, Dizziness, Hypomobility, Impaired flexibility, Impaired perceived functional ability, Impaired UE  functional use, Postural dysfunction, Improper body mechanics, Pain  Visit Diagnosis: Other abnormalities of gait and mobility  Muscle weakness (generalized)  Unspecified lack of coordination     Problem List Patient Active Problem List   Diagnosis Date Noted  . Hydronephrosis, left 11/07/2015  . Sepsis ([place]) 09/29/2015  . Acute pyelonephritis 09/25/2015  . Frequency of urination 05/29/2015  . BPH (benign prostatic hyperplasia) 09/05/2014  . Hyperlipidemia 09/05/2014  . Enlarged prostate with urinary obstruction 07/03/2014  . History of total knee replacement 06/02/2014  . Status post total right knee replacement 06/02/2014  . Osteoarthritis of knee 05/12/2014  . HTN (hypertension) 06/28/2013  . Hypertension 06/28/2013   Janna Arch, PT, DPT   Janna Arch 08/19/2016, 6:15 PM  [place] MAIN [facility] SERVICES [address] [place], [place], [address] Phone: [phone]   Fax:  [phone]  Name: Craig [name] MRN: [id number] Date of Birth: 1945/10/22

## 2016-08-22 ENCOUNTER — Ambulatory Visit: Payer: Medicare Other

## 2016-08-22 DIAGNOSIS — R2689 Other abnormalities of gait and mobility: Secondary | ICD-10-CM

## 2016-08-22 DIAGNOSIS — M6281 Muscle weakness (generalized): Secondary | ICD-10-CM

## 2016-08-22 DIAGNOSIS — R279 Unspecified lack of coordination: Secondary | ICD-10-CM

## 2016-08-22 NOTE — Therapy (Signed)
[place] MAIN [facility] SERVICES [address] [place], [place], [address] Phone: [phone]   Fax:  [phone]  Physical Therapy Treatment  Patient Details  Name: Craig [name] MRN: [id number] Date of Birth: [birth year] Referring Provider: Dr. Brooke Bonito  Encounter Date: 08/22/2016      PT End of Session - 08/22/16 1247    Visit Number 5   Number of Visits 12   Date for PT Re-Evaluation 09/17/16   Authorization - Visit Number 5   Authorization - Number of Visits 10   PT Start Time 0900   PT Stop Time 0946   PT Time Calculation (min) 46 min   Equipment Utilized During Treatment Gait belt   Activity Tolerance Patient tolerated treatment well   Behavior During Therapy [facility] for tasks assessed/performed;Impulsive      Past Medical History:  Diagnosis Date  . Anginal pain ([place])   . BPH (benign prostatic hyperplasia)   . Cancer ([place])    skin cancer   . Chronic kidney disease   . GERD (gastroesophageal reflux disease)   . History of hiatal hernia   . Hypercholesterolemia   . Hypertension     Past Surgical History:  Procedure Laterality Date  . IR GENERIC HISTORICAL  09/29/2015   IR NEPHROSTOMY PLACEMENT LEFT 09/29/2015 [name], MD [facility]-INTERV RAD  . IR GENERIC HISTORICAL  10/20/2015   IR NEPHROSTOGRAM LEFT THRU EXISTING ACCESS 10/20/2015 [facility]-INTERV RAD  . JOINT REPLACEMENT    . LAPAROSCOPIC NEPHRECTOMY, HAND ASSISTED Left 11/07/2015   Procedure: HAND ASSISTED LAPAROSCOPIC NEPHRECTOMY;  Surgeon: Hollice Espy, MD;  Location: [facility] ORS;  Service: Urology;  Laterality: Left;  . LEFT HEART CATH AND CORONARY ANGIOGRAPHY N/A 06/28/2016   Procedure: Left Heart Cath and Coronary Angiography;  Surgeon: Yolonda Kida, MD;  Location: [place] CV LAB;  Service: Cardiovascular;  Laterality: N/A;  . PROSTATE ABLATION N/A 04/10/2016   Procedure: PROSTATE ABLATION;  Surgeon: Hollice Espy, MD;  Location: [facility] ORS;  Service: Urology;   Laterality: N/A;  . REPLACEMENT TOTAL KNEE Right   . THULIUM LASER TURP (TRANSURETHRAL RESECTION OF PROSTATE) N/A 04/10/2016   Procedure: THULIUM LASER TURP (TRANSURETHRAL RESECTION OF PROSTATE);  Surgeon: Hollice Espy, MD;  Location: [facility] ORS;  Service: Urology;  Laterality: N/A;  . TONSILLECTOMY      There were no vitals filed for this visit. Gait mechanics Quad cane training 56ft, quad cane training: cane, left right, in // bars 10x until safe enough to ambulate in circles around gym. Quad cane uneven surfaces  Ambulating in // bars with quad cane 10x with verbal cues for gait pattern Ambulating with quad cane across dynamic mat 10x with verbal cues and CGA Ambulating over obstacles in // bars with quad cane and verbal cues Ambulating over half foam roller and weaving between 4 cones with quad cane and CGA 4x.   Neuro Re-ed  Eccentric step downs in // bars, difficult with left leg. 10x Large step hold 60 seconds each leg, some shakeyness of legs Airex pad: standing balance 30 sec no wobble, eyes closed 30 sec, vertical head nods 30 sec, horizontal head nods  30 seconds Tandem stance Airex pads, 60 seconds x 4, tandem stance with horizontal head nods, horizontal head nods  2x60  Tandem stance: 60 seconds each leg, very few LOB;  Tandem walking in // bars 4x  Airex balance beam tandem walk 8x,  Airex balance beam side step 6x Airex balance beam balance sideways 30  seconds Reciprocal arm leg 10x in a row, performed 3x seated, 1x standing   Pt. response to medical necessity:  Patient will continue to benefit from skilled physical therapy to increase strength, mobility, and balance to improve gait mechanics and return to prior level of function.  L: midcalf 39             Bimalleolar: 26  Pitting edema left side, less rebound time.  Warmth of of left calf     Subjective Assessment - 08/22/16 0907    Subjective pt. 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 Pt. wife reports it took approximately 14 hours to do a stroke assessment due to the medications and became past the 24 hour time for treatment once detected.   Main problem per wife is attention span and vision issue. Left knee needs a replacement so is wearing a replacement. 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 Pt. was independent prior to bypass surgery in May.    Limitations Lifting;Reading;Standing;Walking;Writing;House hold activities   How long can you sit comfortably? 30 minutes   How long can you stand comfortably? 15 minutes   How long can you walk comfortably? 500   Patient Stated Goals get back to previous level of function, mowing the yard, ambulating without assistive device   Currently in Pain? Yes   Pain Score 2    Pain Location Knee   Pain Orientation Left   Pain Descriptors / Indicators Aching             PT Long Term Goals - 08/06/16 1219      PT LONG TERM GOAL #1   Title Patient (> 31 years old) will complete five times sit to stand test in < 15 seconds indicating an increased LE strength and improved balance.   Baseline 6/19: 35 seconds   Time 6   Period Weeks   Status New     PT LONG TERM GOAL #2   Title Patient will increase Berg Balance score by > 6 points (49/56) to demonstrate decreased fall risk during functional activities.   Baseline 6/19: 43/56   Time 6   Period Weeks   Status New     PT LONG TERM GOAL #3   Title Patient will increase 10 meter walk test to >1.44m/s as to improve gait speed for better community ambulation and to reduce fall risk.   Baseline 6/19: .625 with rear walker and CGA   Time 6   Period Weeks   Status New     PT LONG TERM GOAL #4    Title Patient will increase ABC scale score >80% to demonstrate better functional mobility and better confidence with ADLs.    Baseline 6/19: 25.63%   Time 6   Period Weeks   Status New               Plan - 08/22/16 1249    Clinical Impression Statement Patient able to progress to ambulating with quad cane on stable and unstable surfaces. Swelling in LLE has decreased with less edema. Balance is progressing with pt. Requiring less cues.Patient will continue to benefit from skilled physical therapy to increase strength, mobility, and  balance to improve gait mechanics and return to prior level of function    Rehab Potential Fair   Clinical Impairments Affecting Rehab Potential chest precautions, hiatial hernia, CVA   PT Frequency 2x / week   PT Duration 6 weeks   PT Treatment/Interventions ADLs/Self Care Home Management;Cryotherapy;Parrafin;Ultrasound;Traction;Moist Heat;Iontophoresis 4mg /ml Dexamethasone;Electrical Stimulation;DME Instruction;Gait training;Stair training;Functional mobility training;Neuromuscular re-education;Balance training;Therapeutic exercise;Therapeutic activities;Patient/family education;Cognitive remediation;Orthotic Fit/Training;Compression bandaging;Manual techniques;Wheelchair mobility training;Passive range of motion;Dry needling;Energy conservation;Splinting;Taping;Visual/perceptual remediation/compensation;Vestibular   PT Next Visit Plan quad cane uneven surfaces   PT Home Exercise Plan see sheet   Consulted and Agree with Plan of Care Patient;Family member/caregiver   Family Member Consulted wife      Patient will benefit from skilled therapeutic intervention in order to improve the following deficits and impairments:  Abnormal gait, Cardiopulmonary status limiting activity, Decreased activity tolerance, Decreased balance, Decreased endurance, Decreased knowledge of precautions, Decreased knowledge of use of DME, Decreased safety awareness, Decreased range  of motion, Decreased mobility, Decreased strength, Difficulty walking, Dizziness, Hypomobility, Impaired flexibility, Impaired perceived functional ability, Impaired UE functional use, Postural dysfunction, Improper body mechanics, Pain  Visit Diagnosis: Other abnormalities of gait and mobility  Muscle weakness (generalized)  Unspecified lack of coordination     Problem List Patient Active Problem List   Diagnosis Date Noted  . Hydronephrosis, left 11/07/2015  . Sepsis ([place]) 09/29/2015  . Acute pyelonephritis 09/25/2015  . Frequency of urination 05/29/2015  . BPH (benign prostatic hyperplasia) 09/05/2014  . Hyperlipidemia 09/05/2014  . Enlarged prostate with urinary obstruction 07/03/2014  . History of total knee replacement 06/02/2014  . Status post total right knee replacement 06/02/2014  . Osteoarthritis of knee 05/12/2014  . HTN (hypertension) 06/28/2013  . Hypertension 06/28/2013   Janna Arch, PT, DPT   Janna Arch 08/22/2016, 12:50 PM  [place] MAIN [facility] SERVICES [address] [place], [place], [address] Phone: [phone]   Fax:  [phone]  Name: Craig [name] MRN: [id number] Date of Birth: January 30, 1946

## 2016-08-26 ENCOUNTER — Ambulatory Visit: Payer: Medicare Other

## 2016-08-26 DIAGNOSIS — R279 Unspecified lack of coordination: Secondary | ICD-10-CM

## 2016-08-26 DIAGNOSIS — R2689 Other abnormalities of gait and mobility: Secondary | ICD-10-CM

## 2016-08-26 DIAGNOSIS — M6281 Muscle weakness (generalized): Secondary | ICD-10-CM

## 2016-08-26 NOTE — Therapy (Signed)
[place] MAIN [facility] SERVICES [address] [place], [place], [address] Phone: [phone]   Fax:  [phone]  Physical Therapy Treatment  Patient Details  Name: Craig [name] MRN: [id number] Date of Birth: 23-Dec-1945 Referring Provider: Dr. Brooke Bonito  Encounter Date: 08/26/2016      PT End of Session - 08/26/16 1445    Visit Number 6   Number of Visits 12   Date for PT Re-Evaluation 09/17/16   Authorization - Visit Number 6   Authorization - Number of Visits 10   PT Start Time 1300   PT Stop Time 1345   PT Time Calculation (min) 45 min   Equipment Utilized During Treatment Gait belt   Activity Tolerance Patient tolerated treatment well   Behavior During Therapy [facility] for tasks assessed/performed;Impulsive      Past Medical History:  Diagnosis Date  . Anginal pain ([place])   . BPH (benign prostatic hyperplasia)   . Cancer ([place])    skin cancer   . Chronic kidney disease   . GERD (gastroesophageal reflux disease)   . History of hiatal hernia   . Hypercholesterolemia   . Hypertension     Past Surgical History:  Procedure Laterality Date  . IR GENERIC HISTORICAL  09/29/2015   IR NEPHROSTOMY PLACEMENT LEFT 09/29/2015 [name], MD [facility]INTERV RAD  . IR GENERIC HISTORICAL  10/20/2015   IR NEPHROSTOGRAM LEFT THRU EXISTING ACCESS 10/20/2015 [facility]INTERV RAD  . JOINT REPLACEMENT    . LAPAROSCOPIC NEPHRECTOMY, HAND ASSISTED Left 11/07/2015   Procedure: HAND ASSISTED LAPAROSCOPIC NEPHRECTOMY;  Surgeon: Hollice Espy, MD;  Location: [facility] ORS;  Service: Urology;  Laterality: Left;  . LEFT HEART CATH AND CORONARY ANGIOGRAPHY N/A 06/28/2016   Procedure: Left Heart Cath and Coronary Angiography;  Surgeon: Yolonda Kida, MD;  Location: [place] CV LAB;  Service: Cardiovascular;  Laterality: N/A;  . PROSTATE ABLATION N/A 04/10/2016   Procedure: PROSTATE ABLATION;  Surgeon: Hollice Espy, MD;  Location: [facility] ORS;  Service: Urology;   Laterality: N/A;  . REPLACEMENT TOTAL KNEE Right   . THULIUM LASER TURP (TRANSURETHRAL RESECTION OF PROSTATE) N/A 04/10/2016   Procedure: THULIUM LASER TURP (TRANSURETHRAL RESECTION OF PROSTATE);  Surgeon: Hollice Espy, MD;  Location: [facility] ORS;  Service: Urology;  Laterality: N/A;  . TONSILLECTOMY      There were no vitals filed for this visit.      Subjective Assessment - 08/26/16 1302    Subjective Pt. went to ortho and got cortison shot. Hasn't had brace on since friday evening.    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 Pt. wife reports it took approximately 14 hours to do a stroke assessment due to the medications and became past the 24 hour time for treatment once detected.   Main problem per wife is attention span and vision issue. Left knee needs a replacement so is wearing a replacement. 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 Pt. was independent prior to bypass surgery in May.    Limitations Lifting;Reading;Standing;Walking;Writing;House hold activities   How long can you sit comfortably? 30 minutes   How long can you stand comfortably? 15 minutes   How long can you walk comfortably? 500   Patient Stated Goals get back to  previous level of function, mowing the yard, ambulating without assistive device   Currently in Pain? No/denies     Gait Walking across grass 2x50 ft with QC, CGA, occasional verbal cues for cane placement, step length, upright posture Quad cane ambulating 600 ft x 2, cues for step length, upright posture, acceptance of weight via tactile and verbal cueing.   Neuro Re-ed  Quad cane stairs step to pattern with R railing 4x Airex pad: standing balance 30 sec no wobble, eyes closed 30 sec, vertical head nods 30  sec, horizontal head nods  30 seconds Bosu ball: requires single UE assist bosu ball lunges with bue support 20x  Tandem stance Airex pads, 60 seconds x 5, tandem stance with horizontal head nods and vertical head nods  2x60  Tandem walking in // bars 4x  Airex balance beam tandem walk 10x, decreasing UE assistance Airex balance beam side step 10x decreasing UE assistance Airex balance beam balance sideways 30 seconds Reciprocal arm leg 10x in a row, performed 3x seated, 1x standing, switch every 3-4 improved with repetition    Pt. response to medical necessity:  Patient will continue to benefit from, skilled physical therapy to increase strength, mobility, and balance to improve gait mechanics and return to prior level of function.         PT Long Term Goals - 08/06/16 1219      PT LONG TERM GOAL #1   Title Patient (> 28 years old) will complete five times sit to stand test in < 15 seconds indicating an increased LE strength and improved balance.   Baseline 6/19: 35 seconds   Time 6   Period Weeks   Status New     PT LONG TERM GOAL #2   Title Patient will increase Berg Balance score by > 6 points (49/56) to demonstrate decreased fall risk during functional activities.   Baseline 6/19: 43/56   Time 6   Period Weeks   Status New     PT LONG TERM GOAL #3   Title Patient will increase 10 meter walk test to >1.79m/s as to improve gait speed for better community ambulation and to reduce fall risk.   Baseline 6/19: .625 with rear walker and CGA   Time 6   Period Weeks   Status New     PT LONG TERM GOAL #4   Title Patient will increase ABC scale score >80% to demonstrate better functional mobility and better confidence with ADLs.    Baseline 6/19: 25.63%   Time 6   Period Weeks   Status New               Plan - 08/26/16 1446    Clinical Impression Statement  Patient no longer utilizes knee brace and has improved gait mechanics secondary to cortizone shot. Ambulation  across dynamic surfaces required gentle cueing for gait mechanics however was performed in a safe and functional manner. Pt. Continues to progress with dynamic balance and is progressing towards more challenging interventions.  Patient will continue to benefit from, skilled physical therapy to increase strength, mobility, and balance to improve gait mechanics and return to prior level of function   Rehab Potential Fair   Clinical Impairments Affecting Rehab Potential chest precautions, hiatial hernia, CVA   PT Frequency 2x / week   PT Duration 6 weeks   PT Treatment/Interventions ADLs/Self Care Home Management;Cryotherapy;Parrafin;Ultrasound;Traction;Moist Heat;Iontophoresis 4mg /ml Dexamethasone;Electrical Stimulation;DME Instruction;Gait training;Stair training;Functional mobility training;Neuromuscular re-education;Balance training;Therapeutic exercise;Therapeutic activities;Patient/family education;Cognitive remediation;Orthotic Fit/Training;Compression bandaging;Manual techniques;Wheelchair  mobility training;Passive range of motion;Dry needling;Energy conservation;Splinting;Taping;Visual/perceptual remediation/compensation;Vestibular   PT Next Visit Plan stairs   PT Home Exercise Plan see sheet   Consulted and Agree with Plan of Care Patient;Family member/caregiver   Family Member Consulted wife      Patient will benefit from skilled therapeutic intervention in order to improve the following deficits and impairments:  Abnormal gait, Cardiopulmonary status limiting activity, Decreased activity tolerance, Decreased balance, Decreased endurance, Decreased knowledge of precautions, Decreased knowledge of use of DME, Decreased safety awareness, Decreased range of motion, Decreased mobility, Decreased strength, Difficulty walking, Dizziness, Hypomobility, Impaired flexibility, Impaired perceived functional ability, Impaired UE functional use, Postural dysfunction, Improper body mechanics, Pain  Visit  Diagnosis: Other abnormalities of gait and mobility  Muscle weakness (generalized)  Unspecified lack of coordination     Problem List Patient Active Problem List   Diagnosis Date Noted  . Hydronephrosis, left 11/07/2015  . Sepsis ([place]) 09/29/2015  . Acute pyelonephritis 09/25/2015  . Frequency of urination 05/29/2015  . BPH (benign prostatic hyperplasia) 09/05/2014  . Hyperlipidemia 09/05/2014  . Enlarged prostate with urinary obstruction 07/03/2014  . History of total knee replacement 06/02/2014  . Status post total right knee replacement 06/02/2014  . Osteoarthritis of knee 05/12/2014  . HTN (hypertension) 06/28/2013  . Hypertension 06/28/2013   Janna Arch, PT, DPT   Janna Arch 08/26/2016, 2:48 PM  [place] MAIN [facility] SERVICES [address] [place], [place], [address] Phone: [phone]   Fax:  [phone]  Name: DALTIN CRIST MRN: [id number] Date of Birth: March 18, 1945

## 2016-08-27 DIAGNOSIS — Z8673 Personal history of transient ischemic attack (TIA), and cerebral infarction without residual deficits: Secondary | ICD-10-CM | POA: Insufficient documentation

## 2016-08-28 ENCOUNTER — Ambulatory Visit: Payer: Medicare Other

## 2016-08-28 DIAGNOSIS — R279 Unspecified lack of coordination: Secondary | ICD-10-CM

## 2016-08-28 DIAGNOSIS — R2689 Other abnormalities of gait and mobility: Secondary | ICD-10-CM

## 2016-08-28 DIAGNOSIS — M6281 Muscle weakness (generalized): Secondary | ICD-10-CM

## 2016-08-28 NOTE — Therapy (Signed)
[place] MAIN [facility] SERVICES [address] [place], [place], [address] Phone: [phone]   Fax:  [phone]  Physical Therapy Treatment  Patient Details  Name: Craig [name] MRN: [id number] Date of Birth: 1945-06-07 Referring Provider: Dr. Brooke Bonito  Encounter Date: 08/28/2016      PT End of Session - 08/28/16 1459    Visit Number 7   Number of Visits 12   Date for PT Re-Evaluation 09/17/16   Authorization - Visit Number 7   Authorization - Number of Visits 10   PT Start Time 1300   PT Stop Time 1345   PT Time Calculation (min) 45 min   Equipment Utilized During Treatment Gait belt   Activity Tolerance Patient tolerated treatment well   Behavior During Therapy [facility] for tasks assessed/performed      Past Medical History:  Diagnosis Date  . Anginal pain ([place])   . BPH (benign prostatic hyperplasia)   . Cancer ([place])    skin cancer   . Chronic kidney disease   . GERD (gastroesophageal reflux disease)   . History of hiatal hernia   . Hypercholesterolemia   . Hypertension     Past Surgical History:  Procedure Laterality Date  . IR GENERIC HISTORICAL  09/29/2015   IR NEPHROSTOMY PLACEMENT LEFT 09/29/2015 [name], MD [facility]INTER[facility] RAD  . IR GENERIC HISTORICAL  10/20/2015   IR NEPHROSTOGRAM LEFT THRU EXISTING ACCESS 10/20/2015 [facility]  . JOINT REPLACEMENT    . LAPAROSCOPIC NEPHRECTOMY, HAND ASSISTED Left 11/07/2015   Procedure: HAND ASSISTED LAPAROSCOPIC NEPHRECTOMY;  Surgeon: Hollice Espy, MD;  Location: [facility] ORS;  Service: Urology;  Laterality: Left;  . LEFT HEART CATH AND CORONARY ANGIOGRAPHY N/A 06/28/2016   Procedure: Left Heart Cath and Coronary Angiography;  Surgeon: Yolonda Kida, MD;  Location: [facility];  Service: Cardiovascular;  Laterality: N/A;  . PROSTATE ABLATION N/A 04/10/2016   Procedure: PROSTATE ABLATION;  Surgeon: Hollice Espy, MD;  Location: [facility] ORS;  Service: Urology;  Laterality:  N/A;  . REPLACEMENT TOTAL KNEE Right   . THULIUM LASER TURP (TRANSURETHRAL RESECTION OF PROSTATE) N/A 04/10/2016   Procedure: THULIUM LASER TURP (TRANSURETHRAL RESECTION OF PROSTATE);  Surgeon: Hollice Espy, MD;  Location: [facility] ORS;  Service: Urology;  Laterality: N/A;  . TONSILLECTOMY      There were no vitals filed for this visit.      Subjective Assessment - 08/28/16 1303    Subjective Pt. went to neurologist and has been cleared for driving. Cortisone shot has worn off and pt. has a knee pain now.    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 Pt. wife reports it took approximately 14 hours to do a stroke assessment due to the medications and became past the 24 hour time for treatment once detected.   Main problem per wife is attention span and vision issue. Left knee needs a replacement so is wearing a replacement. 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 Pt. was independent prior to bypass surgery in May.    Limitations Lifting;Reading;Standing;Walking;Writing;House hold activities   How long can you sit comfortably? 30 minutes   How long can you stand comfortably? 15 minutes   How long can you walk comfortably? [place]  Patient Stated Goals get back to previous level of function, mowing the yard, ambulating without assistive device   Currently in Pain? Yes   Pain Score 2    Pain Location Knee   Pain Orientation Left   Pain Descriptors / Indicators Aching;Pounding   Pain Type Chronic pain   Pain Onset More than a month ago   Pain Frequency Constant       5x STS: 10 seconds BERG" 53/56 10MWT:  10 seconds without cane, 12 seconds with cane  ABC: 77.78% Neuro reciprocal arm and leg 10x each side in seated, no mistakes reciprocal arm  and leg switching between every 3, 8x SLS: LLE 3 seconds, RLE 6 seconds Stair negotiation with quad cane and right railing with cues for placement of cane and safe body mechanics/sequencing x3 trials Gait Ambulating in hallway with horizontal head turns 2x50 ft, vertical head turns 2x50 ft, and slowing and speeding up 2x50 ft.       [facility] PT Assessment - 08/28/16 0001      Berg Balance Test   Sit to Stand Able to stand without using hands and stabilize independently   Standing Unsupported Able to stand safely 2 minutes   Sitting with Back Unsupported but Feet Supported on Floor or Stool Able to sit safely and securely 2 minutes   Stand to Sit Sits safely with minimal use of hands   Transfers Able to transfer safely, minor use of hands   Standing Unsupported with Eyes Closed Able to stand 10 seconds safely   Standing Ubsupported with Feet Together Able to place feet together independently and stand 1 minute safely   From Standing, Reach Forward with Outstretched Arm Can reach confidently >25 cm (10")   From Standing Position, Pick up Object from Floor Able to pick up shoe safely and easily   From Standing Position, Turn to Look Behind Over each Shoulder Looks behind from both sides and weight shifts well   Turn 360 Degrees Able to turn 360 degrees safely one side only in 4 seconds or less   Standing Unsupported, Alternately Place Feet on Step/Stool Able to stand independently and complete 8 steps >20 seconds   Standing Unsupported, One Foot in Front Able to place foot tandem independently and hold 30 seconds   Standing on One Leg Able to lift leg independently and hold 5-10 seconds   Total Score 53           PT Long Term Goals - 08/28/16 1500      PT LONG TERM GOAL #1   Title Patient (> 70 years old) will complete five times sit to stand test in < 15 seconds indicating an increased LE strength and improved balance.   Baseline 6/19: 35 seconds 7/11: 10 seconds   Time 6   Period  Weeks   Status Achieved     PT LONG TERM GOAL #2   Title Patient will increase Berg Balance score by > 6 points (49/56) to demonstrate decreased fall risk during functional activities.   Baseline 6/19: 43/56 7/11: 53/56   Time 6   Period Weeks   Status Achieved     PT LONG TERM GOAL #3   Title Patient will increase 10 meter walk test to >1.51m/s as to improve gait speed for better community ambulation and to reduce fall risk.   Baseline 6/19: .625 with rear walker and CGA 7/11 w/o cane 1.68m/s   Time 6   Period Weeks   Status Achieved  PT LONG TERM GOAL #4   Title Patient will increase ABC scale score >80% to demonstrate better functional mobility and better confidence with ADLs.    Baseline 6/19: 25.63%, Sep 12, 2022: 77.78%   Time 6   Period Weeks   Status Achieved               Plan - 09-11-16 1500    Clinical Impression Statement Patient has progressed meeting all his goals and demonstrating safe ambulatory capacity. 5xSTS: 10 seconds, BERG 53/56, 10MWT: 10 seconds w/o cane, 12 s w/ cane, ABC 77.78%.Pt. Is ready for discharge at this time and will begin heart track.    Rehab Potential Fair   Clinical Impairments Affecting Rehab Potential chest precautions, hiatial hernia, CVA   PT Frequency 2x / week   PT Duration 6 weeks   PT Treatment/Interventions ADLs/Self Care Home Management;Cryotherapy;Parrafin;Ultrasound;Traction;Moist Heat;Iontophoresis 4mg /ml Dexamethasone;Electrical Stimulation;DME Instruction;Gait training;Stair training;Functional mobility training;Neuromuscular re-education;Balance training;Therapeutic exercise;Therapeutic activities;Patient/family education;Cognitive remediation;Orthotic Fit/Training;Compression bandaging;Manual techniques;Wheelchair mobility training;Passive range of motion;Dry needling;Energy conservation;Splinting;Taping;Visual/perceptual remediation/compensation;Vestibular   PT Home Exercise Plan see sheet   Consulted and Agree with Plan of  Care Patient;Family member/caregiver   Family Member Consulted wife      Patient will benefit from skilled therapeutic intervention in order to improve the following deficits and impairments:  Abnormal gait, Cardiopulmonary status limiting activity, Decreased activity tolerance, Decreased balance, Decreased endurance, Decreased knowledge of precautions, Decreased knowledge of use of DME, Decreased safety awareness, Decreased range of motion, Decreased mobility, Decreased strength, Difficulty walking, Dizziness, Hypomobility, Impaired flexibility, Impaired perceived functional ability, Impaired UE functional use, Postural dysfunction, Improper body mechanics, Pain  Visit Diagnosis: Other abnormalities of gait and mobility  Muscle weakness (generalized)  Unspecified lack of coordination       G-Codes - 09/11/16 1501    Functional Assessment Tool Used (Outpatient Only) 5xSTS, BERG, 10MWT, ABC, gait analysis, clinicial judgement   Functional Limitation Mobility: Walking and moving around   Mobility: Walking and Moving Around Current Status [phone]) At least 1 percent but less than 20 percent impaired, limited or restricted   Mobility: Walking and Moving Around Goal Status [phone]) At least 1 percent but less than 20 percent impaired, limited or restricted   Mobility: Walking and Moving Around Discharge Status [phone]) At least 1 percent but less than 20 percent impaired, limited or restricted      Problem List Patient Active Problem List   Diagnosis Date Noted  . Hydronephrosis, left 11/07/2015  . Sepsis ([place]) 09/29/2015  . Acute pyelonephritis 09/25/2015  . Frequency of urination 05/29/2015  . BPH (benign prostatic hyperplasia) 09/05/2014  . Hyperlipidemia 09/05/2014  . Enlarged prostate with urinary obstruction 07/03/2014  . History of total knee replacement 06/02/2014  . Status post total right knee replacement 06/02/2014  . Osteoarthritis of knee 05/12/2014  . HTN (hypertension)  06/28/2013  . Hypertension 06/28/2013   Janna Arch, PT, DPT   Janna Arch 09/11/2016, 3:03 PM  [place] MAIN [facility] SERVICES [address] [place], [place], [address] Phone: [phone]   Fax:  [phone]  Name: QUANTEL MCINTURFF MRN: [id number] Date of Birth: [birth year]

## 2016-09-02 ENCOUNTER — Ambulatory Visit: Payer: Medicare Other

## 2016-09-04 ENCOUNTER — Ambulatory Visit: Payer: Medicare Other

## 2016-09-09 ENCOUNTER — Ambulatory Visit: Payer: Medicare Other

## 2016-09-11 ENCOUNTER — Ambulatory Visit: Payer: Medicare Other

## 2016-09-12 ENCOUNTER — Encounter: Payer: Medicare Other | Attending: Internal Medicine | Admitting: *Deleted

## 2016-09-12 ENCOUNTER — Encounter: Payer: Self-pay | Admitting: *Deleted

## 2016-09-12 VITALS — Ht 70.25 in | Wt 231.6 lb

## 2016-09-12 DIAGNOSIS — Z951 Presence of aortocoronary bypass graft: Secondary | ICD-10-CM | POA: Diagnosis present

## 2016-09-12 DIAGNOSIS — Z48812 Encounter for surgical aftercare following surgery on the circulatory system: Secondary | ICD-10-CM | POA: Insufficient documentation

## 2016-09-12 NOTE — Progress Notes (Signed)
Daily Session Note  Patient Details  Name: Craig [name] MRN: [id number] Date of Birth: 1945-05-04 Referring Provider:     Cardiac Rehab from 09/12/2016 in [facility]  Referring Provider  [facility]      Encounter Date: 09/12/2016  Check In:     Session Check In - 09/12/16 1318      Check-In   Location [facility]Cardiac [facility] Pulmonary Rehab   Staff Present Renita Papa, RN Vickki Hearing, BA, ACSM CEP, Exercise Physiologist   Supervising physician immediately available to respond to emergencies See telemetry face sheet for immediately available ER MD   Medication changes reported     No   Fall or balance concerns reported    No   Warm-up and Cool-down Performed as group-led instruction   Resistance Training Performed Yes   VAD Patient? No     Pain Assessment   Currently in Pain? No/denies           Exercise Prescription Changes - 09/12/16 1400      Response to Exercise   Blood Pressure (Admit) 122/84   Blood Pressure (Exercise) 148/86   Blood Pressure (Exit) 134/82   Heart Rate (Admit) 58 bpm   Heart Rate (Exercise) 94 bpm   Heart Rate (Exit) 55 bpm   Oxygen Saturation (Admit) 97 %   Oxygen Saturation (Exercise) 98 %   Rating of Perceived Exertion (Exercise) 13      History  Smoking Status  . Former Smoker  . Packs/day: 1.50  . Years: 20.00  . Quit date: 09/25/1983  Smokeless Tobacco  . Never Used    Goals Met:  Exercise tolerated well Personal goals reviewed No report of cardiac concerns or symptoms Strength training completed today  Goals Unmet:  Not Applicable  Comments: Med Review Completed   Dr. Emily Filbert is Medical Director for [place] and [facility].

## 2016-09-12 NOTE — Progress Notes (Deleted)
Daily Session Note  Patient Details  Name: SHIMSHON NARULA MRN: [id number] Date of Birth: 29-Jul-1945 Referring Provider:    Encounter Date: 09/12/2016  Check In:     Session Check In - 09/12/16 1318      Check-In   Location [facility] Pul[facility]   Staff Present Renita Papa, RN Vickki Hearing, BA, ACSM CEP, Exercise Physiologist   Supervising physician immediately available to respond to emergencies See telemetry face sheet for immediately available ER MD   Medication changes reported     No   Fall or balance concerns reported    No   Warm-up and Cool-down Performed as group-led instruction   Resistance Training Performed Yes   VAD Patient? No     Pain Assessment   Currently in Pain? No/denies         History  Smoking Status  . Former Smoker  . Packs/day: 1.50  . Years: 20.00  . Quit date: 09/25/1983  Smokeless Tobacco  . Never Used    Goals Met:  Exercise tolerated well Personal goals reviewed No report of cardiac concerns or symptoms Strength training completed today  Goals Unmet:  Not Applicable  Comments: Med Review Completed   Dr. Emily Filbert is Medical Director for [place] and [facility].

## 2016-09-12 NOTE — Progress Notes (Signed)
Cardiac Individual Treatment Plan  Patient Details  Name: Craig [name] MRN: [id number] Date of Birth: 1945/09/24 Referring Provider:     Cardiac Rehab from 09/12/2016 in [facility] Cardiac and Pulmonary Rehab  Referring Provider  [facility]      Initial Encounter Date:    Cardiac Rehab from 09/12/2016 in [facility] Cardiac and Pulmonary Rehab  Date  09/12/16  Referring Provider  [facility]      Visit Diagnosis: S/P CABG x 3  Patient's Home Medications on Admission:  Current Outpatient Prescriptions:  .  amiodarone (PACERONE) 200 MG tablet, Take by mouth., Disp: , Rfl:  .  apixaban (ELIQUIS) 5 MG TABS tablet, Take by mouth., Disp: , Rfl:  .  aspirin EC 81 MG tablet, Take 81 mg by mouth daily., Disp: , Rfl:  .  atorvastatin (LIPITOR) 80 MG tablet, Take by mouth., Disp: , Rfl:  .  cetirizine (ZYRTEC) 10 MG tablet, Take 10 mg by mouth daily., Disp: , Rfl:  .  cyanocobalamin (,VITAMIN B-12,) 1000 MCG/ML injection, Inject into the muscle., Disp: , Rfl:  .  diclofenac sodium (VOLTAREN) 1 % GEL, Apply topically., Disp: , Rfl:  .  FLUoxetine (PROZAC) 20 MG capsule, Take by mouth., Disp: , Rfl:  .  fluticasone (FLONASE) 50 MCG/ACT nasal spray, Place 2 sprays into both nostrils daily., Disp: , Rfl: 3 .  gabapentin (NEURONTIN) 100 MG capsule, Take by mouth., Disp: , Rfl:  .  lisinopril (PRINIVIL,ZESTRIL) 30 MG tablet, Take 30 mg by mouth every morning. , Disp: , Rfl: 4 .  metoprolol succinate (TOPROL-XL) 25 MG 24 hr tablet, , Disp: , Rfl: 10 .  Multiple Vitamin (MULTIVITAMIN) tablet, Take 1 tablet by mouth daily., Disp: , Rfl:  .  omeprazole (PRILOSEC) 40 MG capsule, Take 40 mg by mouth 2 (two) times daily., Disp: , Rfl: 10 .  tamsulosin (FLOMAX) 0.4 MG CAPS capsule, Take 0.4 mg by mouth daily., Disp: , Rfl: 3 .  vitamin E 100 UNIT capsule, Take 100 Units by mouth daily., Disp: , Rfl:  .  docusate sodium (COLACE) 100 MG capsule, Take 1 capsule (100 mg total) by mouth 2 (two) times daily. (Patient not  taking: Reported on 09/12/2016), Disp: 60 capsule, Rfl: 0 .  finasteride (PROSCAR) 5 MG tablet, Take by mouth., Disp: , Rfl:  .  isosorbide mononitrate (IMDUR) 30 MG 24 hr tablet, , Disp: , Rfl: 4 .  simvastatin (ZOCOR) 40 MG tablet, Take 40 mg by mouth daily at 6 PM. , Disp: , Rfl: 5 .  tiZANidine (ZANAFLEX) 2 MG tablet, every 8 (eight) hours as needed. , Disp: , Rfl: 0 .  traMADol (ULTRAM) 50 MG tablet, Take 50 mg by mouth every 6 (six) hours as needed for moderate pain. , Disp: , Rfl: 0  Past Medical History: Past Medical History:  Diagnosis Date  . Anginal pain ([place])   . BPH (benign prostatic hyperplasia)   . Cancer ([place])    skin cancer   . Chronic kidney disease   . GERD (gastroesophageal reflux disease)   . History of hiatal hernia   . Hypercholesterolemia   . Hypertension     Tobacco Use: History  Smoking Status  . Former Smoker  . Packs/day: 1.50  . Years: 20.00  . Quit date: 09/25/1983  Smokeless Tobacco  . Never Used    Labs: Recent Review Flowsheet Data    There is no flowsheet data to display.       Exercise Target Goals: Date: 09/12/16  Exercise Program Goal: Individual exercise prescription set with THRR, safety & activity barriers. Participant demonstrates ability to understand and report RPE using BORG scale, to self-measure pulse accurately, and to acknowledge the importance of the exercise prescription.  Exercise Prescription Goal: Starting with aerobic activity 30 plus minutes a day, 3 days per week for initial exercise prescription. Provide home exercise prescription and guidelines that participant acknowledges understanding prior to discharge.  Activity Barriers & Risk Stratification:     Activity Barriers & Cardiac Risk Stratification - 09/12/16 1354      Activity Barriers & Cardiac Risk Stratification   Activity Barriers Left Knee Replacement  waiting for knee replacement   Cardiac Risk Stratification High      6 Minute Walk:     6  Minute Walk    Row Name 09/12/16 1423         6 Minute Walk   Phase Initial     Distance 795 feet     Walk Time 6 minutes     # of Rest Breaks 0     MPH 1.5     METS 1.8     RPE 13     VO2 Peak 6.2     Symptoms Yes (comment)     Comments knee pain 4/10 L knee     Resting HR 58 bpm     Resting BP 122/84     Max Ex. HR 94 bpm     Max Ex. BP 148/86     2 Minute Post BP 134/82        Oxygen Initial Assessment:   Oxygen Re-Evaluation:   Oxygen Discharge (Final Oxygen Re-Evaluation):   Initial Exercise Prescription:     Initial Exercise Prescription - 09/12/16 1400      Date of Initial Exercise RX and Referring Provider   Date 09/12/16   Referring Provider [facility]     Treadmill   MPH 1.5   Grade 0   Minutes 15   METs 1.8     NuStep   Level 2   SPM 80   Minutes 15   METs 1.8     Arm Ergometer   Level 1   RPM 50   Minutes 15   METs 1.5     Prescription Details   Frequency (times per week) 3   Duration Progress to 45 minutes of aerobic exercise without signs/symptoms of physical distress     Intensity   THRR 40-80% of Max Heartrate 94-131   Ratings of Perceived Exertion 11-13   Perceived Dyspnea 0-4     Resistance Training   Training Prescription Yes   Weight 3 lb   Reps 10-15      Perform Capillary Blood Glucose checks as needed.  Exercise Prescription Changes:     Exercise Prescription Changes    Row Name 09/12/16 1400             Response to Exercise   Blood Pressure (Admit) 122/84       Blood Pressure (Exercise) 148/86       Blood Pressure (Exit) 134/82       Heart Rate (Admit) 58 bpm       Heart Rate (Exercise) 94 bpm       Heart Rate (Exit) 55 bpm       Oxygen Saturation (Admit) 97 %       Oxygen Saturation (Exercise) 98 %       Rating of Perceived Exertion (Exercise) 13  Exercise Comments:   Exercise Goals and Review:     Exercise Goals    Row Name 09/12/16 1423             Exercise Goals    Increase Physical Activity Yes       Intervention Provide advice, education, support and counseling about physical activity/exercise needs.;Develop an individualized exercise prescription for aerobic and resistive training based on initial evaluation findings, risk stratification, comorbidities and participant's personal goals.       Expected Outcomes Achievement of increased cardiorespiratory fitness and enhanced flexibility, muscular endurance and strength shown through measurements of functional capacity and personal statement of participant.       Increase Strength and Stamina Yes       Intervention Provide advice, education, support and counseling about physical activity/exercise needs.;Develop an individualized exercise prescription for aerobic and resistive training based on initial evaluation findings, risk stratification, comorbidities and participant's personal goals.       Expected Outcomes Achievement of increased cardiorespiratory fitness and enhanced flexibility, muscular endurance and strength shown through measurements of functional capacity and personal statement of participant.          Exercise Goals Re-Evaluation :   Discharge Exercise Prescription (Final Exercise Prescription Changes):     Exercise Prescription Changes - 09/12/16 1400      Response to Exercise   Blood Pressure (Admit) 122/84   Blood Pressure (Exercise) 148/86   Blood Pressure (Exit) 134/82   Heart Rate (Admit) 58 bpm   Heart Rate (Exercise) 94 bpm   Heart Rate (Exit) 55 bpm   Oxygen Saturation (Admit) 97 %   Oxygen Saturation (Exercise) 98 %   Rating of Perceived Exertion (Exercise) 13      Nutrition:  Target Goals: Understanding of nutrition guidelines, daily intake of sodium 1500mg , cholesterol 200mg , calories 30% from fat and 7% or less from saturated fats, daily to have 5 or more servings of fruits and vegetables.  Biometrics:     Pre Biometrics - 09/12/16 1422      Pre Biometrics    Height 5' 10.25" (1.784 m)   Weight 231 lb 9.6 oz (105.1 kg)   Waist Circumference 43.75 inches   Hip Circumference 46 inches   Waist to Hip Ratio 0.95 %   BMI (Calculated) 33.1   Single Leg Stand 13.27 seconds       Nutrition Therapy Plan and Nutrition Goals:   Nutrition Discharge: Rate Your Plate Scores:     Nutrition Assessments - 09/12/16 1334      MEDFICTS Scores   Pre Score 58      Nutrition Goals Re-Evaluation:   Nutrition Goals Discharge (Final Nutrition Goals Re-Evaluation):   Psychosocial: Target Goals: Acknowledge presence or absence of significant depression and/or stress, maximize coping skills, provide positive support system. Participant is able to verbalize types and ability to use techniques and skills needed for reducing stress and depression.   Initial Review & Psychosocial Screening:     Initial Psych Review & Screening - 09/12/16 1345      Initial Review   Current issues with None Identified     Family Dynamics   Good Support System? Yes  wife     Barriers   Psychosocial barriers to participate in program There are no identifiable barriers or psychosocial needs.     Screening Interventions   Interventions Encouraged to exercise;Program counselor consult      Quality of Life Scores:      Quality of Life - 09/12/16  1347      Quality of Life Scores   Health/Function Pre 20.79 %   Socioeconomic Pre 21 %   Psych/Spiritual Pre 21 %   Family Pre 21 %   GLOBAL Pre 20.91 %      PHQ-9: Recent Review Flowsheet Data    Depression screen [facility] 2/9 09/12/2016   Decreased Interest 2   Down, Depressed, Hopeless 0   PHQ - 2 Score 2   Altered sleeping 1   Tired, decreased energy 3   Change in appetite 0   Feeling bad or failure about yourself  0   Trouble concentrating 0   Moving slowly or fidgety/restless 1   Suicidal thoughts 0   PHQ-9 Score 7   Difficult doing work/chores Somewhat difficult     Interpretation of Total Score   Total Score Depression Severity:  1-4 = Minimal depression, 5-9 = Mild depression, 10-14 = Moderate depression, 15-19 = Moderately severe depression, 20-27 = Severe depression   Psychosocial Evaluation and Intervention:   Psychosocial Re-Evaluation:   Psychosocial Discharge (Final Psychosocial Re-Evaluation):   Vocational Rehabilitation: Provide vocational rehab assistance to qualifying candidates.   Vocational Rehab Evaluation & Intervention:     Vocational Rehab - 09/12/16 1353      Initial Vocational Rehab Evaluation & Intervention   Assessment shows need for Vocational Rehabilitation No      Education: Education Goals: Education classes will be provided on a weekly basis, covering required topics. Participant will state understanding/return demonstration of topics presented.  Learning Barriers/Preferences:     Learning Barriers/Preferences - 09/12/16 1352      Learning Barriers/Preferences   Learning Barriers None   Learning Preferences Verbal Instruction      Education Topics: General Nutrition Guidelines/Fats and Fiber: -Group instruction provided by verbal, written material, models and posters to present the general guidelines for heart healthy nutrition. Gives an explanation and review of dietary fats and fiber.   Controlling Sodium/Reading Food Labels: -Group verbal and written material supporting the discussion of sodium use in heart healthy nutrition. Review and explanation with models, verbal and written materials for utilization of the food label.   Exercise Physiology & Risk Factors: - Group verbal and written instruction with models to review the exercise physiology of the cardiovascular system and associated critical values. Details cardiovascular disease risk factors and the goals associated with each risk factor.   Aerobic Exercise & Resistance Training: - Gives group verbal and written discussion on the health impact of inactivity. On the  components of aerobic and resistive training programs and the benefits of this training and how to safely progress through these programs.   Flexibility, Balance, General Exercise Guidelines: - Provides group verbal and written instruction on the benefits of flexibility and balance training programs. Provides general exercise guidelines with specific guidelines to those with heart or lung disease. Demonstration and skill practice provided.   Stress Management: - Provides group verbal and written instruction about the health risks of elevated stress, cause of high stress, and healthy ways to reduce stress.   Depression: - Provides group verbal and written instruction on the correlation between heart/lung disease and depressed mood, treatment options, and the stigmas associated with seeking treatment.   Anatomy & Physiology of the Heart: - Group verbal and written instruction and models provide basic cardiac anatomy and physiology, with the coronary electrical and arterial systems. Review of: AMI, Angina, Valve disease, Heart Failure, Cardiac Arrhythmia, Pacemakers, and the ICD.   Cardiac Procedures: - Group verbal  and written instruction and models to describe the testing methods done to diagnose heart disease. Reviews the outcomes of the test results. Describes the treatment choices: Medical Management, Angioplasty, or Coronary Bypass Surgery.   Cardiac Medications: - Group verbal and written instruction to review commonly prescribed medications for heart disease. Reviews the medication, class of the drug, and side effects. Includes the steps to properly store meds and maintain the prescription regimen.   Go Sex-Intimacy & Heart Disease, Get SMART - Goal Setting: - Group verbal and written instruction through game format to discuss heart disease and the return to sexual intimacy. Provides group verbal and written material to discuss and apply goal setting through the application of the  S.M.A.R.T. Method.   Other Matters of the Heart: - Provides group verbal, written materials and models to describe Heart Failure, Angina, Valve Disease, and Diabetes in the realm of heart disease. Includes description of the disease process and treatment options available to the cardiac patient.   Exercise & Equipment Safety: - Individual verbal instruction and demonstration of equipment use and safety with use of the equipment.   Cardiac Rehab from 09/12/2016 in [facility] Cardiac and Pulmonary Rehab  Date  09/12/16  Educator  [facility]  Instruction Review Code  2- meets goals/outcomes      Infection Prevention: - Provides verbal and written material to individual with discussion of infection control including proper hand washing and proper equipment cleaning during exercise session.   Cardiac Rehab from 09/12/2016 in [facility] Cardiac and Pulmonary Rehab  Date  09/12/16  Educator  [facility]  Instruction Review Code  2- meets goals/outcomes      Falls Prevention: - Provides verbal and written material to individual with discussion of falls prevention and safety.   Cardiac Rehab from 09/12/2016 in [facility] Cardiac and Pulmonary Rehab  Date  09/12/16  Educator  [facility]  Instruction Review Code  2- meets goals/outcomes      Diabetes: - Individual verbal and written instruction to review signs/symptoms of diabetes, desired ranges of glucose level fasting, after meals and with exercise. Advice that pre and post exercise glucose checks will be done for 3 sessions at entry of program.    Knowledge Questionnaire Score:     Knowledge Questionnaire Score - 09/12/16 1352      Knowledge Questionnaire Score   Pre Score 20/28  correct answers reviewed with [name]      Core Components/Risk Factors/Patient Goals at Admission:     Personal Goals and Risk Factors at Admission - 09/12/16 1333      Core Components/Risk Factors/Patient Goals on Admission   Hypertension Yes   Intervention Provide education on lifestyle  modifcations including regular physical activity/exercise, weight management, moderate sodium restriction and increased consumption of fresh fruit, vegetables, and low fat dairy, alcohol moderation, and smoking cessation.;Monitor prescription use compliance.   Expected Outcomes Short Term: Continued assessment and intervention until BP is < 140/85mm HG in hypertensive participants. < 130/4mm HG in hypertensive participants with diabetes, heart failure or chronic kidney disease.;Long Term: Maintenance of blood pressure at goal levels.   Lipids Yes   Intervention Provide education and support for participant on nutrition & aerobic/resistive exercise along with prescribed medications to achieve LDL 70mg , HDL >40mg .   Expected Outcomes Short Term: Participant states understanding of desired cholesterol values and is compliant with medications prescribed. Participant is following exercise prescription and nutrition guidelines.;Long Term: Cholesterol controlled with medications as prescribed, with individualized exercise RX and with personalized nutrition plan. Value goals: LDL <  70mg , HDL > 40 mg.   Stress Yes   Intervention Offer individual and/or small group education and counseling on adjustment to heart disease, stress management and health-related lifestyle change. Teach and support self-help strategies.;Refer participants experiencing significant psychosocial distress to appropriate mental health specialists for further evaluation and treatment. When possible, include family members and significant others in education/counseling sessions.   Expected Outcomes Short Term: Participant demonstrates changes in health-related behavior, relaxation and other stress management skills, ability to obtain effective social support, and compliance with psychotropic medications if prescribed.;Long Term: Emotional wellbeing is indicated by absence of clinically significant psychosocial distress or social isolation.       Core Components/Risk Factors/Patient Goals Review:    Core Components/Risk Factors/Patient Goals at Discharge (Final Review):    ITP Comments:     ITP Comments    Row Name 09/12/16 1325           ITP Comments Med Review completed. Initial ITP created. Diagnosis can be found in [place] 5/16          Comments: Initial ITP

## 2016-09-12 NOTE — Patient Instructions (Signed)
Patient Instructions  Patient Details  Name: Craig [name] MRN: [id number] Date of Birth: [birth year] Referring Provider:  Yolonda Kida, MD  Below are the personal goals you chose as well as exercise and nutrition goals. Our goal is to help you keep on track towards obtaining and maintaining your goals. We will be discussing your progress on these goals with you throughout the program.  Initial Exercise Prescription:     Initial Exercise Prescription - 09/12/16 1400      Date of Initial Exercise RX and Referring Provider   Date 09/12/16   Referring Provider [facility]     Treadmill   MPH 1.5   Grade 0   Minutes 15   METs 1.8     NuStep   Level 2   SPM 80   Minutes 15   METs 1.8     Arm Ergometer   Level 1   RPM 50   Minutes 15   METs 1.5     Prescription Details   Frequency (times per week) 3   Duration Progress to 45 minutes of aerobic exercise without signs/symptoms of physical distress     Intensity   THRR 40-80% of Max Heartrate 94-131   Ratings of Perceived Exertion 11-13   Perceived Dyspnea 0-4     Resistance Training   Training Prescription Yes   Weight 3 lb   Reps 10-15      Exercise Goals: Frequency: Be able to perform aerobic exercise three times per week working toward 3-5 days per week.  Intensity: Work with a perceived exertion of 11 (fairly light) - 15 (hard) as tolerated. Follow your new exercise prescription and watch for changes in prescription as you progress with the program. Changes will be reviewed with you when they are made.  Duration: You should be able to do 30 minutes of continuous aerobic exercise in addition to a 5 minute warm-up and a 5 minute cool-down routine.  Nutrition Goals: Your personal nutrition goals will be established when you do your nutrition analysis with the dietician.  The following are nutrition guidelines to follow: Cholesterol < 200mg /day Sodium < 1500mg /day Fiber: Men over 50 yrs - 30 grams per  day  Personal Goals:     Personal Goals and Risk Factors at Admission - 09/12/16 1333      Core Components/Risk Factors/Patient Goals on Admission   Hypertension Yes   Intervention Provide education on lifestyle modifcations including regular physical activity/exercise, weight management, moderate sodium restriction and increased consumption of fresh fruit, vegetables, and low fat dairy, alcohol moderation, and smoking cessation.;Monitor prescription use compliance.   Expected Outcomes Short Term: Continued assessment and intervention until BP is < 140/56mm HG in hypertensive participants. < 130/56mm HG in hypertensive participants with diabetes, heart failure or chronic kidney disease.;Long Term: Maintenance of blood pressure at goal levels.   Lipids Yes   Intervention Provide education and support for participant on nutrition & aerobic/resistive exercise along with prescribed medications to achieve LDL 70mg , HDL >40mg .   Expected Outcomes Short Term: Participant states understanding of desired cholesterol values and is compliant with medications prescribed. Participant is following exercise prescription and nutrition guidelines.;Long Term: Cholesterol controlled with medications as prescribed, with individualized exercise RX and with personalized nutrition plan. Value goals: LDL < 70mg , HDL > 40 mg.   Stress Yes   Intervention Offer individual and/or small group education and counseling on adjustment to heart disease, stress management and health-related lifestyle change. Teach and support self-help strategies.;Refer participants experiencing  significant psychosocial distress to appropriate mental health specialists for further evaluation and treatment. When possible, include family members and significant others in education/counseling sessions.   Expected Outcomes Short Term: Participant demonstrates changes in health-related behavior, relaxation and other stress management skills, ability to  obtain effective social support, and compliance with psychotropic medications if prescribed.;Long Term: Emotional wellbeing is indicated by absence of clinically significant psychosocial distress or social isolation.      Tobacco Use Initial Evaluation: History  Smoking Status  . Former Smoker  . Packs/day: 1.50  . Years: 20.00  . Quit date: 09/25/1983  Smokeless Tobacco  . Never Used    Copy of goals given to participant.

## 2016-09-17 ENCOUNTER — Ambulatory Visit: Payer: Medicare Other

## 2016-09-17 DIAGNOSIS — Z48812 Encounter for surgical aftercare following surgery on the circulatory system: Secondary | ICD-10-CM | POA: Diagnosis not present

## 2016-09-17 DIAGNOSIS — Z951 Presence of aortocoronary bypass graft: Secondary | ICD-10-CM

## 2016-09-17 NOTE — Progress Notes (Addendum)
Daily Session Note  Patient Details  Name: Craig [name] MRN: [id number] Date of Birth: [birth year] Referring Provider:     Cardiac Rehab from 09/12/2016 in [facility] Cardiac and Pulmonary Rehab  Referring Provider  [facility]      Encounter Date: 09/17/2016  Check In:     Session Check In - 09/17/16 3570      Check-In   Staff Present Gerlene Burdock, RN, Vickki Hearing, BA, ACSM CEP, Exercise Physiologist;Krista Frederico Hamman, RN BSN   Supervising physician immediately available to respond to emergencies See telemetry face sheet for immediately available ER MD   Medication changes reported     No   Fall or balance concerns reported    No   Warm-up and Cool-down Performed on first and last piece of equipment   Resistance Training Performed Yes   VAD Patient? No     Pain Assessment   Currently in Pain? No/denies         History  Smoking Status  . Former Smoker  . Packs/day: 1.50  . Years: 20.00  . Quit date: 09/25/1983  Smokeless Tobacco  . Never Used    Goals Met:  Independence with exercise equipment Exercise tolerated well No report of cardiac concerns or symptoms Strength training completed today  Goals Unmet:  Not Applicable  Comments: First full day of exercise!  Patient was oriented to gym and equipment including functions, settings, policies, and procedures.  Patient's individual exercise prescription and treatment plan were reviewed.  All starting workloads were established based on the results of the 6 minute walk test done at initial orientation visit.  The plan for exercise progression was also introduced and progression will be customized based on patient's performance and goals.    Dr. Emily Filbert is Medical Director for [facility].

## 2016-09-19 ENCOUNTER — Encounter: Payer: Medicare Other | Attending: Internal Medicine

## 2016-09-19 ENCOUNTER — Ambulatory Visit: Payer: Medicare Other

## 2016-09-19 DIAGNOSIS — Z48812 Encounter for surgical aftercare following surgery on the circulatory system: Secondary | ICD-10-CM | POA: Diagnosis present

## 2016-09-19 DIAGNOSIS — Z951 Presence of aortocoronary bypass graft: Secondary | ICD-10-CM

## 2016-09-19 NOTE — Progress Notes (Signed)
Daily Session Note  Patient Details  Name: Craig [name] MRN: [id number] Date of Birth: 07-25-1945 Referring Provider:     Cardiac Rehab from 09/12/2016 in [facility] Cardiac and Pulmonary Rehab  Referring Provider  [facility]      Encounter Date: 09/19/2016  Check In:     Session Check In - 09/19/16 0852      Check-In   Location [facility]-Cardiac & Pulmonary Rehab   Staff Present Alberteen Sam, MA, ACSM RCEP, Exercise Physiologist;Nataniel Gasper Oletta Darter, BA, ACSM CEP, Exercise Physiologist;Meredith Sherryll Burger, RN BSN   Supervising physician immediately available to respond to emergencies See telemetry face sheet for immediately available ER MD   Medication changes reported     No   Fall or balance concerns reported    No   Warm-up and Cool-down Performed on first and last piece of equipment   Resistance Training Performed Yes   VAD Patient? No     Pain Assessment   Currently in Pain? No/denies         History  Smoking Status  . Former Smoker  . Packs/day: 1.50  . Years: 20.00  . Quit date: 09/25/1983  Smokeless Tobacco  . Never Used    Goals Met:  Independence with exercise equipment Exercise tolerated well No report of cardiac concerns or symptoms Strength training completed today  Goals Unmet:  Not Applicable  Comments: Pt able to follow exercise prescription today without complaint.  Will continue to monitor for progression.    Dr. Emily Filbert is Medical Director for [place] and [facility].

## 2016-09-23 ENCOUNTER — Ambulatory Visit: Payer: Medicare Other

## 2016-09-25 ENCOUNTER — Ambulatory Visit: Payer: Medicare Other

## 2016-09-26 ENCOUNTER — Encounter: Payer: Medicare Other | Admitting: *Deleted

## 2016-09-26 DIAGNOSIS — Z951 Presence of aortocoronary bypass graft: Secondary | ICD-10-CM

## 2016-09-26 DIAGNOSIS — Z48812 Encounter for surgical aftercare following surgery on the circulatory system: Secondary | ICD-10-CM | POA: Diagnosis not present

## 2016-09-26 NOTE — Progress Notes (Signed)
Daily Session Note  Patient Details  Name: Craig [name] MRN: [id number] Date of Birth: Mar 25, 1945 Referring Provider:     Cardiac Rehab from 09/12/2016 in [facility] Cardiac and Pulmonary Rehab  Referring Provider  [facility]      Encounter Date: 09/26/2016  Check In:     Session Check In - 09/26/16 0819      Check-In   Location [facility]-Cardiac & Pulmonary Rehab   Staff Present Alberteen Sam, MA, ACSM RCEP, Exercise Physiologist;Krista Frederico Hamman, RN BSN;Meredith Sherryll Burger, RN BSN   Supervising physician immediately available to respond to emergencies See telemetry face sheet for immediately available ER MD   Medication changes reported     No   Fall or balance concerns reported    No   Warm-up and Cool-down Performed on first and last piece of equipment   Resistance Training Performed No  left early for appointment   VAD Patient? No     Pain Assessment   Currently in Pain? No/denies   Multiple Pain Sites No         History  Smoking Status  . Former Smoker  . Packs/day: 1.50  . Years: 20.00  . Quit date: 09/25/1983  Smokeless Tobacco  . Never Used    Goals Met:  Independence with exercise equipment Exercise tolerated well No report of cardiac concerns or symptoms  Goals Unmet:  Not Applicable  Comments: Pt able to follow exercise prescription today without complaint.  Will continue to monitor for progression.    Dr. Emily Filbert is Medical Director for [place] and [facility].

## 2016-09-27 ENCOUNTER — Ambulatory Visit: Payer: Medicare Other | Admitting: Urology

## 2016-10-01 ENCOUNTER — Encounter: Payer: Medicare Other | Admitting: *Deleted

## 2016-10-01 DIAGNOSIS — Z951 Presence of aortocoronary bypass graft: Secondary | ICD-10-CM

## 2016-10-01 DIAGNOSIS — Z48812 Encounter for surgical aftercare following surgery on the circulatory system: Secondary | ICD-10-CM | POA: Diagnosis not present

## 2016-10-01 NOTE — Progress Notes (Signed)
Daily Session Note  Patient Details  Name: Craig [name] MRN: [id number] Date of Birth: Mar 05, 1945 Referring Provider:     Cardiac Rehab from 09/12/2016 in [facility] Cardiac and Pulmonary Rehab  Referring Provider  [facility]      Encounter Date: 10/01/2016  Check In:     Session Check In - 10/01/16 0827      Check-In   Location [facility]-Cardiac & Pulmonary Rehab   Staff Present Heath Lark, RN, BSN, CCRP;Aveion Nguyen [place], MA, ACSM RCEP, Exercise Physiologist;Amanda Oletta Darter, [place], ACSM CEP, Exercise Physiologist   Supervising physician immediately available to respond to emergencies See telemetry face sheet for immediately available ER MD   Medication changes reported     No   Fall or balance concerns reported    No   Warm-up and Cool-down Performed on first and last piece of equipment   Resistance Training Performed Yes   VAD Patient? No     Pain Assessment   Currently in Pain? No/denies   Multiple Pain Sites No         History  Smoking Status  . Former Smoker  . Packs/day: 1.50  . Years: 20.00  . Quit date: 09/25/1983  Smokeless Tobacco  . Never Used    Goals Met:  Independence with exercise equipment Exercise tolerated well No report of cardiac concerns or symptoms Strength training completed today  Goals Unmet:  Not Applicable  Comments: Pt able to follow exercise prescription today without complaint.  Will continue to monitor for progression. Reviewed home exercise with pt today.  Pt plans to do chair exercises at home for exercise.  His walking ability is limited by his knee. Reviewed THR, pulse, RPE, sign and symptoms, NTG use, and when to call 911 or MD.  Also discussed weather considerations and indoor options.  Pt voiced understanding.    Dr. Emily Filbert is Medical Director for [place] and [facility].

## 2016-10-02 ENCOUNTER — Encounter: Payer: Self-pay | Admitting: *Deleted

## 2016-10-02 ENCOUNTER — Ambulatory Visit (INDEPENDENT_AMBULATORY_CARE_PROVIDER_SITE_OTHER): Payer: Medicare Other | Admitting: Urology

## 2016-10-02 VITALS — BP 154/87 | Ht 71.0 in | Wt 230.0 lb

## 2016-10-02 DIAGNOSIS — N261 Atrophy of kidney (terminal): Secondary | ICD-10-CM

## 2016-10-02 DIAGNOSIS — I200 Unstable angina: Secondary | ICD-10-CM

## 2016-10-02 DIAGNOSIS — Z951 Presence of aortocoronary bypass graft: Secondary | ICD-10-CM

## 2016-10-02 DIAGNOSIS — N401 Enlarged prostate with lower urinary tract symptoms: Secondary | ICD-10-CM

## 2016-10-02 DIAGNOSIS — N138 Other obstructive and reflux uropathy: Secondary | ICD-10-CM

## 2016-10-02 DIAGNOSIS — R350 Frequency of micturition: Secondary | ICD-10-CM | POA: Diagnosis not present

## 2016-10-02 LAB — BLADDER SCAN AMB NON-IMAGING: Scan Result: 0

## 2016-10-02 NOTE — Progress Notes (Signed)
10/02/2016 3:06 PM   Craig [name] 11/25/1945 [id number]  Referring provider: Tracie Harrier, MD [address] [facility] [place], [place] [address]  Chief Complaint  Patient presents with  . 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 He has no significant deficits. He is currently in cardiac rehabilitation.  Today, he continues to have severe urinary urgency and frequency which is up to every 20 minutes. He also gets up numerous times at night to void.  He does also complain of some mild intermittent dysuria. UA today is unremarkable. No gross hematuria.  He remains on both finasteride and Flomax.    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 Surgery was uncomplicated. 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 No malignancy was identified.     PMH: Past Medical History:  Diagnosis Date  . Anginal pain ([place])   . BPH (benign prostatic hyperplasia)   . Cancer ([place])    skin cancer   . Chronic kidney disease   . GERD (gastroesophageal reflux disease)   . History of hiatal hernia   . Hypercholesterolemia   . Hypertension     Surgical History: Past Surgical History:  Procedure Laterality Date  . IR GENERIC HISTORICAL  09/29/2015   IR NEPHROSTOMY PLACEMENT LEFT 09/29/2015 [name], MD [facility]INTERV RAD  . IR GENERIC HISTORICAL  10/20/2015   IR NEPHROSTOGRAM LEFT THRU EXISTING ACCESS 10/20/2015 [facility]INTER[facility]  . JOINT REPLACEMENT    . LAPAROSCOPIC NEPHRECTOMY,  HAND ASSISTED Left 11/07/2015   Procedure: HAND ASSISTED LAPAROSCOPIC NEPHRECTOMY;  Surgeon: Hollice Espy, MD;  Location: [facility] ORS;  Service: Urology;  Laterality: Left;  . LEFT HEART CATH AND CORONARY ANGIOGRAPHY N/A 06/28/2016   Procedure: Left Heart Cath and Coronary Angiography;  Surgeon: Yolonda Kida, MD;  Location: [facility];  Service: Cardiovascular;  Laterality: N/A;  . PROSTATE ABLATION N/A 04/10/2016   Procedure: PROSTATE ABLATION;  Surgeon: Hollice Espy, MD;  Location: [facility] ORS;  Service: Urology;  Laterality: N/A;  . REPLACEMENT TOTAL KNEE Right   . THULIUM LASER TURP (TRANSURETHRAL RESECTION OF PROSTATE) N/A 04/10/2016   Procedure: THULIUM LASER TURP (TRANSURETHRAL RESECTION OF PROSTATE);  Surgeon: Hollice Espy, MD;  Location: [facility] ORS;  Service: Urology;  Laterality: N/A;  . TONSILLECTOMY      Home Medications:  Allergies as of 10/02/2016      Reactions   Peanut Oil Other (See Comments)   Mouth ulcers   Peanut-containing Drug Products Other (See Comments)   Mouth ulcers       Medication List       Accurate as of 10/02/16 11:59 PM. Always use your most recent med list.          amiodarone 200 MG tablet Commonly known as:  PACERONE Take by mouth.   apixaban 5 MG Tabs tablet Commonly known as:  ELIQUIS Take by mouth.   aspirin EC 81 MG tablet Take 81 mg by mouth daily.   atorvastatin 80 MG tablet Commonly known as:  LIPITOR Take by mouth.   cetirizine 10 MG tablet Commonly  known as:  ZYRTEC Take 10 mg by mouth daily.   docusate sodium 100 MG capsule Commonly known as:  COLACE Take 1 capsule (100 mg total) by mouth 2 (two) times daily.   finasteride 5 MG tablet Commonly known as:  PROSCAR Take by mouth.   FLUoxetine 20 MG capsule Commonly known as:  PROZAC Take by mouth.   fluticasone 50 MCG/ACT nasal spray Commonly known as:  FLONASE Place 2 sprays into both nostrils daily.   gabapentin 100 MG capsule Commonly known as:   NEURONTIN Take by mouth.   isosorbide mononitrate 30 MG 24 hr tablet Commonly known as:  IMDUR   lisinopril 30 MG tablet Commonly known as:  PRINIVIL,ZESTRIL Take 30 mg by mouth every morning.   metoprolol succinate 25 MG 24 hr tablet Commonly known as:  TOPROL-XL   multivitamin tablet Take 1 tablet by mouth daily.   omeprazole 40 MG capsule Commonly known as:  PRILOSEC Take 40 mg by mouth 2 (two) times daily.   simvastatin 40 MG tablet Commonly known as:  ZOCOR Take 40 mg by mouth daily at 6 PM.   tamsulosin 0.4 MG Caps capsule Commonly known as:  FLOMAX Take 0.4 mg by mouth daily.   tiZANidine 2 MG tablet Commonly known as:  ZANAFLEX every 8 (eight) hours as needed.   traMADol 50 MG tablet Commonly known as:  ULTRAM Take 50 mg by mouth every 6 (six) hours as needed for moderate pain.   vitamin E 100 UNIT capsule Take 100 Units by mouth daily.       Allergies:  Allergies  Allergen Reactions  . Peanut Oil Other (See Comments)    Mouth ulcers   . Peanut-Containing Drug Products Other (See Comments)    Mouth ulcers     Family History: Family History  Problem Relation Age of Onset  . Kidney cancer Mother   . Lung cancer Father   . Prostate cancer Neg Hx   . Bladder Cancer Neg Hx     Social History:  reports that he quit smoking about 33 years ago. He has a 30.00 pack-year smoking history. He has never used smokeless tobacco. He reports that he does not drink alcohol or use drugs.  ROS: UROLOGY Frequent Urination?: Yes Hard to postpone urination?: Yes Burning/pain with urination?: No Get up at night to urinate?: Yes Leakage of urine?: Yes Urine stream starts and stops?: Yes Trouble starting stream?: Yes Do you have to strain to urinate?: No Blood in urine?: No Urinary tract infection?: No Sexually transmitted disease?: No Injury to kidneys or bladder?: No Painful intercourse?: No Weak stream?: No Erection problems?: No Penile pain?:  No  Gastrointestinal Nausea?: No Vomiting?: No Indigestion/heartburn?: No Diarrhea?: No Constipation?: No  Constitutional Fever: No Night sweats?: No Weight loss?: No Fatigue?: No  Skin Skin rash/lesions?: No Itching?: No  Eyes Blurred vision?: No Double vision?: No  Ears/Nose/Throat Sore throat?: No Sinus problems?: No  Hematologic/Lymphatic Swollen glands?: No Easy bruising?: No  Cardiovascular Leg swelling?: No Chest pain?: No  Respiratory Cough?: No Shortness of breath?: No  Endocrine Excessive thirst?: No  Musculoskeletal Back pain?: No Joint pain?: No  Neurological Headaches?: No Dizziness?: No  Psychologic Depression?: No Anxiety?: No  Physical Exam: BP (!) 154/87 (BP Location: Left Arm, Patient Position: Sitting, Cuff Size: Large)   Ht 5\' 11"  (1.803 m)   Wt 230 lb (104.3 kg)   BMI 32.08 kg/m   Constitutional:  Alert and oriented, No acute distress.  HEENT: [place] AT, moist mucus membranes.  Trachea midline, no masses. Cardiovascular: No clubbing, cyanosis, or edema.  Respiratory: Normal respiratory effort, no increased work of breathing.  GI: Abdomen is soft, nontender, nondistended, no abdominal masses Skin: No rashes, bruises or suspicious lesions. Neurologic: Grossly intact, no focal deficits, moving all 4 extremities. Psychiatric: Normal mood and affect.  Laboratory Data: Lab Results  Component Value Date   WBC 14.0 (H) 05/28/2016   HGB 14.1 05/28/2016   HCT 42.0 05/28/2016   MCV 90.7 05/28/2016   PLT 181 05/28/2016    Lab Results  Component Value Date   CREATININE 1.36 (H) 05/28/2016   Results for orders placed or performed in visit on 10/02/16  Microscopic Examination  Result Value Ref Range   WBC, UA 0-5 0 - 5 /hpf   RBC, UA None seen 0 - 2 /hpf   Epithelial Cells (non renal) None seen 0 - 10 /hpf   Bacteria, UA None seen None seen/Few  Urinalysis, Complete  Result Value Ref Range   Specific Gravity, UA 1.020  1.005 - 1.030   pH, UA 6.5 5.0 - 7.5   Color, UA Yellow Yellow   Appearance Ur Clear Clear   Leukocytes, UA Trace (A) Negative   Protein, UA Negative Negative/Trace   Glucose, UA Negative Negative   Ketones, UA Negative Negative   RBC, UA Negative Negative   Bilirubin, UA Negative Negative   Urobilinogen, Ur 0.2 0.2 - 1.0 mg/dL   Nitrite, UA Negative Negative   Microscopic Examination See below:   Bladder Scan (Post Void Residual) in office  Result Value Ref Range   Scan Result 0     Assessment & Plan:    1. BPH with obstruction/lower urinary tract symptoms, urinary frequency s/p laser ablation of the prostate on 04/10/2016 Continues on finasteride and Flomax Urinary symptoms primarily irritative UA today negative, no evidence of infection is concerning factor Symptoms possibly exacerbated by recent stroke Given samples of Mybetriq 25 mg 4 weeks today, discussed possible side effects  2. History of urinary retention As above  PVR today minimal  3. 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  Hollice Espy, MD  [facility] [address] [place], [place], [place] [address] [phone]

## 2016-10-02 NOTE — Progress Notes (Signed)
Cardiac Individual Treatment Plan  Patient Details  Name: Craig [name] MRN: [id number] Date of Birth: [birth year] Referring Provider:     Cardiac Rehab from 09/12/2016 in [facility] Cardiac and Pulmonary Rehab  Referring Provider  [facility]      Initial Encounter Date:    Cardiac Rehab from 09/12/2016 in [facility] Cardiac and Pulmonary Rehab  Date  09/12/16  Referring Provider  [facility]      Visit Diagnosis: S/P CABG x 3  Patient's Home Medications on Admission:  Current Outpatient Prescriptions:  .  amiodarone (PACERONE) 200 MG tablet, Take by mouth., Disp: , Rfl:  .  apixaban (ELIQUIS) 5 MG TABS tablet, Take by mouth., Disp: , Rfl:  .  aspirin EC 81 MG tablet, Take 81 mg by mouth daily., Disp: , Rfl:  .  atorvastatin (LIPITOR) 80 MG tablet, Take by mouth., Disp: , Rfl:  .  cetirizine (ZYRTEC) 10 MG tablet, Take 10 mg by mouth daily., Disp: , Rfl:  .  cyanocobalamin (,VITAMIN B-12,) 1000 MCG/ML injection, Inject into the muscle., Disp: , Rfl:  .  diclofenac sodium (VOLTAREN) 1 % GEL, Apply topically., Disp: , Rfl:  .  docusate sodium (COLACE) 100 MG capsule, Take 1 capsule (100 mg total) by mouth 2 (two) times daily. (Patient not taking: Reported on 09/12/2016), Disp: 60 capsule, Rfl: 0 .  finasteride (PROSCAR) 5 MG tablet, Take by mouth., Disp: , Rfl:  .  FLUoxetine (PROZAC) 20 MG capsule, Take by mouth., Disp: , Rfl:  .  fluticasone (FLONASE) 50 MCG/ACT nasal spray, Place 2 sprays into both nostrils daily., Disp: , Rfl: 3 .  gabapentin (NEURONTIN) 100 MG capsule, Take by mouth., Disp: , Rfl:  .  isosorbide mononitrate (IMDUR) 30 MG 24 hr tablet, , Disp: , Rfl: 4 .  lisinopril (PRINIVIL,ZESTRIL) 30 MG tablet, Take 30 mg by mouth every morning. , Disp: , Rfl: 4 .  metoprolol succinate (TOPROL-XL) 25 MG 24 hr tablet, , Disp: , Rfl: 10 .  Multiple Vitamin (MULTIVITAMIN) tablet, Take 1 tablet by mouth daily., Disp: , Rfl:  .  omeprazole (PRILOSEC) 40 MG capsule, Take 40 mg by mouth 2 (two)  times daily., Disp: , Rfl: 10 .  simvastatin (ZOCOR) 40 MG tablet, Take 40 mg by mouth daily at 6 PM. , Disp: , Rfl: 5 .  tamsulosin (FLOMAX) 0.4 MG CAPS capsule, Take 0.4 mg by mouth daily., Disp: , Rfl: 3 .  tiZANidine (ZANAFLEX) 2 MG tablet, every 8 (eight) hours as needed. , Disp: , Rfl: 0 .  traMADol (ULTRAM) 50 MG tablet, Take 50 mg by mouth every 6 (six) hours as needed for moderate pain. , Disp: , Rfl: 0 .  vitamin E 100 UNIT capsule, Take 100 Units by mouth daily., Disp: , Rfl:   Past Medical History: Past Medical History:  Diagnosis Date  . Anginal pain ([place])   . BPH (benign prostatic hyperplasia)   . Cancer ([place])    skin cancer   . Chronic kidney disease   . GERD (gastroesophageal reflux disease)   . History of hiatal hernia   . Hypercholesterolemia   . Hypertension     Tobacco Use: History  Smoking Status  . Former Smoker  . Packs/day: 1.50  . Years: 20.00  . Quit date: 09/25/1983  Smokeless Tobacco  . Never Used    Labs: Recent Review Flowsheet Data    There is no flowsheet data to display.       Exercise Target Goals:  Exercise Program Goal: Individual exercise prescription set with THRR, safety & activity barriers. Participant demonstrates ability to understand and report RPE using BORG scale, to self-measure pulse accurately, and to acknowledge the importance of the exercise prescription.  Exercise Prescription Goal: Starting with aerobic activity 30 plus minutes a day, 3 days per week for initial exercise prescription. Provide home exercise prescription and guidelines that participant acknowledges understanding prior to discharge.  Activity Barriers & Risk Stratification:     Activity Barriers & Cardiac Risk Stratification - 09/12/16 1354      Activity Barriers & Cardiac Risk Stratification   Activity Barriers Left Knee Replacement  waiting for knee replacement   Cardiac Risk Stratification High      6 Minute Walk:     6 Minute Walk     Row Name 09/12/16 1423         6 Minute Walk   Phase Initial     Distance 795 feet     Walk Time 6 minutes     # of Rest Breaks 0     MPH 1.5     METS 1.8     RPE 13     VO2 Peak 6.2     Symptoms Yes (comment)     Comments knee pain 4/10 L knee     Resting HR 58 bpm     Resting BP 122/84     Max Ex. HR 94 bpm     Max Ex. BP 148/86     2 Minute Post BP 134/82        Oxygen Initial Assessment:   Oxygen Re-Evaluation:   Oxygen Discharge (Final Oxygen Re-Evaluation):   Initial Exercise Prescription:     Initial Exercise Prescription - 09/12/16 1400      Date of Initial Exercise RX and Referring Provider   Date 09/12/16   Referring Provider [facility]     Treadmill   MPH 1.5   Grade 0   Minutes 15   METs 1.8     NuStep   Level 2   SPM 80   Minutes 15   METs 1.8     Arm Ergometer   Level 1   RPM 50   Minutes 15   METs 1.5     Prescription Details   Frequency (times per week) 3   Duration Progress to 45 minutes of aerobic exercise without signs/symptoms of physical distress     Intensity   THRR 40-80% of Max Heartrate 94-131   Ratings of Perceived Exertion 11-13   Perceived Dyspnea 0-4     Resistance Training   Training Prescription Yes   Weight 3 lb   Reps 10-15      Perform Capillary Blood Glucose checks as needed.  Exercise Prescription Changes:     Exercise Prescription Changes    Row Name 09/12/16 1400 09/26/16 1500 10/01/16 0900         Response to Exercise   Blood Pressure (Admit) 122/84 144/82  -     Blood Pressure (Exercise) 148/86 132/64  -     Blood Pressure (Exit) 134/82 112/62  -     Heart Rate (Admit) 58 bpm 53 bpm  -     Heart Rate (Exercise) 94 bpm 68 bpm  -     Heart Rate (Exit) 55 bpm 52 bpm  -     Oxygen Saturation (Admit) 97 %  -  -     Oxygen Saturation (Exercise) 98 %  -  -  Rating of Perceived Exertion (Exercise) 13 14  -     Symptoms  - pain in hips on treadmill  -     Duration  - Continue with 45 min of  aerobic exercise without signs/symptoms of physical distress.  -     Intensity  - THRR unchanged  -       Progression   Progression  - Continue to progress workloads to maintain intensity without signs/symptoms of physical distress.  -     Average METs  - 1.66  -       Resistance Training   Training Prescription  - Yes  -     Weight  - 3 lb  -     Reps  - 10-15  -       Interval Training   Interval Training  - No  -       Treadmill   MPH  - 1  -     Minutes  - 15  -     METs  - 1.77  -       NuStep   Level  - 1  -     Minutes  - 15  -     METs  - 1.7  -       Arm Ergometer   Level  - 1  -     Minutes  - 15  -     METs  - 1.5  -       Home Exercise Plan   Plans to continue exercise at  -  - Home (comment)  walking and chair exercises     Frequency  -  - Add 2 additional days to program exercise sessions.     Initial Home Exercises Provided  -  - 10/01/16        Exercise Comments:     Exercise Comments    Row Name 09/17/16 0950           Exercise Comments First full day of exercise!  Patient was oriented to gym and equipment including functions, settings, policies, and procedures.  Patient's individual exercise prescription and treatment plan were reviewed.  All starting workloads were established based on the results of the 6 minute walk test done at initial orientation visit.  The plan for exercise progression was also introduced and progression will be customized based on patient's performance and goals.          Exercise Goals and Review:     Exercise Goals    Row Name 09/12/16 1423             Exercise Goals   Increase Physical Activity Yes       Intervention Provide advice, education, support and counseling about physical activity/exercise needs.;Develop an individualized exercise prescription for aerobic and resistive training based on initial evaluation findings, risk stratification, comorbidities and participant's personal goals.       Expected  Outcomes Achievement of increased cardiorespiratory fitness and enhanced flexibility, muscular endurance and strength shown through measurements of functional capacity and personal statement of participant.       Increase Strength and Stamina Yes       Intervention Provide advice, education, support and counseling about physical activity/exercise needs.;Develop an individualized exercise prescription for aerobic and resistive training based on initial evaluation findings, risk stratification, comorbidities and participant's personal goals.       Expected Outcomes Achievement of increased cardiorespiratory fitness and enhanced flexibility, muscular endurance and strength  shown through measurements of functional capacity and personal statement of participant.          Exercise Goals Re-Evaluation :     Exercise Goals Re-Evaluation    Row Name 09/26/16 1556 10/01/16 0948           Exercise Goal Re-Evaluation   Exercise Goals Review Increase Physical Activity;Increase Strenth and Stamina Increase Physical Activity;Increase Strenth and Stamina      Comments [name] is off to a good start in rehab.  He has had 3 full days of exercise.  He still struggles on the treadmill with his hips.  He wanted to stay on the treadmill versus walking on the track.  We will continue to work with him on his progression.   Reviewed home exercise with pt today.  Pt plans to do chair exercises at home for exercise.  His walking ability is limited by his knee. Reviewed THR, pulse, RPE, sign and symptoms, NTG use, and when to call 911 or MD.  Also discussed weather considerations and indoor options.  Pt voiced understanding.      Expected Outcomes Short: Continue to walk on treadmill.  Long: Improve walking ability.  Short: Add in chair exercises at home.  Long: Exercise regularly.         Discharge Exercise Prescription (Final Exercise Prescription Changes):     Exercise Prescription Changes - 10/01/16 0900      Home  Exercise Plan   Plans to continue exercise at Home (comment)  walking and chair exercises   Frequency Add 2 additional days to program exercise sessions.   Initial Home Exercises Provided 10/01/16      Nutrition:  Target Goals: Understanding of nutrition guidelines, daily intake of sodium 1500mg , cholesterol 200mg , calories 30% from fat and 7% or less from saturated fats, daily to have 5 or more servings of fruits and vegetables.  Biometrics:     Pre Biometrics - 09/12/16 1422      Pre Biometrics   Height 5' 10.25" (1.784 m)   Weight 231 lb 9.6 oz (105.1 kg)   Waist Circumference 43.75 inches   Hip Circumference 46 inches   Waist to Hip Ratio 0.95 %   BMI (Calculated) 33.1   Single Leg Stand 13.27 seconds       Nutrition Therapy Plan and Nutrition Goals:   Nutrition Discharge: Rate Your Plate Scores:     Nutrition Assessments - 09/12/16 1334      MEDFICTS Scores   Pre Score 58      Nutrition Goals Re-Evaluation:   Nutrition Goals Discharge (Final Nutrition Goals Re-Evaluation):   Psychosocial: Target Goals: Acknowledge presence or absence of significant depression and/or stress, maximize coping skills, provide positive support system. Participant is able to verbalize types and ability to use techniques and skills needed for reducing stress and depression.   Initial Review & Psychosocial Screening:     Initial Psych Review & Screening - 09/12/16 1345      Initial Review   Current issues with None Identified     Family Dynamics   Good Support System? Yes  wife     Barriers   Psychosocial barriers to participate in program There are no identifiable barriers or psychosocial needs.     Screening Interventions   Interventions Encouraged to exercise;Program counselor consult      Quality of Life Scores:      Quality of Life - 09/12/16 1347      Quality of Life Scores   Health/Function  Pre 20.79 %   Socioeconomic Pre 21 %   Psych/Spiritual Pre  21 %   Family Pre 21 %   GLOBAL Pre 20.91 %      PHQ-9: Recent Review Flowsheet Data    Depression screen [facility] 2/9 09/12/2016   Decreased Interest 2   Down, Depressed, Hopeless 0   PHQ - 2 Score 2   Altered sleeping 1   Tired, decreased energy 3   Change in appetite 0   Feeling bad or failure about yourself  0   Trouble concentrating 0   Moving slowly or fidgety/restless 1   Suicidal thoughts 0   PHQ-9 Score 7   Difficult doing work/chores Somewhat difficult     Interpretation of Total Score  Total Score Depression Severity:  1-4 = Minimal depression, 5-9 = Mild depression, 10-14 = Moderate depression, 15-19 = Moderately severe depression, 20-27 = Severe depression   Psychosocial Evaluation and Intervention:   Psychosocial Re-Evaluation:   Psychosocial Discharge (Final Psychosocial Re-Evaluation):   Vocational Rehabilitation: Provide vocational rehab assistance to qualifying candidates.   Vocational Rehab Evaluation & Intervention:     Vocational Rehab - 09/12/16 1353      Initial Vocational Rehab Evaluation & Intervention   Assessment shows need for Vocational Rehabilitation No      Education: Education Goals: Education classes will be provided on a weekly basis, covering required topics. Participant will state understanding/return demonstration of topics presented.  Learning Barriers/Preferences:     Learning Barriers/Preferences - 09/12/16 1352      Learning Barriers/Preferences   Learning Barriers None   Learning Preferences Verbal Instruction      Education Topics: General Nutrition Guidelines/Fats and Fiber: -Group instruction provided by verbal, written material, models and posters to present the general guidelines for heart healthy nutrition. Gives an explanation and review of dietary fats and fiber.   Controlling Sodium/Reading Food Labels: -Group verbal and written material supporting the discussion of sodium use in heart healthy nutrition.  Review and explanation with models, verbal and written materials for utilization of the food label.   Exercise Physiology & Risk Factors: - Group verbal and written instruction with models to review the exercise physiology of the cardiovascular system and associated critical values. Details cardiovascular disease risk factors and the goals associated with each risk factor.   Aerobic Exercise & Resistance Training: - Gives group verbal and written discussion on the health impact of inactivity. On the components of aerobic and resistive training programs and the benefits of this training and how to safely progress through these programs.   Flexibility, Balance, General Exercise Guidelines: - Provides group verbal and written instruction on the benefits of flexibility and balance training programs. Provides general exercise guidelines with specific guidelines to those with heart or lung disease. Demonstration and skill practice provided.   Stress Management: - Provides group verbal and written instruction about the health risks of elevated stress, cause of high stress, and healthy ways to reduce stress.   Depression: - Provides group verbal and written instruction on the correlation between heart/lung disease and depressed mood, treatment options, and the stigmas associated with seeking treatment.   Anatomy & Physiology of the Heart: - Group verbal and written instruction and models provide basic cardiac anatomy and physiology, with the coronary electrical and arterial systems. Review of: AMI, Angina, Valve disease, Heart Failure, Cardiac Arrhythmia, Pacemakers, and the ICD.   Cardiac Procedures: - Group verbal and written instruction and models to describe the testing methods done to diagnose  heart disease. Reviews the outcomes of the test results. Describes the treatment choices: Medical Management, Angioplasty, or Coronary Bypass Surgery.   Cardiac Rehab from 10/01/2016 in [facility] Cardiac  and Pulmonary Rehab  Date  09/17/16  Educator  CE  Instruction Review Code  2- meets goals/outcomes      Cardiac Medications: - Group verbal and written instruction to review commonly prescribed medications for heart disease. Reviews the medication, class of the drug, and side effects. Includes the steps to properly store meds and maintain the prescription regimen.   Cardiac Rehab from 10/01/2016 in [facility] Cardiac and Pulmonary Rehab  Date  09/26/16 [8/9 Part Two]  Educator  [facility]  Instruction Review Code  2- meets goals/outcomes      Go Sex-Intimacy & Heart Disease, Get SMART - Goal Setting: - Group verbal and written instruction through game format to discuss heart disease and the return to sexual intimacy. Provides group verbal and written material to discuss and apply goal setting through the application of the S.M.A.R.T. Method.   Cardiac Rehab from 10/01/2016 in [facility] Cardiac and Pulmonary Rehab  Date  09/17/16  Educator  CE  Instruction Review Code  2- meets goals/outcomes      Other Matters of the Heart: - Provides group verbal, written materials and models to describe Heart Failure, Angina, Valve Disease, and Diabetes in the realm of heart disease. Includes description of the disease process and treatment options available to the cardiac patient.   Exercise & Equipment Safety: - Individual verbal instruction and demonstration of equipment use and safety with use of the equipment.   Cardiac Rehab from 10/01/2016 in [facility] Cardiac and Pulmonary Rehab  Date  09/12/16  Educator  [facility]  Instruction Review Code  2- meets goals/outcomes      Infection Prevention: - Provides verbal and written material to individual with discussion of infection control including proper hand washing and proper equipment cleaning during exercise session.   Cardiac Rehab from 10/01/2016 in [facility] Cardiac and Pulmonary Rehab  Date  09/12/16  Educator  [facility]  Instruction Review Code  2- meets goals/outcomes       Falls Prevention: - Provides verbal and written material to individual with discussion of falls prevention and safety.   Cardiac Rehab from 10/01/2016 in [facility] Cardiac and Pulmonary Rehab  Date  09/12/16  Educator  [facility]  Instruction Review Code  2- meets goals/outcomes      Diabetes: - Individual verbal and written instruction to review signs/symptoms of diabetes, desired ranges of glucose level fasting, after meals and with exercise. Advice that pre and post exercise glucose checks will be done for 3 sessions at entry of program.    Knowledge Questionnaire Score:     Knowledge Questionnaire Score - 09/12/16 1352      Knowledge Questionnaire Score   Pre Score 20/28  correct answers reviewed with [name]      Core Components/Risk Factors/Patient Goals at Admission:     Personal Goals and Risk Factors at Admission - 09/12/16 1333      Core Components/Risk Factors/Patient Goals on Admission   Hypertension Yes   Intervention Provide education on lifestyle modifcations including regular physical activity/exercise, weight management, moderate sodium restriction and increased consumption of fresh fruit, vegetables, and low fat dairy, alcohol moderation, and smoking cessation.;Monitor prescription use compliance.   Expected Outcomes Short Term: Continued assessment and intervention until BP is < 140/30mm HG in hypertensive participants. < 130/48mm HG in hypertensive participants with diabetes, heart failure or chronic kidney  disease.;Long Term: Maintenance of blood pressure at goal levels.   Lipids Yes   Intervention Provide education and support for participant on nutrition & aerobic/resistive exercise along with prescribed medications to achieve LDL 70mg , HDL >40mg .   Expected Outcomes Short Term: Participant states understanding of desired cholesterol values and is compliant with medications prescribed. Participant is following exercise prescription and nutrition guidelines.;Long Term:  Cholesterol controlled with medications as prescribed, with individualized exercise RX and with personalized nutrition plan. Value goals: LDL < 70mg , HDL > 40 mg.   Stress Yes   Intervention Offer individual and/or small group education and counseling on adjustment to heart disease, stress management and health-related lifestyle change. Teach and support self-help strategies.;Refer participants experiencing significant psychosocial distress to appropriate mental health specialists for further evaluation and treatment. When possible, include family members and significant others in education/counseling sessions.   Expected Outcomes Short Term: Participant demonstrates changes in health-related behavior, relaxation and other stress management skills, ability to obtain effective social support, and compliance with psychotropic medications if prescribed.;Long Term: Emotional wellbeing is indicated by absence of clinically significant psychosocial distress or social isolation.      Core Components/Risk Factors/Patient Goals Review:    Core Components/Risk Factors/Patient Goals at Discharge (Final Review):    ITP Comments:     ITP Comments    Row Name 09/12/16 1325 10/02/16 0620         ITP Comments Med Review completed. Initial ITP created. Diagnosis can be found in [place] 5/16 30 day review. Continue with ITP unless directed changes per Medical Director review          Comments:

## 2016-10-03 DIAGNOSIS — Z48812 Encounter for surgical aftercare following surgery on the circulatory system: Secondary | ICD-10-CM | POA: Diagnosis not present

## 2016-10-03 DIAGNOSIS — Z951 Presence of aortocoronary bypass graft: Secondary | ICD-10-CM

## 2016-10-03 LAB — URINALYSIS, COMPLETE
BILIRUBIN UA: NEGATIVE
Glucose, UA: NEGATIVE
Ketones, UA: NEGATIVE
Nitrite, UA: NEGATIVE
PH UA: 6.5 (ref 5.0–7.5)
PROTEIN UA: NEGATIVE
RBC UA: NEGATIVE
SPEC GRAV UA: 1.02 (ref 1.005–1.030)
Urobilinogen, Ur: 0.2 mg/dL (ref 0.2–1.0)

## 2016-10-03 LAB — MICROSCOPIC EXAMINATION
BACTERIA UA: NONE SEEN
Epithelial Cells (non renal): NONE SEEN /hpf (ref 0–10)
RBC, UA: NONE SEEN /hpf (ref 0–?)

## 2016-10-03 NOTE — Progress Notes (Signed)
Daily Session Note  Patient Details  Name: Craig [name] MRN: [id number] Date of Birth: 10-21-1945 Referring Provider:     Cardiac Rehab from 09/12/2016 in [facility] Cardiac and Pulmonary Rehab  Referring Provider  [facility]      Encounter Date: 10/03/2016  Check In:     Session Check In - 10/03/16 0858      Check-In   Location [facility]-Cardiac & Pulmonary Rehab   Staff Present Alberteen Sam, MA, ACSM RCEP, Exercise Physiologist;Amanda Oletta Darter, BA, ACSM CEP, Exercise Physiologist;Meredith Sherryll Burger, RN BSN   Supervising physician immediately available to respond to emergencies See telemetry face sheet for immediately available ER MD   Medication changes reported     No   Fall or balance concerns reported    No   Warm-up and Cool-down Performed on first and last piece of equipment   Resistance Training Performed Yes   VAD Patient? No     Pain Assessment   Currently in Pain? No/denies         History  Smoking Status  . Former Smoker  . Packs/day: 1.50  . Years: 20.00  . Quit date: 09/25/1983  Smokeless Tobacco  . Never Used    Goals Met:  Independence with exercise equipment Exercise tolerated well No report of cardiac concerns or symptoms Strength training completed today  Goals Unmet:  Not Applicable  Comments: Pt able to follow exercise prescription today without complaint.  Will continue to monitor for progression.    Dr. Emily Filbert is Medical Director for [place] and [facility].

## 2016-10-08 DIAGNOSIS — Z48812 Encounter for surgical aftercare following surgery on the circulatory system: Secondary | ICD-10-CM | POA: Diagnosis not present

## 2016-10-08 DIAGNOSIS — Z951 Presence of aortocoronary bypass graft: Secondary | ICD-10-CM

## 2016-10-08 NOTE — Progress Notes (Signed)
Daily Session Note  Patient Details  Name: LINDA BIEHN MRN: [id number] Date of Birth: 1945-08-17 Referring Provider:     Cardiac Rehab from 09/12/2016 in [facility] Cardiac and Pulmonary Rehab  Referring Provider  [facility]      Encounter Date: 10/08/2016  Check In:     Session Check In - 10/08/16 0833      Check-In   Location [facility]-Cardiac & Pulmonary Rehab   Staff Present Heath Lark, RN, BSN, CCRP;Jessica Luan Pulling, MA, ACSM RCEP, Exercise Physiologist;Achaia Garlock Oletta Darter, [place], ACSM CEP, Exercise Physiologist   Supervising physician immediately available to respond to emergencies See telemetry face sheet for immediately available ER MD   Medication changes reported     No   Fall or balance concerns reported    No   Warm-up and Cool-down Performed on first and last piece of equipment   Resistance Training Performed Yes   VAD Patient? No     Pain Assessment   Currently in Pain? No/denies         History  Smoking Status  . Former Smoker  . Packs/day: 1.50  . Years: 20.00  . Quit date: 09/25/1983  Smokeless Tobacco  . Never Used    Goals Met:  Independence with exercise equipment Exercise tolerated well No report of cardiac concerns or symptoms Strength training completed today  Goals Unmet:  Not Applicable  Comments: Pt able to follow exercise prescription today without complaint.  Will continue to monitor for progression.    Dr. Emily Filbert is Medical Director for [place] and [facility].

## 2016-10-15 ENCOUNTER — Encounter: Payer: Medicare Other | Admitting: *Deleted

## 2016-10-15 DIAGNOSIS — Z48812 Encounter for surgical aftercare following surgery on the circulatory system: Secondary | ICD-10-CM | POA: Diagnosis not present

## 2016-10-15 DIAGNOSIS — Z951 Presence of aortocoronary bypass graft: Secondary | ICD-10-CM

## 2016-10-15 NOTE — Progress Notes (Signed)
Daily Session Note  Patient Details  Name: GARALD RHEW MRN: [id number] Date of Birth: Jan 30, 1946 Referring Provider:     Cardiac Rehab from 09/12/2016 in [facility] Cardiac and Pulmonary Rehab  Referring Provider  [facility]      Encounter Date: 10/15/2016  Check In:     Session Check In - 10/15/16 0842      Check-In   Location [facility]-Cardiac & Pulmonary Rehab   Staff Present Heath Lark, RN, BSN, CCRP;Jessica Luan Pulling, MA, ACSM RCEP, Exercise Physiologist;Amanda Oletta Darter, BA, ACSM CEP, Exercise Physiologist;Areil Ottey Flavia Shipper   Supervising physician immediately available to respond to emergencies See telemetry face sheet for immediately available ER MD   Medication changes reported     No   Fall or balance concerns reported    No   Warm-up and Cool-down Performed on first and last piece of equipment   Resistance Training Performed Yes   VAD Patient? No     Pain Assessment   Currently in Pain? No/denies   Multiple Pain Sites No         History  Smoking Status  . Former Smoker  . Packs/day: 1.50  . Years: 20.00  . Quit date: 09/25/1983  Smokeless Tobacco  . Never Used    Goals Met:  Independence with exercise equipment Exercise tolerated well No report of cardiac concerns or symptoms Strength training completed today  Goals Unmet:  Not Applicable  Comments: Pt able to follow exercise prescription today without complaint.  Will continue to monitor for progression.   Dr. Emily Filbert is Medical Director for [place] and [facility].

## 2016-10-16 ENCOUNTER — Telehealth: Payer: Self-pay

## 2016-10-16 ENCOUNTER — Ambulatory Visit (INDEPENDENT_AMBULATORY_CARE_PROVIDER_SITE_OTHER): Payer: Medicare Other

## 2016-10-16 VITALS — BP 155/96 | HR 62 | Ht 71.0 in | Wt 228.9 lb

## 2016-10-16 DIAGNOSIS — R350 Frequency of micturition: Secondary | ICD-10-CM | POA: Diagnosis not present

## 2016-10-16 LAB — BLADDER SCAN AMB NON-IMAGING: Scan Result: 30

## 2016-10-16 MED ORDER — MIRABEGRON ER 25 MG PO TB24: 25.0000 mg | ORAL_TABLET | Freq: Every day | ORAL | 11 refills | Status: DC

## 2016-10-16 NOTE — Telephone Encounter (Signed)
Pt PVR today was 30. He stated that medication samples provided were helping. Pt requested a script. Please advise.

## 2016-10-16 NOTE — Progress Notes (Signed)
Bladder Scan: 30 Patient can void:  Performed By: Toniann Fail, LPN   Blood pressure (!) 155/96, pulse 62, height 5\' 11"  (1.803 m), weight 228 lb 14.4 oz (103.8 kg).

## 2016-10-17 DIAGNOSIS — Z951 Presence of aortocoronary bypass graft: Secondary | ICD-10-CM

## 2016-10-17 DIAGNOSIS — Z48812 Encounter for surgical aftercare following surgery on the circulatory system: Secondary | ICD-10-CM | POA: Diagnosis not present

## 2016-10-17 NOTE — Progress Notes (Signed)
Daily Session Note  Patient Details  Name: Craig [name] MRN: [id number] Date of Birth: 1945-03-16 Referring Provider:     Cardiac Rehab from 09/12/2016 in [facility] Cardiac and Pulmonary Rehab  Referring Provider  [facility]      Encounter Date: 10/17/2016  Check In:     Session Check In - 10/17/16 0925      Check-In   Location [facility]-Cardiac & Pulmonary Rehab   Staff Present Alberteen Sam, MA, ACSM RCEP, Exercise Physiologist;Naw Lasala Oletta Darter, BA, ACSM CEP, Exercise Physiologist;Krista Frederico Hamman, RN BSN   Supervising physician immediately available to respond to emergencies See telemetry face sheet for immediately available ER MD   Medication changes reported     No   Fall or balance concerns reported    No   Warm-up and Cool-down Performed on first and last piece of equipment   Resistance Training Performed Yes   VAD Patient? No     VAD patient   Has back up controller? No     Pain Assessment   Currently in Pain? No/denies         History  Smoking Status  . Former Smoker  . Packs/day: 1.50  . Years: 20.00  . Quit date: 09/25/1983  Smokeless Tobacco  . Never Used    Goals Met:  Independence with exercise equipment Exercise tolerated well No report of cardiac concerns or symptoms Strength training completed today  Goals Unmet:  Not Applicable  Comments: Pt able to follow exercise prescription today without complaint.  Will continue to monitor for progression.    Dr. Emily Filbert is Medical Director for [place] and [facility].

## 2016-10-22 ENCOUNTER — Encounter: Payer: Self-pay | Admitting: Urology

## 2016-10-22 ENCOUNTER — Encounter: Payer: Medicare Other | Attending: Internal Medicine

## 2016-10-22 DIAGNOSIS — Z951 Presence of aortocoronary bypass graft: Secondary | ICD-10-CM | POA: Insufficient documentation

## 2016-10-22 DIAGNOSIS — Z48812 Encounter for surgical aftercare following surgery on the circulatory system: Secondary | ICD-10-CM | POA: Insufficient documentation

## 2016-10-22 NOTE — Progress Notes (Signed)
Daily Session Note  Patient Details  Name: Craig [name] MRN: [id number] Date of Birth: 01/22/1946 Referring Provider:     Cardiac Rehab from 09/12/2016 in [facility] Cardiac and Pulmonary Rehab  Referring Provider  [facility]      Encounter Date: 10/22/2016  Check In:     Session Check In - 10/22/16 1010      Check-In   Location [facility]-Cardiac & Pulmonary Rehab   Staff Present Nada Maclachlan, BA, ACSM CEP, Exercise Physiologist;Susanne Bice, RN, BSN, [place], MA, ACSM RCEP, Exercise Physiologist   Supervising physician immediately available to respond to emergencies See telemetry face sheet for immediately available ER MD   Medication changes reported     No   Fall or balance concerns reported    No   Warm-up and Cool-down Performed on first and last piece of equipment   Resistance Training Performed Yes   VAD Patient? No     VAD patient   Has back up controller? No     Pain Assessment   Currently in Pain? No/denies         History  Smoking Status  . Former Smoker  . Packs/day: 1.50  . Years: 20.00  . Quit date: 09/25/1983  Smokeless Tobacco  . Never Used    Goals Met:  Independence with exercise equipment Exercise tolerated well No report of cardiac concerns or symptoms Strength training completed today  Goals Unmet:  Not Applicable  Comments: Pt able to follow exercise prescription today without complaint.  Will continue to monitor for progression.    Dr. Emily Filbert is Medical Director for [place] and [facility].

## 2016-10-24 DIAGNOSIS — Z951 Presence of aortocoronary bypass graft: Secondary | ICD-10-CM

## 2016-10-24 DIAGNOSIS — Z48812 Encounter for surgical aftercare following surgery on the circulatory system: Secondary | ICD-10-CM | POA: Diagnosis not present

## 2016-10-24 NOTE — Progress Notes (Signed)
Daily Session Note  Patient Details  Name: Craig [name] MRN: [id number] Date of Birth: Oct 04, 1945 Referring Provider:     Cardiac Rehab from 09/12/2016 in [facility] Cardiac and Pulmonary Rehab  Referring Provider  [facility]      Encounter Date: 10/24/2016  Check In:     Session Check In - 10/24/16 0841      Check-In   Location [facility]-Cardiac & Pulmonary Rehab   Staff Present Alberteen Sam, MA, ACSM RCEP, Exercise Physiologist;Amanda Oletta Darter, BA, ACSM CEP, Exercise Physiologist;Meredith Sherryll Burger, RN BSN   Supervising physician immediately available to respond to emergencies See telemetry face sheet for immediately available ER MD   Medication changes reported     No   Fall or balance concerns reported    No   Warm-up and Cool-down Performed on first and last piece of equipment   Resistance Training Performed Yes   VAD Patient? No     VAD patient   Has back up controller? No     Pain Assessment   Currently in Pain? No/denies         History  Smoking Status  . Former Smoker  . Packs/day: 1.50  . Years: 20.00  . Quit date: 09/25/1983  Smokeless Tobacco  . Never Used    Goals Met:  Independence with exercise equipment Exercise tolerated well No report of cardiac concerns or symptoms Strength training completed today  Goals Unmet:  Not Applicable  Comments: Pt able to follow exercise prescription today without complaint.  Will continue to monitor for progression.    Dr. Emily Filbert is Medical Director for [place] and [facility].

## 2016-10-29 DIAGNOSIS — Z48812 Encounter for surgical aftercare following surgery on the circulatory system: Secondary | ICD-10-CM | POA: Diagnosis not present

## 2016-10-29 DIAGNOSIS — Z951 Presence of aortocoronary bypass graft: Secondary | ICD-10-CM

## 2016-10-29 NOTE — Progress Notes (Signed)
Daily Session Note  Patient Details  Name: DUNG SALINGER MRN: [id number] Date of Birth: 1945/04/12 Referring Provider:     Cardiac Rehab from 09/12/2016 in [facility] Cardiac and Pulmonary Rehab  Referring Provider  [facility]      Encounter Date: 10/29/2016  Check In:     Session Check In - 10/29/16 0849      Check-In   Location [facility]-Cardiac & Pulmonary Rehab   Staff Present Heath Lark, RN, BSN, CCRP;Chuck Caban [place], MA, ACSM RCEP, Exercise Physiologist;Amanda Oletta Darter, [place], ACSM CEP, Exercise Physiologist   Supervising physician immediately available to respond to emergencies See telemetry face sheet for immediately available ER MD   Medication changes reported     No   Fall or balance concerns reported    No   Warm-up and Cool-down Performed on first and last piece of equipment   Resistance Training Performed Yes   VAD Patient? No     VAD patient   Has back up controller? No         History  Smoking Status  . Former Smoker  . Packs/day: 1.50  . Years: 20.00  . Quit date: 09/25/1983  Smokeless Tobacco  . Never Used    Goals Met:  Independence with exercise equipment Exercise tolerated well No report of cardiac concerns or symptoms Strength training completed today  Goals Unmet:  Not Applicable  Comments: Pt able to follow exercise prescription today without complaint.  Will continue to monitor for progression.    Dr. Emily Filbert is Medical Director for [place] and [facility].

## 2016-10-30 ENCOUNTER — Encounter: Payer: Self-pay | Admitting: *Deleted

## 2016-10-30 DIAGNOSIS — Z951 Presence of aortocoronary bypass graft: Secondary | ICD-10-CM

## 2016-10-30 NOTE — Progress Notes (Signed)
Cardiac Individual Treatment Plan  Patient Details  Name: Craig [name] MRN: [id number] Date of Birth: 04/16/1945 Referring Provider:     Cardiac Rehab from 09/12/2016 in [facility] Cardiac and Pulmonary Rehab  Referring Provider  [facility]      Initial Encounter Date:    Cardiac Rehab from 09/12/2016 in [facility] Cardiac and Pulmonary Rehab  Date  09/12/16  Referring Provider  [facility]      Visit Diagnosis: S/P CABG x 3  Patient's Home Medications on Admission:  Current Outpatient Prescriptions:  .  amiodarone (PACERONE) 200 MG tablet, Take by mouth., Disp: , Rfl:  .  apixaban (ELIQUIS) 5 MG TABS tablet, Take by mouth., Disp: , Rfl:  .  aspirin EC 81 MG tablet, Take 81 mg by mouth daily., Disp: , Rfl:  .  atorvastatin (LIPITOR) 80 MG tablet, Take by mouth., Disp: , Rfl:  .  cetirizine (ZYRTEC) 10 MG tablet, Take 10 mg by mouth daily., Disp: , Rfl:  .  docusate sodium (COLACE) 100 MG capsule, Take 1 capsule (100 mg total) by mouth 2 (two) times daily., Disp: 60 capsule, Rfl: 0 .  finasteride (PROSCAR) 5 MG tablet, Take by mouth., Disp: , Rfl:  .  FLUoxetine (PROZAC) 20 MG capsule, Take by mouth., Disp: , Rfl:  .  fluticasone (FLONASE) 50 MCG/ACT nasal spray, Place 2 sprays into both nostrils daily., Disp: , Rfl: 3 .  gabapentin (NEURONTIN) 100 MG capsule, Take by mouth., Disp: , Rfl:  .  isosorbide mononitrate (IMDUR) 30 MG 24 hr tablet, , Disp: , Rfl: 4 .  lisinopril (PRINIVIL,ZESTRIL) 30 MG tablet, Take 30 mg by mouth every morning. , Disp: , Rfl: 4 .  metoprolol succinate (TOPROL-XL) 25 MG 24 hr tablet, , Disp: , Rfl: 10 .  mirabegron ER (MYRBETRIQ) 25 MG TB24 tablet, Take 1 tablet (25 mg total) by mouth daily., Disp: 30 tablet, Rfl: 11 .  Multiple Vitamin (MULTIVITAMIN) tablet, Take 1 tablet by mouth daily., Disp: , Rfl:  .  omeprazole (PRILOSEC) 40 MG capsule, Take 40 mg by mouth 2 (two) times daily., Disp: , Rfl: 10 .  simvastatin (ZOCOR) 40 MG tablet, Take 40 mg by mouth daily at 6  PM. , Disp: , Rfl: 5 .  tamsulosin (FLOMAX) 0.4 MG CAPS capsule, Take 0.4 mg by mouth daily., Disp: , Rfl: 3 .  tiZANidine (ZANAFLEX) 2 MG tablet, every 8 (eight) hours as needed. , Disp: , Rfl: 0 .  traMADol (ULTRAM) 50 MG tablet, Take 50 mg by mouth every 6 (six) hours as needed for moderate pain. , Disp: , Rfl: 0 .  vitamin E 100 UNIT capsule, Take 100 Units by mouth daily., Disp: , Rfl:   Past Medical History: Past Medical History:  Diagnosis Date  . Anginal pain ([place])   . BPH (benign prostatic hyperplasia)   . Cancer ([place])    skin cancer   . Chronic kidney disease   . GERD (gastroesophageal reflux disease)   . History of hiatal hernia   . Hypercholesterolemia   . Hypertension     Tobacco Use: History  Smoking Status  . Former Smoker  . Packs/day: 1.50  . Years: 20.00  . Quit date: 09/25/1983  Smokeless Tobacco  . Never Used    Labs: Recent Review Flowsheet Data    There is no flowsheet data to display.       Exercise Target Goals:    Exercise Program Goal: Individual exercise prescription set with THRR, safety & activity barriers.  Participant demonstrates ability to understand and report RPE using BORG scale, to self-measure pulse accurately, and to acknowledge the importance of the exercise prescription.  Exercise Prescription Goal: Starting with aerobic activity 30 plus minutes a day, 3 days per week for initial exercise prescription. Provide home exercise prescription and guidelines that participant acknowledges understanding prior to discharge.  Activity Barriers & Risk Stratification:     Activity Barriers & Cardiac Risk Stratification - 09/12/16 1354      Activity Barriers & Cardiac Risk Stratification   Activity Barriers Left Knee Replacement  waiting for knee replacement   Cardiac Risk Stratification High      6 Minute Walk:     6 Minute Walk    Row Name 09/12/16 1423         6 Minute Walk   Phase Initial     Distance 795 feet     Walk  Time 6 minutes     # of Rest Breaks 0     MPH 1.5     METS 1.8     RPE 13     VO2 Peak 6.2     Symptoms Yes (comment)     Comments knee pain 4/10 L knee     Resting HR 58 bpm     Resting BP 122/84     Max Ex. HR 94 bpm     Max Ex. BP 148/86     2 Minute Post BP 134/82        Oxygen Initial Assessment:   Oxygen Re-Evaluation:   Oxygen Discharge (Final Oxygen Re-Evaluation):   Initial Exercise Prescription:     Initial Exercise Prescription - 09/12/16 1400      Date of Initial Exercise RX and Referring Provider   Date 09/12/16   Referring Provider [facility]     Treadmill   MPH 1.5   Grade 0   Minutes 15   METs 1.8     NuStep   Level 2   SPM 80   Minutes 15   METs 1.8     Arm Ergometer   Level 1   RPM 50   Minutes 15   METs 1.5     Prescription Details   Frequency (times per week) 3   Duration Progress to 45 minutes of aerobic exercise without signs/symptoms of physical distress     Intensity   THRR 40-80% of Max Heartrate 94-131   Ratings of Perceived Exertion 11-13   Perceived Dyspnea 0-4     Resistance Training   Training Prescription Yes   Weight 3 lb   Reps 10-15      Perform Capillary Blood Glucose checks as needed.  Exercise Prescription Changes:     Exercise Prescription Changes    Row Name 09/12/16 1400 09/26/16 1500 10/01/16 0900 10/11/16 1400 10/22/16 1600     Response to Exercise   Blood Pressure (Admit) 122/84 144/82  - 134/82 124/74   Blood Pressure (Exercise) 148/86 132/64  - 138/76 144/76   Blood Pressure (Exit) 134/82 112/62  - 124/72 126/74   Heart Rate (Admit) 58 bpm 53 bpm  - 54 bpm 87 bpm   Heart Rate (Exercise) 94 bpm 68 bpm  - 80 bpm 95 bpm   Heart Rate (Exit) 55 bpm 52 bpm  - 58 bpm 59 bpm   Oxygen Saturation (Admit) 97 %  -  -  -  -   Oxygen Saturation (Exercise) 98 %  -  -  -  -  Rating of Perceived Exertion (Exercise) 13 14  - 12 13   Symptoms  - pain in hips on treadmill  - pain in hips and knee on  treadmill pain in hips and knee on treadmill   Duration  - Continue with 45 min of aerobic exercise without signs/symptoms of physical distress.  - Continue with 45 min of aerobic exercise without signs/symptoms of physical distress. Continue with 45 min of aerobic exercise without signs/symptoms of physical distress.   Intensity  - THRR unchanged  - THRR unchanged THRR unchanged     Progression   Progression  - Continue to progress workloads to maintain intensity without signs/symptoms of physical distress.  - Continue to progress workloads to maintain intensity without signs/symptoms of physical distress. Continue to progress workloads to maintain intensity without signs/symptoms of physical distress.   Average METs  - 1.66  - 1.86 1.79     Resistance Training   Training Prescription  - Yes  - Yes Yes   Weight  - 3 lb  - 3 lb 3 lb   Reps  - 10-15  - 10-15 10-15     Interval Training   Interval Training  - No  - No No     Treadmill   MPH  - 1  - 1 1   Grade  -  -  - 0 0   Minutes  - 15  - 15 15   METs  - 1.77  - 1.77 1.77     NuStep   Level  - 1  - 1 1   Minutes  - 15  - 15 15   METs  - 1.7  - 2.2 2     Arm Ergometer   Level  - 1  - 1 1   Minutes  - 15  - 15 15   METs  - 1.5  - 1.6 1.6     Home Exercise Plan   Plans to continue exercise at  -  - Home (comment)  walking and chair exercises Home (comment)  walking and chair exercises Home (comment)  walking and chair exercises   Frequency  -  - Add 2 additional days to program exercise sessions. Add 2 additional days to program exercise sessions. Add 2 additional days to program exercise sessions.   Initial Home Exercises Provided  -  - 10/01/16 10/01/16 10/01/16      Exercise Comments:     Exercise Comments    Row Name 09/17/16 0950           Exercise Comments First full day of exercise!  Patient was oriented to gym and equipment including functions, settings, policies, and procedures.  Patient's individual exercise  prescription and treatment plan were reviewed.  All starting workloads were established based on the results of the 6 minute walk test done at initial orientation visit.  The plan for exercise progression was also introduced and progression will be customized based on patient's performance and goals.          Exercise Goals and Review:     Exercise Goals    Row Name 09/12/16 1423             Exercise Goals   Increase Physical Activity Yes       Intervention Provide advice, education, support and counseling about physical activity/exercise needs.;Develop an individualized exercise prescription for aerobic and resistive training based on initial evaluation findings, risk stratification, comorbidities and participant's personal goals.  Expected Outcomes Achievement of increased cardiorespiratory fitness and enhanced flexibility, muscular endurance and strength shown through measurements of functional capacity and personal statement of participant.       Increase Strength and Stamina Yes       Intervention Provide advice, education, support and counseling about physical activity/exercise needs.;Develop an individualized exercise prescription for aerobic and resistive training based on initial evaluation findings, risk stratification, comorbidities and participant's personal goals.       Expected Outcomes Achievement of increased cardiorespiratory fitness and enhanced flexibility, muscular endurance and strength shown through measurements of functional capacity and personal statement of participant.          Exercise Goals Re-Evaluation :     Exercise Goals Re-Evaluation    Row Name 09/26/16 1556 10/01/16 0948 10/11/16 1416 10/22/16 1634       Exercise Goal Re-Evaluation   Exercise Goals Review Increase Physical Activity;Increase Strenth and Stamina Increase Physical Activity;Increase Strenth and Stamina Increase Physical Activity;Increase Strenth and Stamina Increase Physical  Activity;Increase Strength and Stamina    Comments Craig [name] is off to a good start in rehab.  He has had 3 full days of exercise.  He still struggles on the treadmill with his hips.  He wanted to stay on the treadmill versus walking on the track.  We will continue to work with him on his progression.   Reviewed home exercise with pt today.  Pt plans to do chair exercises at home for exercise.  His walking ability is limited by his knee. Reviewed THR, pulse, RPE, sign and symptoms, NTG use, and when to call 911 or MD.  Also discussed weather considerations and indoor options.  Pt voiced understanding. Craig [name] has been doing well in rehab.  He is now walking for the full 15 min without stopping, but it does continue to bother his hip and knee.  He does not walk home for this reason.  We will continue to work on his progression. Craig [name] continues to do well in rehab.  He does rest his knee on the NuStep from time to time. We will start to increase his workloads.  We will continue to monitor his progression.     Expected Outcomes Short: Continue to walk on treadmill.  Long: Improve walking ability.  Short: Add in chair exercises at home.  Long: Exercise regularly. Short: Increase workloads on seated equipment.  Long: Exercise more at home.  Short: Try to increase workloads again.  Long: Exercise more at home.        Discharge Exercise Prescription (Final Exercise Prescription Changes):     Exercise Prescription Changes - 10/22/16 1600      Response to Exercise   Blood Pressure (Admit) 124/74   Blood Pressure (Exercise) 144/76   Blood Pressure (Exit) 126/74   Heart Rate (Admit) 87 bpm   Heart Rate (Exercise) 95 bpm   Heart Rate (Exit) 59 bpm   Rating of Perceived Exertion (Exercise) 13   Symptoms pain in hips and knee on treadmill   Duration Continue with 45 min of aerobic exercise without signs/symptoms of physical distress.   Intensity THRR unchanged     Progression   Progression Continue to progress  workloads to maintain intensity without signs/symptoms of physical distress.   Average METs 1.79     Resistance Training   Training Prescription Yes   Weight 3 lb   Reps 10-15     Interval Training   Interval Training No     Treadmill   MPH 1  Grade 0   Minutes 15   METs 1.77     NuStep   Level 1   Minutes 15   METs 2     Arm Ergometer   Level 1   Minutes 15   METs 1.6     Home Exercise Plan   Plans to continue exercise at Home (comment)  walking and chair exercises   Frequency Add 2 additional days to program exercise sessions.   Initial Home Exercises Provided 10/01/16      Nutrition:  Target Goals: Understanding of nutrition guidelines, daily intake of sodium <1581m, cholesterol <2056m calories 30% from fat and 7% or less from saturated fats, daily to have 5 or more servings of fruits and vegetables.  Biometrics:     Pre Biometrics - 09/12/16 1422      Pre Biometrics   Height 5' 10.25" (1.784 m)   Weight 231 lb 9.6 oz (105.1 kg)   Waist Circumference 43.75 inches   Hip Circumference 46 inches   Waist to Hip Ratio 0.95 %   BMI (Calculated) 33.1   Single Leg Stand 13.27 seconds       Nutrition Therapy Plan and Nutrition Goals:   Nutrition Discharge: Rate Your Plate Scores:     Nutrition Assessments - 09/12/16 1334      MEDFICTS Scores   Pre Score 58      Nutrition Goals Re-Evaluation:     Nutrition Goals Re-Evaluation    [place] 10/17/16 1340             Goals   Comment Craig [name] states he eats low sodium and is cutting back on ice cream.  Does not want to meet with RD.       Expected Outcome Short - PhAbbe Amsterdamill continue healthy dietary habits.  Long - PhAbbe Amsterdamill maintain weight.          Nutrition Goals Discharge (Final Nutrition Goals Re-Evaluation):     Nutrition Goals Re-Evaluation - 10/17/16 1340      Goals   Comment Craig [name] states he eats low sodium and is cutting back on ice cream.  Does not want to meet with RD.   Expected  Outcome Short - PhAbbe Amsterdamill continue healthy dietary habits.  Long - PhAbbe Amsterdamill maintain weight.      Psychosocial: Target Goals: Acknowledge presence or absence of significant depression and/or stress, maximize coping skills, provide positive support system. Participant is able to verbalize types and ability to use techniques and skills needed for reducing stress and depression.   Initial Review & Psychosocial Screening:     Initial Psych Review & Screening - 09/12/16 1345      Initial Review   Current issues with None Identified     Family Dynamics   Good Support System? Yes  wife     Barriers   Psychosocial barriers to participate in program There are no identifiable barriers or psychosocial needs.     Screening Interventions   Interventions Encouraged to exercise;Program counselor consult      Quality of Life Scores:      Quality of Life - 09/12/16 1347      Quality of Life Scores   Health/Function Pre 20.79 %   Socioeconomic Pre 21 %   Psych/Spiritual Pre 21 %   Family Pre 21 %   GLOBAL Pre 20.91 %      PHQ-9: Recent Review Flowsheet Data    [facility]   Decreased Interest 2  Down, Depressed, Hopeless 0   PHQ - 2 Score 2   Altered sleeping 1   Tired, decreased energy 3   Change in appetite 0   Feeling bad or failure about yourself  0   Trouble concentrating 0   Moving slowly or fidgety/restless 1   Suicidal thoughts 0   PHQ-9 Score 7   Difficult doing work/chores Somewhat difficult     Interpretation of Total Score  Total Score Depression Severity:  1-4 = Minimal depression, 5-9 = Mild depression, 10-14 = Moderate depression, 15-19 = Moderately severe depression, 20-27 = Severe depression   Psychosocial Evaluation and Intervention:     Psychosocial Evaluation - 10/08/16 0944      Psychosocial Evaluation & Interventions   Interventions Encouraged to exercise with the program and follow exercise prescription   Comments  Counselor met with Mr. Christmas Craig [name]) today for initial psychosocial evaluation.  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  He has a strong support system with a spouse of 28 years and several adult children and grandchildren who live locally, as well as Craig [name] is actively involved in his local church.  He has several other health issues that impact him at this time with prostate surgery a year ago and the need for knee surgery upon completion of this program.  Craig [name] has a good appetite but reports he has intermittent sleep subsequent to the prostate surgery.  He typically gets about 6 hours/night.  Craig [name] denies a history of depression or anxiety or any current symptoms, and other than his health, he reports minimal stress in his life currently.   He has goals to improve his stamina and strength while in this program.  He will be followed by staff.     Expected Outcomes Craig [name] will benefit from consistent exercise to achieve his stated goals. The educational and psychoeducational components of this program will be helpful as well.  Craig [name] will be followed by staff.   Continue Psychosocial Services  Follow up required by staff      Psychosocial Re-Evaluation:     Psychosocial Re-Evaluation    [place] Name 10/25/16 1353             Psychosocial Re-Evaluation   Current issues with None Identified       Comments NO problmes        Expected Outcomes Short and long term goald Cont to have minimal stress and be aware to report any concerns          Psychosocial Discharge (Final Psychosocial Re-Evaluation):     Psychosocial Re-Evaluation - 10/25/16 1353      Psychosocial Re-Evaluation   Current issues with None Identified   Comments NO problmes    Expected Outcomes Short and long term goald Cont to have minimal stress and be aware to report any concerns      Vocational Rehabilitation: Provide vocational rehab assistance to qualifying candidates.   Vocational Rehab Evaluation &  Intervention:     Vocational Rehab - 09/12/16 1353      Initial Vocational Rehab Evaluation & Intervention   Assessment shows need for Vocational Rehabilitation No      Education: Education Goals: Education classes will be provided on a variety of topics geared toward better understanding of heart health and risk factor modification. Participant will state understanding/return demonstration of topics presented as noted by education test scores.  Learning Barriers/Preferences:     Learning Barriers/Preferences - 09/12/16 1352  Learning Barriers/Preferences   Learning Barriers None   Learning Preferences Verbal Instruction      Education Topics: General Nutrition Guidelines/Fats and Fiber: -Group instruction provided by verbal, written material, models and posters to present the general guidelines for heart healthy nutrition. Gives an explanation and review of dietary fats and fiber.   Cardiac Rehab from 10/29/2016 in [facility] Cardiac and Pulmonary Rehab  Date  10/08/16  Educator  CR  Instruction Review Code  1- Verbalizes Understanding      Controlling Sodium/Reading Food Labels: -Group verbal and written material supporting the discussion of sodium use in heart healthy nutrition. Review and explanation with models, verbal and written materials for utilization of the food label.   Cardiac Rehab from 10/29/2016 in [facility] Cardiac and Pulmonary Rehab  Date  10/15/16  Educator  PI  Instruction Review Code  2- Demonstrated Understanding      Exercise Physiology & Risk Factors: - Group verbal and written instruction with models to review the exercise physiology of the cardiovascular system and associated critical values. Details cardiovascular disease risk factors and the goals associated with each risk factor.   Cardiac Rehab from 10/29/2016 in [facility] Cardiac and Pulmonary Rehab  Date  10/24/16  Educator  [facility]  Instruction Review Code  1- Verbalizes Understanding      Aerobic  Exercise & Resistance Training: - Gives group verbal and written discussion on the health impact of inactivity. On the components of aerobic and resistive training programs and the benefits of this training and how to safely progress through these programs.   Cardiac Rehab from 10/29/2016 in [facility] Cardiac and Pulmonary Rehab  Date  10/29/16  Educator  [facility]  Instruction Review Code  1- Verbalizes Understanding      Flexibility, Balance, General Exercise Guidelines: - Provides group verbal and written instruction on the benefits of flexibility and balance training programs. Provides general exercise guidelines with specific guidelines to those with heart or lung disease. Demonstration and skill practice provided.   Stress Management: - Provides group verbal and written instruction about the health risks of elevated stress, cause of high stress, and healthy ways to reduce stress.   Depression: - Provides group verbal and written instruction on the correlation between heart/lung disease and depressed mood, treatment options, and the stigmas associated with seeking treatment.   Cardiac Rehab from 10/29/2016 in [facility] Cardiac and Pulmonary Rehab  Date  10/03/16  Educator  [facility]  Instruction Review Code (retired)  1- partially meets, needs review/practice      Anatomy & Physiology of the Heart: - Group verbal and written instruction and models provide basic cardiac anatomy and physiology, with the coronary electrical and arterial systems. Review of: AMI, Angina, Valve disease, Heart Failure, Cardiac Arrhythmia, Pacemakers, and the ICD.   Cardiac Procedures: - Group verbal and written instruction to review commonly prescribed medications for heart disease. Reviews the medication, class of the drug, and side effects. Includes the steps to properly store meds and maintain the prescription regimen. (beta blockers and nitrates)   Cardiac Rehab from 10/29/2016 in [facility] Cardiac and Pulmonary Rehab  Date   09/17/16  Educator  CE      Cardiac Medications I: - Group verbal and written instruction to review commonly prescribed medications for heart disease. Reviews the medication, class of the drug, and side effects. Includes the steps to properly store meds and maintain the prescription regimen.   Cardiac Rehab from 10/29/2016 in [facility] Cardiac and Pulmonary Rehab  Date  09/26/16 [8/9  Part Two]  Educator  [facility]      Cardiac Medications II: -Group verbal and written instruction to review commonly prescribed medications for heart disease. Reviews the medication, class of the drug, and side effects. (all other drug classes)    Go Sex-Intimacy & Heart Disease, Get SMART - Goal Setting: - Group verbal and written instruction through game format to discuss heart disease and the return to sexual intimacy. Provides group verbal and written material to discuss and apply goal setting through the application of the S.M.A.R.T. Method.   Cardiac Rehab from 10/29/2016 in [facility] Cardiac and Pulmonary Rehab  Date  09/17/16  Educator  CE      Other Matters of the Heart: - Provides group verbal, written materials and models to describe Heart Failure, Angina, Valve Disease, Peripheral Artery Disease, and Diabetes in the realm of heart disease. Includes description of the disease process and treatment options available to the cardiac patient.   Exercise & Equipment Safety: - Individual verbal instruction and demonstration of equipment use and safety with use of the equipment.   Cardiac Rehab from 10/29/2016 in [facility] Cardiac and Pulmonary Rehab  Date  09/12/16  Educator  [facility]      Infection Prevention: - Provides verbal and written material to individual with discussion of infection control including proper hand washing and proper equipment cleaning during exercise session.   Cardiac Rehab from 10/29/2016 in [facility] Cardiac and Pulmonary Rehab  Date  09/12/16  Educator  [facility]      Falls Prevention: - Provides  verbal and written material to individual with discussion of falls prevention and safety.   Cardiac Rehab from 10/29/2016 in [facility] Cardiac and Pulmonary Rehab  Date  09/12/16  Educator  [facility]  Instruction Review Code (retired)  2- meets goals/outcomes      Diabetes: - Individual verbal and written instruction to review signs/symptoms of diabetes, desired ranges of glucose level fasting, after meals and with exercise. Acknowledge that pre and post exercise glucose checks will be done for 3 sessions at entry of program.   Other: -Provides group and verbal instruction on various topics (see comments)    Knowledge Questionnaire Score:     Knowledge Questionnaire Score - 09/12/16 1352      Knowledge Questionnaire Score   Pre Score 20/28  correct answers reviewed with Craig [name]      Core Components/Risk Factors/Patient Goals at Admission:     Personal Goals and Risk Factors at Admission - 09/12/16 1333      Core Components/Risk Factors/Patient Goals on Admission   Hypertension Yes   Intervention Provide education on lifestyle modifcations including regular physical activity/exercise, weight management, moderate sodium restriction and increased consumption of fresh fruit, vegetables, and low fat dairy, alcohol moderation, and smoking cessation.;Monitor prescription use compliance.   Expected Outcomes Short Term: Continued assessment and intervention until BP is < 140/74m HG in hypertensive participants. < 130/875mHG in hypertensive participants with diabetes, heart failure or chronic kidney disease.;Long Term: Maintenance of blood pressure at goal levels.   Lipids Yes   Intervention Provide education and support for participant on nutrition & aerobic/resistive exercise along with prescribed medications to achieve LDL <7074mHDL >26m72m Expected Outcomes Short Term: Participant states understanding of desired cholesterol values and is compliant with medications prescribed. Participant is  following exercise prescription and nutrition guidelines.;Long Term: Cholesterol controlled with medications as prescribed, with individualized exercise RX and with personalized nutrition plan. Value goals: LDL < 70mg17mL > 40 mg.  Stress Yes   Intervention Offer individual and/or small group education and counseling on adjustment to heart disease, stress management and health-related lifestyle change. Teach and support self-help strategies.;Refer participants experiencing significant psychosocial distress to appropriate mental health specialists for further evaluation and treatment. When possible, include family members and significant others in education/counseling sessions.   Expected Outcomes Short Term: Participant demonstrates changes in health-related behavior, relaxation and other stress management skills, ability to obtain effective social support, and compliance with psychotropic medications if prescribed.;Long Term: Emotional wellbeing is indicated by absence of clinically significant psychosocial distress or social isolation.      Core Components/Risk Factors/Patient Goals Review:      Goals and Risk Factor Review    Row Name 10/17/16 1337             Core Components/Risk Factors/Patient Goals Review   Personal Goals Review Other;Hypertension       Review Phils BP has been good during HT measurements.  He sees his PCP tomorrow and will have results of other blood work.  He sees Dr Marry Guan today about knee replacement, although it will be when he finishes HT.  He has been walking some at home and doing strength and stretching.       Expected Outcomes Short - Craig [name] will continue to attend HT. Long - Craig [name] will graduate and maintain his exercise after knee surgery.          Core Components/Risk Factors/Patient Goals at Discharge (Final Review):      Goals and Risk Factor Review - 10/17/16 1337      Core Components/Risk Factors/Patient Goals Review   Personal Goals Review  Other;Hypertension   Review Phils BP has been good during HT measurements.  He sees his PCP tomorrow and will have results of other blood work.  He sees Dr Marry Guan today about knee replacement, although it will be when he finishes HT.  He has been walking some at home and doing strength and stretching.   Expected Outcomes Short - Craig [name] will continue to attend HT. Long - Craig [name] will graduate and maintain his exercise after knee surgery.      ITP Comments:     ITP Comments    Row Name 09/12/16 1325 10/02/16 0620 10/30/16 1118       ITP Comments Med Review completed. Initial ITP created. Diagnosis can be found in [place] 5/16 30 day review. Continue with ITP unless directed changes per Medical Director review  30 day review. Continue with ITP unless directed changes per Medical Director review.          Comments:

## 2016-11-05 DIAGNOSIS — Z48812 Encounter for surgical aftercare following surgery on the circulatory system: Secondary | ICD-10-CM | POA: Diagnosis not present

## 2016-11-05 DIAGNOSIS — Z951 Presence of aortocoronary bypass graft: Secondary | ICD-10-CM

## 2016-11-05 NOTE — Progress Notes (Signed)
Daily Session Note  Patient Details  Name: Craig [name] MRN: [id number] Date of Birth: [birth year] Referring Provider:     Cardiac Rehab from 09/12/2016 in [facility] Cardiac and Pulmonary Rehab  Referring Provider  [facility]      Encounter Date: 11/05/2016  Check In:     Session Check In - 11/05/16 0913      Check-In   Location [facility]-Cardiac & Pulmonary Rehab   Staff Present Heath Lark, RN, BSN, CCRP;Jessica Poteet, MA, ACSM RCEP, Exercise Physiologist;Kayron Hicklin Oletta Darter, [place], ACSM CEP, Exercise Physiologist   Supervising physician immediately available to respond to emergencies See telemetry face sheet for immediately available ER MD   Medication changes reported     No   Fall or balance concerns reported    No   Warm-up and Cool-down Performed on first and last piece of equipment   Resistance Training Performed Yes   VAD Patient? No     VAD patient   Has back up controller? No     Pain Assessment   Currently in Pain? No/denies         History  Smoking Status  . Former Smoker  . Packs/day: 1.50  . Years: 20.00  . Quit date: 09/25/1983  Smokeless Tobacco  . Never Used    Goals Met:  Independence with exercise equipment Exercise tolerated well No report of cardiac concerns or symptoms Strength training completed today  Goals Unmet:  Not Applicable  Comments: Pt able to follow exercise prescription today without complaint.  Will continue to monitor for progression.    Dr. Emily Filbert is Medical Director for [place] and [facility].

## 2016-11-07 DIAGNOSIS — Z48812 Encounter for surgical aftercare following surgery on the circulatory system: Secondary | ICD-10-CM | POA: Diagnosis not present

## 2016-11-07 DIAGNOSIS — Z951 Presence of aortocoronary bypass graft: Secondary | ICD-10-CM

## 2016-11-07 NOTE — Progress Notes (Signed)
Daily Session Note  Patient Details  Name: MARIOS GAISER MRN: [id number] Date of Birth: 27-Mar-1945 Referring Provider:     Cardiac Rehab from 09/12/2016 in [facility] Cardiac and Pulmonary Rehab  Referring Provider  [facility]      Encounter Date: 11/07/2016  Check In:     Session Check In - 11/07/16 0931      Check-In   Location [facility]-Cardiac & Pulmonary Rehab   Staff Present Alberteen Sam, MA, ACSM RCEP, Exercise Physiologist;Aayden Cefalu Oletta Darter, BA, ACSM CEP, Exercise Physiologist;Meredith Sherryll Burger, RN BSN   Supervising physician immediately available to respond to emergencies See telemetry face sheet for immediately available ER MD         History  Smoking Status  . Former Smoker  . Packs/day: 1.50  . Years: 20.00  . Quit date: 09/25/1983  Smokeless Tobacco  . Never Used    Goals Met:  Proper associated with RPD/PD & O2 Sat Exercise tolerated well No report of cardiac concerns or symptoms Strength training completed today  Goals Unmet:  Not Applicable  Comments: Pt able to follow exercise prescription today without complaint.  Will continue to monitor for progression.    Dr. Emily Filbert is Medical Director for [place] and [facility].

## 2016-11-12 DIAGNOSIS — Z951 Presence of aortocoronary bypass graft: Secondary | ICD-10-CM

## 2016-11-12 NOTE — Progress Notes (Signed)
Daily Session Note  Patient Details  Name: ANDONI BUSCH MRN: [id number] Date of Birth: 1945/05/15 Referring Provider:     Cardiac Rehab from 09/12/2016 in [facility] Cardiac and Pulmonary Rehab  Referring Provider  [facility]      Encounter Date: 11/12/2016  Check In:     Session Check In - 11/12/16 0839      Check-In   Location [facility]-Cardiac & Pulmonary Rehab   Staff Present Heath Lark, RN, BSN, CCRP;Jessica [place], MA, ACSM RCEP, Exercise Physiologist;Kruze Atchley Oletta Darter, [place], ACSM CEP, Exercise Physiologist   Supervising physician immediately available to respond to emergencies See telemetry face sheet for immediately available ER MD   Medication changes reported     No   Fall or balance concerns reported    No   Warm-up and Cool-down Performed on first and last piece of equipment   Resistance Training Performed Yes   VAD Patient? No     VAD patient   Has back up controller? No         History  Smoking Status  . Former Smoker  . Packs/day: 1.50  . Years: 20.00  . Quit date: 09/25/1983  Smokeless Tobacco  . Never Used    Goals Met:  Independence with exercise equipment Exercise tolerated well No report of cardiac concerns or symptoms Strength training completed today  Goals Unmet:  Not Applicable  Comments: Pt able to follow exercise prescription today without complaint.  Will continue to monitor for progression.    Dr. Emily Filbert is Medical Director for [place] and [facility].

## 2016-11-13 ENCOUNTER — Ambulatory Visit: Payer: Medicare Other | Admitting: Urology

## 2016-11-13 ENCOUNTER — Encounter: Payer: Self-pay | Admitting: Urology

## 2016-11-14 ENCOUNTER — Encounter: Payer: Medicare Other | Admitting: *Deleted

## 2016-11-14 DIAGNOSIS — Z48812 Encounter for surgical aftercare following surgery on the circulatory system: Secondary | ICD-10-CM | POA: Diagnosis not present

## 2016-11-14 DIAGNOSIS — Z951 Presence of aortocoronary bypass graft: Secondary | ICD-10-CM

## 2016-11-14 NOTE — Progress Notes (Signed)
Daily Session Note  Patient Details  Name: CHANTRY HEADEN MRN: [id number] Date of Birth: 07-27-1945 Referring Provider:     Cardiac Rehab from 09/12/2016 in [facility] Cardiac [facility]monary Rehab  Referring Provider  [facility]      Encounter Date: 11/14/2016  Check In:     Session Check In - 11/14/16 0839      Check-In   Location [facility]-Cardiac & Pulmonary Rehab   Staff Present Alberteen Sam, MA, ACSM RCEP, Exercise Physiologist;Amanda Oletta Darter, BA, ACSM CEP, Exercise Physiologist;Meredith Sherryll Burger, RN BSN   Supervising physician immediately available to respond to emergencies See telemetry face sheet for immediately available ER MD   Medication changes reported     No   Fall or balance concerns reported    No   Warm-up and Cool-down Performed on first and last piece of equipment   Resistance Training Performed Yes   VAD Patient? No     Pain Assessment   Currently in Pain? No/denies   Multiple Pain Sites No         History  Smoking Status  . Former Smoker  . Packs/day: 1.50  . Years: 20.00  . Quit date: 09/25/1983  Smokeless Tobacco  . Never Used    Goals Met:  Independence with exercise equipment Exercise tolerated well Personal goals reviewed No report of cardiac concerns or symptoms Strength training completed today  Goals Unmet:  Not Applicable  Comments: Pt able to follow exercise prescription today without complaint.  Will continue to monitor for progression. See ITP for goal review.   Dr. Emily Filbert is Medical Director for [place] and [facility].

## 2016-11-19 ENCOUNTER — Encounter: Payer: Medicare Other | Attending: Internal Medicine

## 2016-11-19 DIAGNOSIS — Z951 Presence of aortocoronary bypass graft: Secondary | ICD-10-CM | POA: Insufficient documentation

## 2016-11-19 DIAGNOSIS — Z48812 Encounter for surgical aftercare following surgery on the circulatory system: Secondary | ICD-10-CM | POA: Insufficient documentation

## 2016-11-19 NOTE — Progress Notes (Signed)
Daily Session Note  Patient Details  Name: Craig [name] MRN: [id number] Date of Birth: 05/30/1945 Referring Provider:     Cardiac Rehab from 09/12/2016 in [facility] Cardiac and Pulmonary Rehab  Referring Provider  [facility]      Encounter Date: 11/19/2016  Check In:     Session Check In - 11/19/16 0823      Check-In   Location [facility]-Cardiac & Pulmonary Rehab   Staff Present Nyoka Cowden, RN, BSN, Willette Pa, MA, ACSM RCEP, Exercise Physiologist;Micah Galeno Oletta Darter, [place], ACSM CEP, Exercise Physiologist   Supervising physician immediately available to respond to emergencies See telemetry face sheet for immediately available ER MD   Medication changes reported     No   Fall or balance concerns reported    No   Warm-up and Cool-down Performed on first and last piece of equipment   Resistance Training Performed Yes   VAD Patient? No     VAD patient   Has back up controller? No     Pain Assessment   Currently in Pain? No/denies         History  Smoking Status  . Former Smoker  . Packs/day: 1.50  . Years: 20.00  . Quit date: 09/25/1983  Smokeless Tobacco  . Never Used    Goals Met:  Independence with exercise equipment Exercise tolerated well No report of cardiac concerns or symptoms Strength training completed today  Goals Unmet:  Not Applicable  Comments: Pt able to follow exercise prescription today without complaint.  Will continue to monitor for progression.    Dr. Emily Filbert is Medical Director for [place] and [facility].

## 2016-11-21 DIAGNOSIS — Z951 Presence of aortocoronary bypass graft: Secondary | ICD-10-CM

## 2016-11-21 DIAGNOSIS — Z48812 Encounter for surgical aftercare following surgery on the circulatory system: Secondary | ICD-10-CM | POA: Diagnosis not present

## 2016-11-21 NOTE — Progress Notes (Signed)
Daily Session Note  Patient Details  Name: VESTAL MARKIN MRN: [id number] Date of Birth: 02-21-1945 Referring Provider:     Cardiac Rehab from 09/12/2016 in [facility] Cardiac and Pulmonary Rehab  Referring Provider  [facility]      Encounter Date: 11/21/2016  Check In:     Session Check In - 11/21/16 0826      Check-In   Location [facility]-Cardiac & Pulmonary Rehab   Staff Present Nada Maclachlan, BA, ACSM CEP, Exercise Physiologist;Jessica Luan [place], MA, ACSM RCEP, Exercise Physiologist;Meredith Sherryll Burger, RN BSN   Supervising physician immediately available to respond to emergencies See telemetry face sheet for immediately available ER MD   Medication changes reported     No   Fall or balance concerns reported    No   Warm-up and Cool-down Performed on first and last piece of equipment   Resistance Training Performed Yes   VAD Patient? No     VAD patient   Has back up controller? No     Pain Assessment   Currently in Pain? No/denies         History  Smoking Status  . Former Smoker  . Packs/day: 1.50  . Years: 20.00  . Quit date: 09/25/1983  Smokeless Tobacco  . Never Used    Goals Met:  Independence with exercise equipment Exercise tolerated well Strength training completed today  Goals Unmet:  Not Applicable  Comments: Pt able to follow exercise prescription today without complaint.  Will continue to monitor for progression.   Dr. Emily Filbert is Medical Director for [place] and [facility].

## 2016-11-27 ENCOUNTER — Encounter: Payer: Self-pay | Admitting: *Deleted

## 2016-11-27 NOTE — Progress Notes (Signed)
Cardiac Individual Treatment Plan  Patient Details  Name: Craig [name] MRN: [id number] Date of Birth: August 31, 1945 Referring Provider:     Cardiac Rehab from 09/12/2016 in [facility] Cardiac and Pulmonary Rehab  Referring Provider  [facility]      Initial Encounter Date:    Cardiac Rehab from 09/12/2016 in [facility] Cardiac and Pulmonary Rehab  Date  09/12/16  Referring Provider  [facility]      Visit Diagnosis: No diagnosis found.  Patient's Home Medications on Admission:  Current Outpatient Prescriptions:  .  amiodarone (PACERONE) 200 MG tablet, Take by mouth., Disp: , Rfl:  .  apixaban (ELIQUIS) 5 MG TABS tablet, Take by mouth., Disp: , Rfl:  .  aspirin EC 81 MG tablet, Take 81 mg by mouth daily., Disp: , Rfl:  .  atorvastatin (LIPITOR) 80 MG tablet, Take by mouth., Disp: , Rfl:  .  cetirizine (ZYRTEC) 10 MG tablet, Take 10 mg by mouth daily., Disp: , Rfl:  .  docusate sodium (COLACE) 100 MG capsule, Take 1 capsule (100 mg total) by mouth 2 (two) times daily., Disp: 60 capsule, Rfl: 0 .  finasteride (PROSCAR) 5 MG tablet, Take by mouth., Disp: , Rfl:  .  FLUoxetine (PROZAC) 20 MG capsule, Take by mouth., Disp: , Rfl:  .  fluticasone (FLONASE) 50 MCG/ACT nasal spray, Place 2 sprays into both nostrils daily., Disp: , Rfl: 3 .  gabapentin (NEURONTIN) 100 MG capsule, Take by mouth., Disp: , Rfl:  .  isosorbide mononitrate (IMDUR) 30 MG 24 hr tablet, , Disp: , Rfl: 4 .  lisinopril (PRINIVIL,ZESTRIL) 30 MG tablet, Take 30 mg by mouth every morning. , Disp: , Rfl: 4 .  metoprolol succinate (TOPROL-XL) 25 MG 24 hr tablet, , Disp: , Rfl: 10 .  mirabegron ER (MYRBETRIQ) 25 MG TB24 tablet, Take 1 tablet (25 mg total) by mouth daily., Disp: 30 tablet, Rfl: 11 .  Multiple Vitamin (MULTIVITAMIN) tablet, Take 1 tablet by mouth daily., Disp: , Rfl:  .  omeprazole (PRILOSEC) 40 MG capsule, Take 40 mg by mouth 2 (two) times daily., Disp: , Rfl: 10 .  simvastatin (ZOCOR) 40 MG tablet, Take 40 mg by mouth  daily at 6 PM. , Disp: , Rfl: 5 .  tamsulosin (FLOMAX) 0.4 MG CAPS capsule, Take 0.4 mg by mouth daily., Disp: , Rfl: 3 .  tiZANidine (ZANAFLEX) 2 MG tablet, every 8 (eight) hours as needed. , Disp: , Rfl: 0 .  traMADol (ULTRAM) 50 MG tablet, Take 50 mg by mouth every 6 (six) hours as needed for moderate pain. , Disp: , Rfl: 0 .  vitamin E 100 UNIT capsule, Take 100 Units by mouth daily., Disp: , Rfl:   Past Medical History: Past Medical History:  Diagnosis Date  . Anginal pain ([place])   . BPH (benign prostatic hyperplasia)   . Cancer ([place])    skin cancer   . Chronic kidney disease   . GERD (gastroesophageal reflux disease)   . History of hiatal hernia   . Hypercholesterolemia   . Hypertension     Tobacco Use: History  Smoking Status  . Former Smoker  . Packs/day: 1.50  . Years: 20.00  . Quit date: 09/25/1983  Smokeless Tobacco  . Never Used    Labs: Recent Review Flowsheet Data    There is no flowsheet data to display.       Exercise Target Goals:    Exercise Program Goal: Individual exercise prescription set with THRR, safety & activity barriers. Participant  demonstrates ability to understand and report RPE using BORG scale, to self-measure pulse accurately, and to acknowledge the importance of the exercise prescription.  Exercise Prescription Goal: Starting with aerobic activity 30 plus minutes a day, 3 days per week for initial exercise prescription. Provide home exercise prescription and guidelines that participant acknowledges understanding prior to discharge.  Activity Barriers & Risk Stratification:     Activity Barriers & Cardiac Risk Stratification - 09/12/16 1354      Activity Barriers & Cardiac Risk Stratification   Activity Barriers Left Knee Replacement  waiting for knee replacement   Cardiac Risk Stratification High      6 Minute Walk:     6 Minute Walk    Row Name 09/12/16 1423         6 Minute Walk   Phase Initial     Distance 795 feet      Walk Time 6 minutes     # of Rest Breaks 0     MPH 1.5     METS 1.8     RPE 13     VO2 Peak 6.2     Symptoms Yes (comment)     Comments knee pain 4/10 L knee     Resting HR 58 bpm     Resting BP 122/84     Max Ex. HR 94 bpm     Max Ex. BP 148/86     2 Minute Post BP 134/82        Oxygen Initial Assessment:   Oxygen Re-Evaluation:   Oxygen Discharge (Final Oxygen Re-Evaluation):   Initial Exercise Prescription:     Initial Exercise Prescription - 09/12/16 1400      Date of Initial Exercise RX and Referring Provider   Date 09/12/16   Referring Provider [facility]     Treadmill   MPH 1.5   Grade 0   Minutes 15   METs 1.8     NuStep   Level 2   SPM 80   Minutes 15   METs 1.8     Arm Ergometer   Level 1   RPM 50   Minutes 15   METs 1.5     Prescription Details   Frequency (times per week) 3   Duration Progress to 45 minutes of aerobic exercise without signs/symptoms of physical distress     Intensity   THRR 40-80% of Max Heartrate 94-131   Ratings of Perceived Exertion 11-13   Perceived Dyspnea 0-4     Resistance Training   Training Prescription Yes   Weight 3 lb   Reps 10-15      Perform Capillary Blood Glucose checks as needed.  Exercise Prescription Changes:     Exercise Prescription Changes    Row Name 09/12/16 1400 09/26/16 1500 10/01/16 0900 10/11/16 1400 10/22/16 1600     Response to Exercise   Blood Pressure (Admit) 122/84 144/82  - 134/82 124/74   Blood Pressure (Exercise) 148/86 132/64  - 138/76 144/76   Blood Pressure (Exit) 134/82 112/62  - 124/72 126/74   Heart Rate (Admit) 58 bpm 53 bpm  - 54 bpm 87 bpm   Heart Rate (Exercise) 94 bpm 68 bpm  - 80 bpm 95 bpm   Heart Rate (Exit) 55 bpm 52 bpm  - 58 bpm 59 bpm   Oxygen Saturation (Admit) 97 %  -  -  -  -   Oxygen Saturation (Exercise) 98 %  -  -  -  -  Rating of Perceived Exertion (Exercise) 13 14  - 12 13   Symptoms  - pain in hips on treadmill  - pain in hips and knee  on treadmill pain in hips and knee on treadmill   Duration  - Continue with 45 min of aerobic exercise without signs/symptoms of physical distress.  - Continue with 45 min of aerobic exercise without signs/symptoms of physical distress. Continue with 45 min of aerobic exercise without signs/symptoms of physical distress.   Intensity  - THRR unchanged  - THRR unchanged THRR unchanged     Progression   Progression  - Continue to progress workloads to maintain intensity without signs/symptoms of physical distress.  - Continue to progress workloads to maintain intensity without signs/symptoms of physical distress. Continue to progress workloads to maintain intensity without signs/symptoms of physical distress.   Average METs  - 1.66  - 1.86 1.79     Resistance Training   Training Prescription  - Yes  - Yes Yes   Weight  - 3 lb  - 3 lb 3 lb   Reps  - 10-15  - 10-15 10-15     Interval Training   Interval Training  - No  - No No     Treadmill   MPH  - 1  - 1 1   Grade  -  -  - 0 0   Minutes  - 15  - 15 15   METs  - 1.77  - 1.77 1.77     NuStep   Level  - 1  - 1 1   Minutes  - 15  - 15 15   METs  - 1.7  - 2.2 2     Arm Ergometer   Level  - 1  - 1 1   Minutes  - 15  - 15 15   METs  - 1.5  - 1.6 1.6     Home Exercise Plan   Plans to continue exercise at  -  - Home (comment)  walking and chair exercises Home (comment)  walking and chair exercises Home (comment)  walking and chair exercises   Frequency  -  - Add 2 additional days to program exercise sessions. Add 2 additional days to program exercise sessions. Add 2 additional days to program exercise sessions.   Initial Home Exercises Provided  -  - 10/01/16 10/01/16 10/01/16   Row Name 11/05/16 1400 11/22/16 1000           Response to Exercise   Blood Pressure (Admit) 122/84 128/76      Blood Pressure (Exercise) 140/82 142/70      Blood Pressure (Exit) 146/82 134/70      Heart Rate (Admit) 63 bpm 67 bpm      Heart Rate  (Exercise) 115 bpm 91 bpm      Heart Rate (Exit) 56 bpm 58 bpm      Rating of Perceived Exertion (Exercise) 13 13      Symptoms pain in hips and knee on treadmill pain in hips and knee on treadmill      Duration Continue with 45 min of aerobic exercise without signs/symptoms of physical distress. Continue with 45 min of aerobic exercise without signs/symptoms of physical distress.      Intensity THRR unchanged THRR unchanged        Progression   Progression Continue to progress workloads to maintain intensity without signs/symptoms of physical distress. Continue to progress workloads to maintain intensity without signs/symptoms of  physical distress.      Average METs 2.02 2.07        Resistance Training   Training Prescription Yes Yes      Weight 3 lb 3 lb      Reps 10-15 10-15        Interval Training   Interval Training No No        Treadmill   MPH 1 1.3      Grade 0 0      Minutes 15 15      METs 1.77 2        NuStep   Level 1 1      Minutes 15 15      METs 2.6 2.5        Arm Ergometer   Level 1 1      Minutes 15 15      METs 1.7 1.7        Home Exercise Plan   Plans to continue exercise at Home (comment)  walking and chair exercises Home (comment)  walking and chair exercises      Frequency Add 2 additional days to program exercise sessions. Add 2 additional days to program exercise sessions.      Initial Home Exercises Provided 10/01/16 10/01/16         Exercise Comments:     Exercise Comments    Row Name 09/17/16 0950           Exercise Comments First full day of exercise!  Patient was oriented to gym and equipment including functions, settings, policies, and procedures.  Patient's individual exercise prescription and treatment plan were reviewed.  All starting workloads were established based on the results of the 6 minute walk test done at initial orientation visit.  The plan for exercise progression was also introduced and progression will be customized  based on patient's performance and goals.          Exercise Goals and Review:     Exercise Goals    Row Name 09/12/16 1423             Exercise Goals   Increase Physical Activity Yes       Intervention Provide advice, education, support and counseling about physical activity/exercise needs.;Develop an individualized exercise prescription for aerobic and resistive training based on initial evaluation findings, risk stratification, comorbidities and participant's personal goals.       Expected Outcomes Achievement of increased cardiorespiratory fitness and enhanced flexibility, muscular endurance and strength shown through measurements of functional capacity and personal statement of participant.       Increase Strength and Stamina Yes       Intervention Provide advice, education, support and counseling about physical activity/exercise needs.;Develop an individualized exercise prescription for aerobic and resistive training based on initial evaluation findings, risk stratification, comorbidities and participant's personal goals.       Expected Outcomes Achievement of increased cardiorespiratory fitness and enhanced flexibility, muscular endurance and strength shown through measurements of functional capacity and personal statement of participant.          Exercise Goals Re-Evaluation :     Exercise Goals Re-Evaluation    Row Name 09/26/16 1556 10/01/16 0948 10/11/16 1416 10/22/16 1634 11/05/16 1410     Exercise Goal Re-Evaluation   Exercise Goals Review Increase Physical Activity;Increase Strenth and Stamina Increase Physical Activity;Increase Strenth and Stamina Increase Physical Activity;Increase Strenth and Stamina Increase Physical Activity;Increase Strength and Stamina Increase Physical Activity;Increase Strength and Stamina   Comments  Craig [name] is off to a good start in rehab.  He has had 3 full days of exercise.  He still struggles on the treadmill with his hips.  He wanted to stay on  the treadmill versus walking on the track.  We will continue to work with him on his progression.   Reviewed home exercise with pt today.  Pt plans to do chair exercises at home for exercise.  His walking ability is limited by his knee. Reviewed THR, pulse, RPE, sign and symptoms, NTG use, and when to call 911 or MD.  Also discussed weather considerations and indoor options.  Pt voiced understanding. Craig [name] has been doing well in rehab.  He is now walking for the full 15 min without stopping, but it does continue to bother his hip and knee.  He does not walk home for this reason.  We will continue to work on his progression. Craig [name] continues to do well in rehab.  He does rest his knee on the NuStep from time to time. We will start to increase his workloads.  We will continue to monitor his progression.  Craig [name] has been doing well in rehab.  He is up to 2.6 METs on the NuStep.  We will continue to monitor his progression.   Expected Outcomes Short: Continue to walk on treadmill.  Long: Improve walking ability.  Short: Add in chair exercises at home.  Long: Exercise regularly. Short: Increase workloads on seated equipment.  Long: Exercise more at home.  Short: Try to increase workloads again.  Long: Exercise more at home.  Short: Increase arm crank workload.  Long: Continue to build up strength and stamina.   [place] Name 11/14/16 0830 11/22/16 1030           Exercise Goal Re-Evaluation   Exercise Goals Review Increase Physical Activity;Increase Strength and Stamina;Understanding of Exercise Prescription Increase Physical Activity;Increase Strength and Stamina      Comments Craig [name] is doing pretty good.  He is doing his chair exercsies at home three days a week.  He is also doing the weight exercises as well.  He is getting his strength and stamina are getting better.  He is scheduled to have his knee replaced on 11/28!  He will continue to build strength until then.  Craig [name] has continued to do well in rehab.  He is excited  about his knee replacement and hopes it will help him.  He is up to 1.3 mph on the treadmill.  We will continue to monitor his progression.       Expected Outcomes Short: Double weight sets at home since he is using light weight.  Long: Continue to build strength and stamina to prepare for surgery. Short: Continue to work on chair and weight exercises at home.  Long: Continue to build strength for surgery.          Discharge Exercise Prescription (Final Exercise Prescription Changes):     Exercise Prescription Changes - 11/22/16 1000      Response to Exercise   Blood Pressure (Admit) 128/76   Blood Pressure (Exercise) 142/70   Blood Pressure (Exit) 134/70   Heart Rate (Admit) 67 bpm   Heart Rate (Exercise) 91 bpm   Heart Rate (Exit) 58 bpm   Rating of Perceived Exertion (Exercise) 13   Symptoms pain in hips and knee on treadmill   Duration Continue with 45 min of aerobic exercise without signs/symptoms of physical distress.   Intensity THRR unchanged     Progression  Progression Continue to progress workloads to maintain intensity without signs/symptoms of physical distress.   Average METs 2.07     Resistance Training   Training Prescription Yes   Weight 3 lb   Reps 10-15     Interval Training   Interval Training No     Treadmill   MPH 1.3   Grade 0   Minutes 15   METs 2     NuStep   Level 1   Minutes 15   METs 2.5     Arm Ergometer   Level 1   Minutes 15   METs 1.7     Home Exercise Plan   Plans to continue exercise at Home (comment)  walking and chair exercises   Frequency Add 2 additional days to program exercise sessions.   Initial Home Exercises Provided 10/01/16      Nutrition:  Target Goals: Understanding of nutrition guidelines, daily intake of sodium <158m, cholesterol <2023m calories 30% from fat and 7% or less from saturated fats, daily to have 5 or more servings of fruits and vegetables.  Biometrics:     Pre Biometrics - 09/12/16 1422       Pre Biometrics   Height 5' 10.25" (1.784 m)   Weight 231 lb 9.6 oz (105.1 kg)   Waist Circumference 43.75 inches   Hip Circumference 46 inches   Waist to Hip Ratio 0.95 %   BMI (Calculated) 33.1   Single Leg Stand 13.27 seconds       Nutrition Therapy Plan and Nutrition Goals:     Nutrition Therapy & Goals - 11/14/16 0840      Nutrition Therapy   RD appointment defered Yes      Nutrition Discharge: Rate Your Plate Scores:     Nutrition Assessments - 11/19/16 1144      MEDFICTS Scores   Pre Score 58   Post Score 77   Score Difference 19      Nutrition Goals Re-Evaluation:     Nutrition Goals Re-Evaluation    Row Name 10/17/16 1340 11/14/16 0840           Goals   Current Weight  - 234 lb (106.1 kg)      Nutrition Goal  - Cut back salt, heart healthy diet, portion control      Comment Craig [name] states he eats low sodium and is cutting back on ice cream.  Does not want to meet with RD. PhAbbe Amsterdamas reduced his sodium bu 90%!  He limits his sweets.  He needs to work on portion control to help with his weight loss.  He continues to decline a nutrtion appointment.  He has cut back, but knows he still needs to watch portions.       Expected Outcome Short - PhAbbe Amsterdamill continue healthy dietary habits.  Long - PhAbbe Amsterdamill maintain weight. Short: Start to focus on portion control.  Long: Continue with heart healthy diet.          Nutrition Goals Discharge (Final Nutrition Goals Re-Evaluation):     Nutrition Goals Re-Evaluation - 11/14/16 0840      Goals   Current Weight 234 lb (106.1 kg)   Nutrition Goal Cut back salt, heart healthy diet, portion control   Comment Craig [name] has reduced his sodium bu 90%!  He limits his sweets.  He needs to work on portion control to help with his weight loss.  He continues to decline a nutrtion appointment.  He has cut back, but knows  he still needs to watch portions.    Expected Outcome Short: Start to focus on portion control.  Long: Continue  with heart healthy diet.       Psychosocial: Target Goals: Acknowledge presence or absence of significant depression and/or stress, maximize coping skills, provide positive support system. Participant is able to verbalize types and ability to use techniques and skills needed for reducing stress and depression.   Initial Review & Psychosocial Screening:     Initial Psych Review & Screening - 09/12/16 1345      Initial Review   Current issues with None Identified     Family Dynamics   Good Support System? Yes  wife     Barriers   Psychosocial barriers to participate in program There are no identifiable barriers or psychosocial needs.     Screening Interventions   Interventions Encouraged to exercise;Program counselor consult      Quality of Life Scores:      Quality of Life - 11/19/16 1144      Quality of Life Scores   Health/Function Pre 20.79 %   Health/Function Post 23.93 %   Health/Function % Change 15.1 %   Socioeconomic Pre 21 %   Socioeconomic Post 26.57 %   Socioeconomic % Change  26.52 %   Psych/Spiritual Pre 21 %   Psych/Spiritual Post 28.29 %   Psych/Spiritual % Change 34.71 %   Family Pre 21 %   Family Post 22.8 %   Family % Change 8.57 %   GLOBAL Pre 20.91 %   GLOBAL Post 25.21 %   GLOBAL % Change 20.56 %      PHQ-9: Recent Review Flowsheet Data    Depression screen [facility] 2/9 11/19/2016 09/12/2016   Decreased Interest 1 2   Down, Depressed, Hopeless 0 0   PHQ - 2 Score 1 2   Altered sleeping 0 1   Tired, decreased energy 1 3   Change in appetite 0 0   Feeling bad or failure about yourself  0 0   Trouble concentrating 0 0   Moving slowly or fidgety/restless 0 1   Suicidal thoughts 0 0   PHQ-9 Score 2 7   Difficult doing work/chores Not difficult at all Somewhat difficult     Interpretation of Total Score  Total Score Depression Severity:  1-4 = Minimal depression, 5-9 = Mild depression, 10-14 = Moderate depression, 15-19 = Moderately severe  depression, 20-27 = Severe depression   Psychosocial Evaluation and Intervention:     Psychosocial Evaluation - 10/08/16 0944      Psychosocial Evaluation & Interventions   Interventions Encouraged to exercise with the program and follow exercise prescription   Comments Counselor met with Craig [name]) today for initial psychosocial evaluation.  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  He has a strong support system with a spouse of 58 years and several adult children and grandchildren who live locally, as well as Craig [name] is actively involved in his local church.  He has several other health issues that impact him at this time with prostate surgery a year ago and the need for knee surgery upon completion of this program.  Craig [name] has a good appetite but reports he has intermittent sleep subsequent to the prostate surgery.  He typically gets about 6 hours/night.  Craig [name] denies a history of depression or anxiety or any current symptoms, and other than his health, he reports minimal stress in his life currently.  He has goals to improve his stamina and strength while in this program.  He will be followed by staff.     Expected Outcomes Craig [name] will benefit from consistent exercise to achieve his stated goals. The educational and psychoeducational components of this program will be helpful as well.  Craig [name] will be followed by staff.   Continue Psychosocial Services  Follow up required by staff      Psychosocial Re-Evaluation:     Psychosocial Re-Evaluation    [place] Name 10/25/16 1353 11/14/16 0842           Psychosocial Re-Evaluation   Current issues with None Identified Current Stress Concerns;Current Sleep Concerns      Comments NO problmes  Craig [name] is starting to sleep better as he is not getting up as much at night.  He continues to have a good appetite.    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  He is making the best of it and maintaining a  postive attitude.   The biggest benefit of coming to rehab has been getting more exercise and helping all over.       Expected Outcomes Short and long term goald Cont to have minimal stress and be aware to report any concerns Short: Continue to attend class and sleep better.  Long: Continue to maintain postive attitude and cope well.      Interventions  - Encouraged to attend Cardiac Rehabilitation for the exercise;Stress management education      Continue Psychosocial Services   - Follow up required by staff        Initial Review   Source of Stress Concerns  - Occupation         Psychosocial Discharge (Final Psychosocial Re-Evaluation):     Psychosocial Re-Evaluation - 11/14/16 0842      Psychosocial Re-Evaluation   Current issues with Current Stress Concerns;Current Sleep Concerns   Comments Craig [name] is starting to sleep better as he is not getting up as much at night.  He continues to have a good appetite.    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  He is making the best of it and maintaining a postive attitude.   The biggest benefit of coming to rehab has been getting more exercise and helping all over.    Expected Outcomes Short: Continue to attend class and sleep better.  Long: Continue to maintain postive attitude and cope well.   Interventions Encouraged to attend Cardiac Rehabilitation for the exercise;Stress management education   Continue Psychosocial Services  Follow up required by staff     Initial Review   Source of Stress Concerns Occupation      Vocational Rehabilitation: Provide vocational rehab assistance to qualifying candidates.   Vocational Rehab Evaluation & Intervention:     Vocational Rehab - 09/12/16 1353      Initial Vocational Rehab Evaluation & Intervention   Assessment shows need for Vocational Rehabilitation No      Education: Education Goals: Education classes will be provided on a variety of topics geared  toward better understanding of heart health and risk factor modification. Participant will state understanding/return demonstration of topics presented as noted by education test scores.  Learning Barriers/Preferences:     Learning Barriers/Preferences - 09/12/16 1352      Learning Barriers/Preferences   Learning Barriers None   Learning Preferences Verbal Instruction      Education Topics: General Nutrition Guidelines/Fats and Fiber: -Group instruction provided  by verbal, written material, models and posters to present the general guidelines for heart healthy nutrition. Gives an explanation and review of dietary fats and fiber.   Cardiac Rehab from 11/21/2016 in [facility] Cardiac and Pulmonary Rehab  Date  10/08/16  Educator  CR  Instruction Review Code  1- Verbalizes Understanding      Controlling Sodium/Reading Food Labels: -Group verbal and written material supporting the discussion of sodium use in heart healthy nutrition. Review and explanation with models, verbal and written materials for utilization of the food label.   Cardiac Rehab from 11/21/2016 in [facility] Cardiac and Pulmonary Rehab  Date  10/15/16  Educator  PI  Instruction Review Code  2- Demonstrated Understanding      Exercise Physiology & Risk Factors: - Group verbal and written instruction with models to review the exercise physiology of the cardiovascular system and associated critical values. Details cardiovascular disease risk factors and the goals associated with each risk factor.   Cardiac Rehab from 11/21/2016 in [facility] Cardiac and Pulmonary Rehab  Date  10/24/16  Educator  [facility]  Instruction Review Code  1- Verbalizes Understanding      Aerobic Exercise & Resistance Training: - Gives group verbal and written discussion on the health impact of inactivity. On the components of aerobic and resistive training programs and the benefits of this training and how to safely progress through these programs.   Cardiac Rehab  from 11/21/2016 in [facility] Cardiac and Pulmonary Rehab  Date  10/29/16  Educator  [facility]  Instruction Review Code  1- Verbalizes Understanding      Flexibility, Balance, General Exercise Guidelines: - Provides group verbal and written instruction on the benefits of flexibility and balance training programs. Provides general exercise guidelines with specific guidelines to those with heart or lung disease. Demonstration and skill practice provided.   Stress Management: - Provides group verbal and written instruction about the health risks of elevated stress, cause of high stress, and healthy ways to reduce stress.   Cardiac Rehab from 11/21/2016 in [facility] Cardiac and Pulmonary Rehab  Date  11/05/16  Educator  [facility]  Instruction Review Code  1- Verbalizes Understanding      Depression: - Provides group verbal and written instruction on the correlation between heart/lung disease and depressed mood, treatment options, and the stigmas associated with seeking treatment.   Cardiac Rehab from 11/21/2016 in [facility] Cardiac and Pulmonary Rehab  Date  10/03/16  Educator  [facility]  Instruction Review Code (retired)  1- partially meets, needs review/practice      Anatomy & Physiology of the Heart: - Group verbal and written instruction and models provide basic cardiac anatomy and physiology, with the coronary electrical and arterial systems. Review of: AMI, Angina, Valve disease, Heart Failure, Cardiac Arrhythmia, Pacemakers, and the ICD.   Cardiac Rehab from 11/21/2016 in [facility] Cardiac and Pulmonary Rehab  Date  11/07/16  Educator  [facility]  Instruction Review Code  1- Verbalizes Understanding      Cardiac Procedures: - Group verbal and written instruction to review commonly prescribed medications for heart disease. Reviews the medication, class of the drug, and side effects. Includes the steps to properly store meds and maintain the prescription regimen. (beta blockers and nitrates)   Cardiac Rehab from 11/21/2016 in  [facility] Cardiac and Pulmonary Rehab  Date  11/12/16  Educator  SB  Instruction Review Code  1- Verbalizes Understanding      Cardiac Medications I: - Group verbal and written instruction to review commonly prescribed medications  for heart disease. Reviews the medication, class of the drug, and side effects. Includes the steps to properly store meds and maintain the prescription regimen.   Cardiac Rehab from 11/21/2016 in [facility] Cardiac and Pulmonary Rehab  Date  11/19/16 [8/9 Part Two]  Educator  MA  Instruction Review Code  1- Verbalizes Understanding      Cardiac Medications II: -Group verbal and written instruction to review commonly prescribed medications for heart disease. Reviews the medication, class of the drug, and side effects. (all other drug classes)    Go Sex-Intimacy & Heart Disease, Get SMART - Goal Setting: - Group verbal and written instruction through game format to discuss heart disease and the return to sexual intimacy. Provides group verbal and written material to discuss and apply goal setting through the application of the S.M.A.R.T. Method.   Cardiac Rehab from 11/21/2016 in [facility] Cardiac and Pulmonary Rehab  Date  11/12/16  Educator  SB  Instruction Review Code  1- Verbalizes Understanding      Other Matters of the Heart: - Provides group verbal, written materials and models to describe Heart Failure, Angina, Valve Disease, Peripheral Artery Disease, and Diabetes in the realm of heart disease. Includes description of the disease process and treatment options available to the cardiac patient.   Cardiac Rehab from 11/21/2016 in [facility] Cardiac and Pulmonary Rehab  Date  11/07/16  Educator  [facility]  Instruction Review Code  1- Verbalizes Understanding      Exercise & Equipment Safety: - Individual verbal instruction and demonstration of equipment use and safety with use of the equipment.   Cardiac Rehab from 11/21/2016 in [facility] Cardiac and Pulmonary Rehab  Date  09/12/16   Educator  [facility]      Infection Prevention: - Provides verbal and written material to individual with discussion of infection control including proper hand washing and proper equipment cleaning during exercise session.   Cardiac Rehab from 11/21/2016 in [facility] Cardiac and Pulmonary Rehab  Date  09/12/16  Educator  [facility]      Falls Prevention: - Provides verbal and written material to individual with discussion of falls prevention and safety.   Cardiac Rehab from 11/21/2016 in [facility] Cardiac and Pulmonary Rehab  Date  09/12/16  Educator  [facility]  Instruction Review Code (retired)  2- meets goals/outcomes      Diabetes: - Individual verbal and written instruction to review signs/symptoms of diabetes, desired ranges of glucose level fasting, after meals and with exercise. Acknowledge that pre and post exercise glucose checks will be done for 3 sessions at entry of program.   Other: -Provides group and verbal instruction on various topics (see comments)    Knowledge Questionnaire Score:     Knowledge Questionnaire Score - 11/19/16 1144      Knowledge Questionnaire Score   Pre Score 20/28   Post Score 26/28      Core Components/Risk Factors/Patient Goals at Admission:     Personal Goals and Risk Factors at Admission - 09/12/16 1333      Core Components/Risk Factors/Patient Goals on Admission   Hypertension Yes   Intervention Provide education on lifestyle modifcations including regular physical activity/exercise, weight management, moderate sodium restriction and increased consumption of fresh fruit, vegetables, and low fat dairy, alcohol moderation, and smoking cessation.;Monitor prescription use compliance.   Expected Outcomes Short Term: Continued assessment and intervention until BP is < 140/50m HG in hypertensive participants. < 130/833mHG in hypertensive participants with diabetes, heart failure or chronic kidney disease.;Long Term: Maintenance  of blood pressure at goal levels.    Lipids Yes   Intervention Provide education and support for participant on nutrition & aerobic/resistive exercise along with prescribed medications to achieve LDL <61m, HDL >436m   Expected Outcomes Short Term: Participant states understanding of desired cholesterol values and is compliant with medications prescribed. Participant is following exercise prescription and nutrition guidelines.;Long Term: Cholesterol controlled with medications as prescribed, with individualized exercise RX and with personalized nutrition plan. Value goals: LDL < 7060mHDL > 40 mg.   Stress Yes   Intervention Offer individual and/or small group education and counseling on adjustment to heart disease, stress management and health-related lifestyle change. Teach and support self-help strategies.;Refer participants experiencing significant psychosocial distress to appropriate mental health specialists for further evaluation and treatment. When possible, include family members and significant others in education/counseling sessions.   Expected Outcomes Short Term: Participant demonstrates changes in health-related behavior, relaxation and other stress management skills, ability to obtain effective social support, and compliance with psychotropic medications if prescribed.;Long Term: Emotional wellbeing is indicated by absence of clinically significant psychosocial distress or social isolation.      Core Components/Risk Factors/Patient Goals Review:      Goals and Risk Factor Review    Row Name 10/17/16 1337 11/14/16 0833           Core Components/Risk Factors/Patient Goals Review   Personal Goals Review Other;Hypertension Weight Management/Obesity;Hypertension;Lipids      Review Phils BP has been good during HT measurements.  He sees his PCP tomorrow and will have results of other blood work.  He sees Dr HooMarry Guanday about knee replacement, although it will be when he finishes HT.  He has been walking some at home and  doing strength and stretching. Weight has been up and down.  Today it was 234 lbs which is up a little.  Blood pressures have been good and staying within normal limits.  He continues to check it at home routinely.  Knee replacement is scheduled for end of Novemeber.  He feels that his meds continue to work well for him.      Expected Outcomes Short - PhiAbbe Amsterdamll continue to attend HT. Long - Craig [name] will graduate and maintain his exercise after knee surgery. Short:  Continue to work on weight loss.  Long: Continue to work on risk factor modifications.         Core Components/Risk Factors/Patient Goals at Discharge (Final Review):      Goals and Risk Factor Review - 11/14/16 0833      Core Components/Risk Factors/Patient Goals Review   Personal Goals Review Weight Management/Obesity;Hypertension;Lipids   Review Weight has been up and down.  Today it was 234 lbs which is up a little.  Blood pressures have been good and staying within normal limits.  He continues to check it at home routinely.  Knee replacement is scheduled for end of Novemeber.  He feels that his meds continue to work well for him.   Expected Outcomes Short:  Continue to work on weight loss.  Long: Continue to work on risk factor modifications.      ITP Comments:     ITP Comments    Row Name 09/12/16 1325 10/02/16 0620 10/30/16 1118 11/27/16 0643     ITP Comments Med Review completed. Initial ITP created. Diagnosis can be found in [place] 30 day review. Continue with ITP unless directed changes per Medical Director review  30 day review. Continue with ITP unless directed  changes per Medical Director review.   30 day Review. Continue with ITP unless directed changes per Medical Director Review.        Comments:

## 2016-12-03 ENCOUNTER — Encounter: Payer: Medicare Other | Admitting: *Deleted

## 2016-12-03 DIAGNOSIS — Z951 Presence of aortocoronary bypass graft: Secondary | ICD-10-CM

## 2016-12-03 DIAGNOSIS — Z48812 Encounter for surgical aftercare following surgery on the circulatory system: Secondary | ICD-10-CM | POA: Diagnosis not present

## 2016-12-03 NOTE — Progress Notes (Signed)
Daily Session Note  Patient Details  Name: Craig [name] MRN: [id number] Date of Birth: April 25, 1945 Referring Provider:     Cardiac Rehab from 09/12/2016 in [facility] Cardiac and Pulmonary Rehab  Referring Provider  [facility]      Encounter Date: 12/03/2016  Check In:     Session Check In - 12/03/16 0842      Check-In   Location [facility]-Cardiac & Pulmonary Rehab   Staff Present Renita Papa, RN BSN;Joseph Hood RCP,RRT,BSRT;Diane [facility] RN,BSN   Supervising physician immediately available to respond to emergencies See telemetry face sheet for immediately available ER MD   Medication changes reported     No   Fall or balance concerns reported    No   Warm-up and Cool-down Performed on first and last piece of equipment   Resistance Training Performed Yes   VAD Patient? No     Pain Assessment   Currently in Pain? No/denies         History  Smoking Status  . Former Smoker  . Packs/day: 1.50  . Years: 20.00  . Quit date: 09/25/1983  Smokeless Tobacco  . Never Used    Goals Met:  Independence with exercise equipment Exercise tolerated well No report of cardiac concerns or symptoms Strength training completed today  Goals Unmet:  Not Applicable  Comments: Pt able to follow exercise prescription today without complaint.  Will continue to monitor for progression.    Dr. Emily Filbert is Medical Director for [place] and [facility].

## 2016-12-05 ENCOUNTER — Encounter: Payer: Medicare Other | Admitting: *Deleted

## 2016-12-05 VITALS — Ht 70.25 in | Wt 234.0 lb

## 2016-12-05 DIAGNOSIS — Z48812 Encounter for surgical aftercare following surgery on the circulatory system: Secondary | ICD-10-CM | POA: Diagnosis not present

## 2016-12-05 DIAGNOSIS — Z951 Presence of aortocoronary bypass graft: Secondary | ICD-10-CM

## 2016-12-05 NOTE — Progress Notes (Signed)
Daily Session Note  Patient Details  Name: Craig [name] MRN: [id number] Date of Birth: 06/15/1945 Referring Provider:     Cardiac Rehab from 09/12/2016 in [facility] Cardiac and Pulmonary Rehab  Referring Provider  [facility]      Encounter Date: 12/05/2016  Check In:     Session Check In - 12/05/16 0936      Check-In   Location [facility]-Cardiac & Pulmonary Rehab   Staff Present Nada Maclachlan, BA, ACSM CEP, Exercise Physiologist;Filimon Miranda Frederico Hamman, RN BSN;Meredith Sherryll Burger, RN BSN   Supervising physician immediately available to respond to emergencies See telemetry face sheet for immediately available ER MD   Medication changes reported     No   Fall or balance concerns reported    No   Tobacco Cessation No Change   Warm-up and Cool-down Performed on first and last piece of equipment   Resistance Training Performed Yes   VAD Patient? No     VAD patient   Has back up controller? No     Pain Assessment   Currently in Pain? No/denies   Multiple Pain Sites No           Exercise Prescription Changes - 12/04/16 1200      Response to Exercise   Blood Pressure (Admit) 144/78   Blood Pressure (Exercise) 134/67   Blood Pressure (Exit) 126/64   Heart Rate (Admit) 67 bpm   Heart Rate (Exercise) 70 bpm   Heart Rate (Exit) 60 bpm   Rating of Perceived Exertion (Exercise) 13   Symptoms none   Duration Continue with 45 min of aerobic exercise without signs/symptoms of physical distress.   Intensity THRR unchanged     Progression   Progression Continue to progress workloads to maintain intensity without signs/symptoms of physical distress.   Average METs 2.04     Resistance Training   Training Prescription Yes   Weight 3 lb   Reps 10-15     Interval Training   Interval Training No     Treadmill   MPH 1.3   Grade 0   Minutes 15   METs 2     NuStep   Level 1   Minutes 15   METs 2.3     Home Exercise Plan   Plans to continue exercise at Home (comment)  walking and chair  exercises   Frequency Add 2 additional days to program exercise sessions.   Initial Home Exercises Provided 10/01/16      History  Smoking Status  . Former Smoker  . Packs/day: 1.50  . Years: 20.00  . Quit date: 09/25/1983  Smokeless Tobacco  . Never Used    Goals Met:  Independence with exercise equipment Exercise tolerated well No report of cardiac concerns or symptoms Strength training completed today  Goals Unmet:  Not Applicable  Comments: Pt able to follow exercise prescription today without complaint.  Will continue to monitor for progression.    Dr. Emily Filbert is Medical Director for [place] and [facility].

## 2016-12-05 NOTE — Progress Notes (Signed)
Daily Session Note  Patient Details  Name: Craig [name] MRN: [id number] Date of Birth: 03-19-1945 Referring Provider:     Cardiac Rehab from 09/12/2016 in [facility] Cardiac and Pulmonary Rehab  Referring Provider  [facility]      Encounter Date: 12/05/2016  Check In:     Session Check In - 12/05/16 0936      Check-In   Location [facility]-Cardiac & Pulmonary Rehab   Staff Present Craig [name], BA, ACSM CEP, Exercise Physiologist;Craig Frederico Hamman, RN BSN;Craig Sherryll Burger, RN BSN   Supervising physician immediately available to respond to emergencies See telemetry face sheet for immediately available ER MD   Medication changes reported     No   Fall or balance concerns reported    No   Tobacco Cessation No Change   Warm-up and Cool-down Performed on first and last piece of equipment   Resistance Training Performed Yes   VAD Patient? No     VAD patient   Has back up controller? No     Pain Assessment   Currently in Pain? No/denies   Multiple Pain Sites No           Exercise Prescription Changes - 12/04/16 1200      Response to Exercise   Blood Pressure (Admit) 144/78   Blood Pressure (Exercise) 134/67   Blood Pressure (Exit) 126/64   Heart Rate (Admit) 67 bpm   Heart Rate (Exercise) 70 bpm   Heart Rate (Exit) 60 bpm   Rating of Perceived Exertion (Exercise) 13   Symptoms none   Duration Continue with 45 min of aerobic exercise without signs/symptoms of physical distress.   Intensity THRR unchanged     Progression   Progression Continue to progress workloads to maintain intensity without signs/symptoms of physical distress.   Average METs 2.04     Resistance Training   Training Prescription Yes   Weight 3 lb   Reps 10-15     Interval Training   Interval Training No     Treadmill   MPH 1.3   Grade 0   Minutes 15   METs 2     NuStep   Level 1   Minutes 15   METs 2.3     Home Exercise Plan   Plans to continue exercise at Home (comment)  walking and chair  exercises   Frequency Add 2 additional days to program exercise sessions.   Initial Home Exercises Provided 10/01/16      History  Smoking Status  . Former Smoker  . Packs/day: 1.50  . Years: 20.00  . Quit date: 09/25/1983  Smokeless Tobacco  . Never Used    Goals Met:  Independence with exercise equipment Exercise tolerated well No report of cardiac concerns or symptoms Strength training completed today  Goals Unmet:  Not Applicable  Comments:      Craig [name] Name 09/12/16 1423 12/05/16 1026       6 Minute Walk   Phase Initial Discharge    Distance 795 feet 1125 feet    Distance % Change  - 41.5 %    Distance Feet Change  - 330 ft    Walk Time 6 minutes 6 minutes    # of Rest Breaks 0 0    MPH 1.5 2.13    METS 1.8 2.19    RPE 13 12    VO2 Peak 6.2 7.7    Symptoms Yes (comment) No    Comments knee pain 4/10  L knee did no use cane    Resting HR 58 bpm 51 bpm    Resting BP 122/84 140/70    Max Ex. HR 94 bpm 85 bpm    Max Ex. BP 148/86 142/80    2 Minute Post BP 134/82  -         Dr. Emily [name] is Medical Director for [place] and [facility].

## 2016-12-06 ENCOUNTER — Other Ambulatory Visit: Payer: Self-pay

## 2016-12-06 ENCOUNTER — Encounter: Payer: Self-pay | Admitting: Emergency Medicine

## 2016-12-06 ENCOUNTER — Observation Stay
Admission: EM | Admit: 2016-12-06 | Discharge: 2016-12-07 | Disposition: A | Discharge status: Home / Self Care | Payer: Medicare Other | Attending: Internal Medicine | Admitting: Internal Medicine

## 2016-12-06 ENCOUNTER — Emergency Department: Payer: Medicare Other

## 2016-12-06 DIAGNOSIS — Z7901 Long term (current) use of anticoagulants: Secondary | ICD-10-CM | POA: Diagnosis not present

## 2016-12-06 DIAGNOSIS — I480 Paroxysmal atrial fibrillation: Principal | ICD-10-CM | POA: Insufficient documentation

## 2016-12-06 DIAGNOSIS — N183 Chronic kidney disease, stage 3 (moderate): Secondary | ICD-10-CM | POA: Insufficient documentation

## 2016-12-06 DIAGNOSIS — N400 Enlarged prostate without lower urinary tract symptoms: Secondary | ICD-10-CM | POA: Insufficient documentation

## 2016-12-06 DIAGNOSIS — K91840 Postprocedural hemorrhage and hematoma of a digestive system organ or structure following a digestive system procedure: Secondary | ICD-10-CM | POA: Diagnosis not present

## 2016-12-06 DIAGNOSIS — Z8673 Personal history of transient ischemic attack (TIA), and cerebral infarction without residual deficits: Secondary | ICD-10-CM | POA: Diagnosis not present

## 2016-12-06 DIAGNOSIS — I2510 Atherosclerotic heart disease of native coronary artery without angina pectoris: Secondary | ICD-10-CM | POA: Diagnosis not present

## 2016-12-06 DIAGNOSIS — Z951 Presence of aortocoronary bypass graft: Secondary | ICD-10-CM | POA: Insufficient documentation

## 2016-12-06 DIAGNOSIS — M1710 Unilateral primary osteoarthritis, unspecified knee: Secondary | ICD-10-CM | POA: Diagnosis not present

## 2016-12-06 DIAGNOSIS — K1379 Other lesions of oral mucosa: Secondary | ICD-10-CM

## 2016-12-06 DIAGNOSIS — F329 Major depressive disorder, single episode, unspecified: Secondary | ICD-10-CM | POA: Diagnosis not present

## 2016-12-06 DIAGNOSIS — Y838 Other surgical procedures as the cause of abnormal reaction of the patient, or of later complication, without mention of misadventure at the time of the procedure: Secondary | ICD-10-CM | POA: Insufficient documentation

## 2016-12-06 DIAGNOSIS — I129 Hypertensive chronic kidney disease with stage 1 through stage 4 chronic kidney disease, or unspecified chronic kidney disease: Secondary | ICD-10-CM | POA: Diagnosis not present

## 2016-12-06 DIAGNOSIS — E7800 Pure hypercholesterolemia, unspecified: Secondary | ICD-10-CM | POA: Insufficient documentation

## 2016-12-06 DIAGNOSIS — Z7982 Long term (current) use of aspirin: Secondary | ICD-10-CM | POA: Insufficient documentation

## 2016-12-06 DIAGNOSIS — R001 Bradycardia, unspecified: Secondary | ICD-10-CM | POA: Diagnosis present

## 2016-12-06 DIAGNOSIS — J449 Chronic obstructive pulmonary disease, unspecified: Secondary | ICD-10-CM | POA: Diagnosis not present

## 2016-12-06 DIAGNOSIS — K219 Gastro-esophageal reflux disease without esophagitis: Secondary | ICD-10-CM | POA: Insufficient documentation

## 2016-12-06 DIAGNOSIS — Z87891 Personal history of nicotine dependence: Secondary | ICD-10-CM | POA: Diagnosis not present

## 2016-12-06 DIAGNOSIS — R531 Weakness: Secondary | ICD-10-CM

## 2016-12-06 HISTORY — DX: Atherosclerotic heart disease of native coronary artery without angina pectoris: I25.10

## 2016-12-06 LAB — COMPREHENSIVE METABOLIC PANEL
ALT: 20 U/L (ref 17–63)
AST: 20 U/L (ref 15–41)
Albumin: 3.7 g/dL (ref 3.5–5.0)
Alkaline Phosphatase: 79 U/L (ref 38–126)
Anion gap: 7 (ref 5–15)
BILIRUBIN TOTAL: 0.4 mg/dL (ref 0.3–1.2)
BUN: 24 mg/dL — AB (ref 6–20)
CHLORIDE: 107 mmol/L (ref 101–111)
CO2: 27 mmol/L (ref 22–32)
CREATININE: 1.25 mg/dL — AB (ref 0.61–1.24)
Calcium: 8.9 mg/dL (ref 8.9–10.3)
GFR, EST NON AFRICAN AMERICAN: 57 mL/min — AB (ref >60)
Glucose, Bld: 125 mg/dL — ABNORMAL HIGH (ref 65–99)
POTASSIUM: 4.3 mmol/L (ref 3.5–5.1)
Sodium: 141 mmol/L (ref 135–145)
TOTAL PROTEIN: 6.6 g/dL (ref 6.5–8.1)

## 2016-12-06 LAB — CBC WITH DIFFERENTIAL/PLATELET
BASOS ABS: 0 10*3/uL (ref 0–0.1)
Basophils Relative: 0 %
Eosinophils Absolute: 0.2 10*3/uL (ref 0–0.7)
Eosinophils Relative: 4 %
HEMATOCRIT: 38.8 % — AB (ref 40.0–52.0)
Hemoglobin: 13.2 g/dL (ref 13.0–18.0)
LYMPHS ABS: 1.7 10*3/uL (ref 1.0–3.6)
LYMPHS PCT: 30 %
MCH: 31.3 pg (ref 26.0–34.0)
MCHC: 33.9 g/dL (ref 32.0–36.0)
MCV: 92.1 fL (ref 80.0–100.0)
Monocytes Absolute: 0.6 10*3/uL (ref 0.2–1.0)
Monocytes Relative: 11 %
NEUTROS PCT: 55 %
Neutro Abs: 3.2 10*3/uL (ref 1.4–6.5)
PLATELETS: 164 10*3/uL (ref 150–440)
RBC: 4.21 MIL/uL — ABNORMAL LOW (ref 4.40–5.90)
RDW: 13.9 % (ref 11.5–14.5)
WBC: 5.8 10*3/uL (ref 3.8–10.6)

## 2016-12-06 LAB — TSH: TSH: 3.084 u[IU]/mL (ref 0.350–4.500)

## 2016-12-06 LAB — GLUCOSE, CAPILLARY: GLUCOSE-CAPILLARY: 144 mg/dL — AB (ref 65–99)

## 2016-12-06 LAB — TROPONIN I

## 2016-12-06 MED ORDER — DOCUSATE SODIUM 100 MG PO CAPS
100.0000 mg | ORAL_CAPSULE | Freq: Two times a day (BID) | ORAL | Status: DC
  Administered 2016-12-06 – 2016-12-07 (×2): 100 mg via ORAL
  Filled 2016-12-06 (×2): qty 1

## 2016-12-06 MED ORDER — FINASTERIDE 5 MG PO TABS
5.0000 mg | ORAL_TABLET | Freq: Every day | ORAL | Status: DC
  Administered 2016-12-06 – 2016-12-07 (×2): 5 mg via ORAL
  Filled 2016-12-06 (×2): qty 1

## 2016-12-06 MED ORDER — FERROUS SULFATE 325 (65 FE) MG PO TABS
325.0000 mg | ORAL_TABLET | Freq: Every day | ORAL | Status: DC
  Administered 2016-12-06 – 2016-12-07 (×2): 325 mg via ORAL
  Filled 2016-12-06 (×2): qty 1

## 2016-12-06 MED ORDER — SODIUM CHLORIDE 0.9 % IV BOLUS (SEPSIS)
1000.0000 mL | Freq: Once | INTRAVENOUS | Status: AC
  Administered 2016-12-06: 1000 mL via INTRAVENOUS

## 2016-12-06 MED ORDER — FLUOXETINE HCL 20 MG PO CAPS
20.0000 mg | ORAL_CAPSULE | Freq: Every day | ORAL | Status: DC
  Administered 2016-12-06 – 2016-12-07 (×2): 20 mg via ORAL
  Filled 2016-12-06 (×2): qty 1

## 2016-12-06 MED ORDER — SODIUM CHLORIDE 0.9 % IV SOLN
INTRAVENOUS | Status: DC
  Administered 2016-12-06 – 2016-12-07 (×3): via INTRAVENOUS

## 2016-12-06 MED ORDER — GABAPENTIN 100 MG PO CAPS: 100.0000 mg | ORAL_CAPSULE | Freq: Two times a day (BID) | ORAL | Status: DC | PRN

## 2016-12-06 MED ORDER — ONDANSETRON HCL 4 MG/2ML IJ SOLN: 4.0000 mg | Freq: Four times a day (QID) | INTRAMUSCULAR | Status: DC | PRN

## 2016-12-06 MED ORDER — TRAMADOL HCL 50 MG PO TABS
50.0000 mg | ORAL_TABLET | Freq: Three times a day (TID) | ORAL | Status: DC | PRN
  Administered 2016-12-06: 50 mg via ORAL
  Filled 2016-12-06: qty 1

## 2016-12-06 MED ORDER — PANTOPRAZOLE SODIUM 40 MG PO TBEC
40.0000 mg | DELAYED_RELEASE_TABLET | Freq: Two times a day (BID) | ORAL | Status: DC
  Administered 2016-12-06 – 2016-12-07 (×3): 40 mg via ORAL
  Filled 2016-12-06 (×3): qty 1

## 2016-12-06 MED ORDER — LORATADINE 10 MG PO TABS
10.0000 mg | ORAL_TABLET | Freq: Every day | ORAL | Status: DC
  Administered 2016-12-06 – 2016-12-07 (×2): 10 mg via ORAL
  Filled 2016-12-06 (×2): qty 1

## 2016-12-06 MED ORDER — APIXABAN 5 MG PO TABS: 5.0000 mg | ORAL_TABLET | Freq: Two times a day (BID) | ORAL | Status: DC

## 2016-12-06 MED ORDER — ACETAMINOPHEN 650 MG RE SUPP: 650.0000 mg | Freq: Four times a day (QID) | RECTAL | Status: DC | PRN

## 2016-12-06 MED ORDER — TAMSULOSIN HCL 0.4 MG PO CAPS
0.4000 mg | ORAL_CAPSULE | Freq: Every day | ORAL | Status: DC
  Administered 2016-12-06 – 2016-12-07 (×2): 0.4 mg via ORAL
  Filled 2016-12-06 (×2): qty 1

## 2016-12-06 MED ORDER — ADULT MULTIVITAMIN W/MINERALS CH
1.0000 | ORAL_TABLET | Freq: Every day | ORAL | Status: DC
  Administered 2016-12-06 – 2016-12-07 (×2): 1 via ORAL
  Filled 2016-12-06 (×2): qty 1

## 2016-12-06 MED ORDER — ASPIRIN EC 81 MG PO TBEC
81.0000 mg | DELAYED_RELEASE_TABLET | Freq: Every day | ORAL | Status: DC
  Filled 2016-12-06: qty 1

## 2016-12-06 MED ORDER — NITROGLYCERIN 2 % TD OINT
0.5000 [in_us] | TOPICAL_OINTMENT | Freq: Once | TRANSDERMAL | Status: AC
  Administered 2016-12-06: 0.5 [in_us] via TOPICAL
  Filled 2016-12-06: qty 1

## 2016-12-06 MED ORDER — ONDANSETRON HCL 4 MG/2ML IJ SOLN
INTRAMUSCULAR | Status: AC
  Administered 2016-12-06: 4 mg via INTRAVENOUS
  Filled 2016-12-06: qty 2

## 2016-12-06 MED ORDER — TRANEXAMIC ACID 1000 MG/10ML IV SOLN
500.0000 mg | Freq: Once | INTRAVENOUS | Status: AC
  Administered 2016-12-06: 500 mg via TOPICAL

## 2016-12-06 MED ORDER — ONDANSETRON HCL 4 MG PO TABS: 4.0000 mg | ORAL_TABLET | Freq: Four times a day (QID) | ORAL | Status: DC | PRN

## 2016-12-06 MED ORDER — HYDROCODONE-ACETAMINOPHEN 5-325 MG PO TABS
1.0000 | ORAL_TABLET | ORAL | Status: DC | PRN
  Administered 2016-12-07: 1 via ORAL
  Filled 2016-12-06: qty 1

## 2016-12-06 MED ORDER — FLUTICASONE PROPIONATE 50 MCG/ACT NA SUSP
1.0000 | Freq: Two times a day (BID) | NASAL | Status: DC
  Administered 2016-12-06 – 2016-12-07 (×3): 1 via NASAL
  Filled 2016-12-06: qty 16

## 2016-12-06 MED ORDER — LIDOCAINE-EPINEPHRINE-TETRACAINE (LET) SOLUTION
3.0000 mL | Freq: Once | NASAL | Status: AC
  Administered 2016-12-06: 3 mL via TOPICAL

## 2016-12-06 MED ORDER — AMIODARONE HCL 200 MG PO TABS
100.0000 mg | ORAL_TABLET | Freq: Two times a day (BID) | ORAL | Status: DC
  Administered 2016-12-06 – 2016-12-07 (×3): 100 mg via ORAL
  Filled 2016-12-06 (×3): qty 1

## 2016-12-06 MED ORDER — VITAMIN E 180 MG (400 UNIT) PO CAPS
400.0000 [IU] | ORAL_CAPSULE | Freq: Every day | ORAL | Status: DC
  Administered 2016-12-06 – 2016-12-07 (×2): 400 [IU] via ORAL
  Filled 2016-12-06 (×2): qty 1

## 2016-12-06 MED ORDER — METOPROLOL TARTRATE 25 MG PO TABS
12.5000 mg | ORAL_TABLET | Freq: Two times a day (BID) | ORAL | Status: DC
  Filled 2016-12-06: qty 1

## 2016-12-06 MED ORDER — ONDANSETRON HCL 4 MG/2ML IJ SOLN
4.0000 mg | Freq: Once | INTRAMUSCULAR | Status: AC
  Administered 2016-12-06: 4 mg via INTRAVENOUS

## 2016-12-06 MED ORDER — ACETAMINOPHEN 325 MG PO TABS
650.0000 mg | ORAL_TABLET | Freq: Four times a day (QID) | ORAL | Status: DC | PRN
Start: 2016-12-06 — End: 2016-12-07
  Administered 2016-12-06: 650 mg via ORAL
  Filled 2016-12-06: qty 2

## 2016-12-06 MED ORDER — ATORVASTATIN CALCIUM 20 MG PO TABS
80.0000 mg | ORAL_TABLET | Freq: Every day | ORAL | Status: DC
  Administered 2016-12-06: 80 mg via ORAL
  Filled 2016-12-06: qty 4

## 2016-12-06 MED ORDER — TRAZODONE HCL 50 MG PO TABS
50.0000 mg | ORAL_TABLET | Freq: Every day | ORAL | Status: DC
  Administered 2016-12-06: 50 mg via ORAL
  Filled 2016-12-06: qty 1

## 2016-12-06 MED ORDER — LIDOCAINE-EPINEPHRINE-TETRACAINE (LET) SOLUTION
NASAL | Status: AC
  Administered 2016-12-06: 3 mL
  Filled 2016-12-06: qty 3

## 2016-12-06 NOTE — Progress Notes (Signed)
Called to room by wife. Mouth bleeding. Applied new surgicel dressing with gauze. Continuing to bleed.  Attempting to apply gauze with cold water. Pressure applied. Continue to bleed, continuing to hold pressure with gauze with cold water until shift changed. Bed changed, gown changed. Report given to day shift Rn taking over care.

## 2016-12-06 NOTE — ED Notes (Signed)
Pt pale and diaphoretic. Heart rate <45. 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

## 2016-12-06 NOTE — ED Triage Notes (Addendum)
Pt to room 2 via w/c, actively bleeding from mouth; wife reports pt had 5 teeth pulled last Wednesday with no complications; at 8pm began having bleeding with no known cause; pt denies pain, denies any injury, and st that he is not bleeding from his gums but from the roof of mouth; currently taking eloquist; Dr Beather Arbour called to room

## 2016-12-06 NOTE — ED Provider Notes (Signed)
[facility] Emergency Department Provider Note   ____________________________________________   First MD Initiated Contact with Patient 12/06/16 0144     (approximate)  I have reviewed the triage vital signs and the nursing notes.   HISTORY  Chief Complaint Mouth Injury    HPI Craig [name] is a 71 y.o. male who presents to the ED from home with a chief complaint of oral bleeding. Patient has a history of CAD status post CABG, on Eliquiswho had 5 upper teeth pulled over one week ago and subsequently had plants placed. Wife states patient has had no trouble with further bleeding other than he was slow to stop bleeding the day after surgery because he was still on Eliquis. Wife reports approximately 8 PM patient had slow trickle of bleeding that he has been dealing with all night. Bleeding would stop, then start. Patient denies eating anything sharp or crunchy tonight. presents to the ED because bleeding worsened. Denies weakness, lightheadedness, chest pain, shortness of breath, abdominal pain, nausea or vomiting. Denies recent trauma. Nothing makes his bleeding better or worse.   Past Medical History:  Diagnosis Date  . Anginal pain ([place])   . BPH (benign prostatic hyperplasia)   . Cancer ([place])    skin cancer   . Chronic kidney disease   . GERD (gastroesophageal reflux disease)   . History of hiatal hernia   . Hypercholesterolemia   . Hypertension     Patient Active Problem List   Diagnosis Date Noted  . History of stroke 08/27/2016  . Low iron stores 07/08/2016  . Anemia of chronic disease 07/08/2016  . CVA (cerebral vascular accident) ([place]) 07/06/2016  . Postoperative anemia due to acute blood loss 07/03/2016  . S/P coronary artery bypass graft x 3 07/03/2016  . S/P TURP 07/03/2016  . Urethral false passage 07/03/2016  . Coronary artery disease involving native coronary artery 07/02/2016  . Schatzki's ring 07/01/2016  . COPD (chronic  obstructive pulmonary disease) ([place]) 07/01/2016  . Hydronephrosis, left 11/07/2015  . Sepsis ([place]) 09/29/2015  . Acute pyelonephritis 09/25/2015  . Frequency of urination 05/29/2015  . BPH (benign prostatic hyperplasia) 09/05/2014  . Hyperlipidemia 09/05/2014  . Enlarged prostate with urinary obstruction 07/03/2014  . History of total knee replacement 06/02/2014  . Status post total right knee replacement 06/02/2014  . Osteoarthritis of knee 05/12/2014  . HTN (hypertension) 06/28/2013  . Hypertension 06/28/2013    Past Surgical History:  Procedure Laterality Date  . IR GENERIC HISTORICAL  09/29/2015   IR NEPHROSTOMY PLACEMENT LEFT 09/29/2015 [name], MD [facility]INTER[facility] RAD  . IR GENERIC HISTORICAL  10/20/2015   IR NEPHROSTOGRAM LEFT THRU EXISTING ACCESS 10/20/2015 [facility]  . JOINT REPLACEMENT    . LAPAROSCOPIC NEPHRECTOMY, HAND ASSISTED Left 11/07/2015   Procedure: HAND ASSISTED LAPAROSCOPIC NEPHRECTOMY;  Surgeon: Hollice Espy, MD;  Location: [facility] ORS;  Service: Urology;  Laterality: Left;  . LEFT HEART CATH AND CORONARY ANGIOGRAPHY N/A 06/28/2016   Procedure: Left Heart Cath and Coronary Angiography;  Surgeon: Yolonda Kida, MD;  Location: [facility];  Service: Cardiovascular;  Laterality: N/A;  . PROSTATE ABLATION N/A 04/10/2016   Procedure: PROSTATE ABLATION;  Surgeon: Hollice Espy, MD;  Location: [facility] ORS;  Service: Urology;  Laterality: N/A;  . REPLACEMENT TOTAL KNEE Right   . THULIUM LASER TURP (TRANSURETHRAL RESECTION OF PROSTATE) N/A 04/10/2016   Procedure: THULIUM LASER TURP (TRANSURETHRAL RESECTION OF PROSTATE);  Surgeon: Hollice Espy, MD;  Location: [facility] ORS;  Service: Urology;  Laterality: N/A;  . TONSILLECTOMY      Prior to Admission medications   Medication Sig Start Date End Date Taking? Authorizing Provider  aspirin EC 81 MG tablet Take 81 mg by mouth daily.   Yes [provider]  cetirizine (ZYRTEC) 10 MG tablet Take 10 mg by mouth  daily.   Yes [provider]  ferrous sulfate 325 (65 FE) MG tablet Take 325 mg by mouth daily with breakfast.   Yes [provider]  metoprolol tartrate (LOPRESSOR) 25 MG tablet Take 12.5 mg by mouth 2 (two) times daily.   Yes [provider]  Multiple Vitamin (MULTIVITAMIN) tablet Take 1 tablet by mouth daily.   Yes [provider]  traMADol (ULTRAM) 50 MG tablet Take 50 mg by mouth every 8 (eight) hours as needed for moderate pain.  11/17/15  Yes [provider]  vitamin E 100 UNIT capsule Take 100 Units by mouth daily.   Yes [provider]  amiodarone (PACERONE) 200 MG tablet Take 100 mg by mouth 2 (two) times daily.  08/05/16 08/05/17  [provider]  apixaban (ELIQUIS) 5 MG TABS tablet Take 5 mg by mouth 2 (two) times daily.  08/07/16   [provider]  atorvastatin (LIPITOR) 80 MG tablet Take 80 mg by mouth daily at 6 PM.  08/05/16 08/05/17  [provider]  docusate sodium (COLACE) 100 MG capsule Take 1 capsule (100 mg total) by mouth 2 (two) times daily. 04/10/16   Hollice Espy, MD  finasteride (PROSCAR) 5 MG tablet Take 5 mg by mouth daily.  08/05/16   [provider]  FLUoxetine (PROZAC) 20 MG capsule Take 20 mg by mouth daily.  08/05/16 08/05/17  [provider]  fluticasone (FLONASE) 50 MCG/ACT nasal spray Place 2 sprays into both nostrils daily.    [provider]  gabapentin (NEURONTIN) 100 MG capsule Take by mouth. 08/07/16 08/07/17  [provider]  isosorbide mononitrate (IMDUR) 30 MG 24 hr tablet Take 30 mg by mouth daily.  06/12/16   [provider]  lisinopril (PRINIVIL,ZESTRIL) 30 MG tablet Take 30 mg by mouth every morning.     [provider]  mirabegron ER (MYRBETRIQ) 25 MG TB24 tablet Take 1 tablet (25 mg total) by mouth daily. 10/16/16   Hollice Espy, MD  omeprazole (PRILOSEC) 40 MG capsule Take 40 mg by mouth 2 (two) times daily.    [provider]  simvastatin (ZOCOR) 40 MG tablet Take 40 mg by mouth daily at 6 PM.     [provider]  tamsulosin (FLOMAX) 0.4 MG CAPS capsule Take 0.4 mg by mouth daily.    [provider]  tiZANidine (ZANAFLEX) 2 MG tablet Take 2 mg by mouth every 8 (eight) hours as needed for muscle spasms.  06/17/16   [provider]    Allergies Peanut oil and Peanut-containing drug products  Family History  Problem Relation Age of Onset  . Kidney cancer Mother   . Lung cancer Father   . Prostate cancer Neg Hx   . Bladder Cancer Neg Hx     Social History Social History  Substance Use Topics  . Smoking status: Former Smoker    Packs/day: 1.50    Years: 20.00    Quit date: 09/25/1983  . Smokeless tobacco: Never Used  . Alcohol use No    Review of Systems  Constitutional: No fever/chills. Eyes: No visual changes. ENT: positive for oral bleeding. No sore throat. Cardiovascular:  Denies chest pain. Respiratory: Denies shortness of breath. Gastrointestinal: No abdominal pain.  No nausea, no vomiting.  No diarrhea.  No constipation. Genitourinary: Negative for dysuria. Musculoskeletal: Negative for back pain. Skin: Negative for rash. Neurological: Negative for headaches, focal weakness or numbness.   ____________________________________________   PHYSICAL EXAM:  VITAL SIGNS: ED Triage Vitals  Enc Vitals Group     BP 12/06/16 0119 (!) 170/108     Pulse Rate 12/06/16 0119 (!) 57     Resp 12/06/16 0119 18     Temp --      Temp src --      SpO2 12/06/16 0119 97 %     Weight 12/06/16 0117 234 lb (106.1 kg)     Height 12/06/16 0117 5\' 10"  (1.778 m)     Head Circumference --      Peak Flow --      Pain Score --      Pain Loc --      Pain Edu? --      Excl. in [place]? --     Constitutional: Alert and oriented. Well appearing and in mild acute distress. Eyes: Conjunctivae are normal. PERRL. EOMI. Head: Atraumatic. Nose: No  congestion/rhinnorhea. Mouth/Throat: After much difficulty, source of bleeding localized to right upper gum lateral aspect to tooth #6. Neck: No stridor.   Cardiovascular: Bradycardic rate, regular rhythm. Grossly normal heart sounds.  Good peripheral circulation. Respiratory: Normal respiratory effort.  No retractions. Lungs CTAB. Gastrointestinal: Soft and nontender. No distention. No abdominal bruits. No CVA tenderness. Musculoskeletal: No lower extremity tenderness nor edema.  No joint effusions. Neurologic:  Normal speech and language. No gross focal neurologic deficits are appreciated.  Skin:  Skin is warm, dry and intact. No rash noted. Psychiatric: Mood and affect are normal. Speech and behavior are normal.  ____________________________________________   LABS (all labs ordered are listed, but only abnormal results are displayed)  Labs Reviewed  CBC WITH DIFFERENTIAL/PLATELET - Abnormal; Notable for the following:       Result Value   RBC 4.21 (*)    HCT 38.8 (*)    All other components within normal limits  COMPREHENSIVE METABOLIC PANEL - Abnormal; Notable for the following:    Glucose, Bld 125 (*)    BUN 24 (*)    Creatinine, Ser 1.25 (*)    GFR calc non Af Amer 57 (*)    All other components within normal limits  GLUCOSE, CAPILLARY - Abnormal; Notable for the following:    Glucose-Capillary 144 (*)    All other components within normal limits  TROPONIN I   ____________________________________________  EKG  ED ECG REPORT I, Blaine Hari J, the attending physician, personally viewed and interpreted this ECG.   Date: 12/06/2016  EKG Time: 0141  Rate: 43  Rhythm: sinus bradycardia  Axis: normal  Intervals:none  ST&T Change: nonspecific  ____________________________________________  RADIOLOGY  Dg Chest Port 1 View  Result Date: 12/06/2016 CLINICAL DATA:  Weakness and hypoxia. Bleeding from the mouth. Head teeth pulled last Wednesday. EXAM: PORTABLE CHEST 1  VIEW COMPARISON:  09/29/2015 FINDINGS: Postoperative changes in the mediastinum. Cardiac enlargement. No vascular congestion or edema. No focal consolidation. No blunting of costophrenic angles. No pneumothorax. Tortuous aorta. Degenerative changes in the shoulders. Old fracture deformity of the right clavicle. IMPRESSION: Cardiac enlargement.  No evidence of active pulmonary disease. Electronically Signed   By: Lucienne Capers M.D.   On: 12/06/2016 02:31    ____________________________________________   PROCEDURES  Procedure(s) performed: None  Procedures  Critical Care performed: Yes, see critical care note(s)   CRITICAL CARE Performed by: Paulette Blanch   Total critical care time: 45 minutes  Critical care time was exclusive of separately billable procedures and treating other patients.  Critical care was necessary to treat or prevent imminent or life-threatening deterioration.  Critical care was time spent personally by me on the following activities: development of treatment plan with patient and/or surrogate as well as nursing, discussions with consultants, evaluation of patient's response to treatment, examination of patient, obtaining history from patient or surrogate, ordering and performing treatments and interventions, ordering and review of laboratory studies, ordering and review of radiographic studies, pulse oximetry and re-evaluation of patient's condition.  ____________________________________________   INITIAL IMPRESSION / ASSESSMENT AND PLAN / ED COURSE  As part of my medical decision making, I reviewed the following data within the [place] History obtained from family, Nursing notes reviewed and incorporated, EKG interpreted, Old chart reviewed, Radiograph reviewed, Discussed with admitting physician  and Notes from prior ED visits   71 year old male on Eliquis with recent oral surgery who presents with bleeding. Spent more than 45 minutes at  bedside controlling bleeding. Yankauer suction used to suction bleeding from oral cavity. Bleeding identified to upper gum lateral to tooth #6. A variety of anti-thrombotic material used including Surgicel 2, cold teabag, TXA, LET, all applied with direct pressure. a Complained of generalized weakness. At the time that hemostasis was achieved, patient became pale, nauseated and heart rate dropped from 60s to 29. IV fluids were infusing. He was given Zofran. He was placed on pacer pads and code cart placed in treatment room. Patient did recover his heart rate back to mid 50s. review of records shows that his resting heart rate is approximately high 50s. He is on a beta blocker. At this time we'll hold atropine and glucagon  Clinical Course as of Dec 06 298  Fri Dec 06, 2016  0258 Patient resting in no acute distress. No bleeding from oral cavity. Updated patient and spouse of laboratory and x-ray results.  [JS]    Clinical Course User Index [JS] Paulette Blanch, MD     ____________________________________________   FINAL CLINICAL IMPRESSION(S) / ED DIAGNOSES  Final diagnoses:  Oral bleeding  Weakness  Symptomatic sinus bradycardia      NEW MEDICATIONS STARTED DURING THIS VISIT:  New Prescriptions   No medications on file     Note:  This document was prepared using Dragon voice recognition software and may include unintentional dictation errors.    Paulette Blanch, MD 12/06/16 [phone]

## 2016-12-06 NOTE — Progress Notes (Signed)
Pt arrived via stretcher from ED. Pt A&O with no complaints of pain.Telemetry monitor applied and called to CCMD. Oriented to room, call bell.

## 2016-12-06 NOTE — H&P (Signed)
Craig [name] is an 71 y.o. male.   Chief Complaint: Mouth injury HPI: The patient with past medical history of CAD status post CABG in 2008, hypertension and atrial fibrillation status post row presents emergency department complaining of bleeding from his mouth. The patient underwent oral surgery a week and a half ago. He had some difficulty achieving hemostasis postoperatively but had been fine until today when his gums began bleeding sometime shortly after dinner. It took quite some time to achieve hemostasis in the emergency department. After bleeding was stopped the patient had episodes of bradycardia into the 20s. He did not appear to have vagal episodes at the time. Thus the emergency [place] staff called the hospitalist service for admission.  Past Medical History:  Diagnosis Date  . Anginal pain ([place])   . BPH (benign prostatic hyperplasia)   . CAD (coronary artery disease)   . Cancer ([place])    skin cancer   . Chronic kidney disease   . GERD (gastroesophageal reflux disease)   . History of hiatal hernia   . Hypercholesterolemia   . Hypertension     Past Surgical History:  Procedure Laterality Date  . CORONARY ARTERY BYPASS GRAFT  2018  . IR GENERIC HISTORICAL  09/29/2015   IR NEPHROSTOMY PLACEMENT LEFT 09/29/2015 [name], MD [facility]INTER[facility]  . IR GENERIC HISTORICAL  10/20/2015   IR NEPHROSTOGRAM LEFT THRU EXISTING ACCESS 10/20/2015 [facility]  . JOINT REPLACEMENT    . LAPAROSCOPIC NEPHRECTOMY, HAND ASSISTED Left 11/07/2015   Procedure: HAND ASSISTED LAPAROSCOPIC NEPHRECTOMY;  Surgeon: Hollice Espy, MD;  Location: [facility] ORS;  Service: Urology;  Laterality: Left;  . LEFT HEART CATH AND CORONARY ANGIOGRAPHY N/A 06/28/2016   Procedure: Left Heart Cath and Coronary Angiography;  Surgeon: Yolonda Kida, MD;  Location: [facility];  Service: Cardiovascular;  Laterality: N/A;  . PROSTATE ABLATION N/A 04/10/2016   Procedure: PROSTATE ABLATION;  Surgeon: Hollice Espy,  MD;  Location: [facility] ORS;  Service: Urology;  Laterality: N/A;  . REPLACEMENT TOTAL KNEE Right   . THULIUM LASER TURP (TRANSURETHRAL RESECTION OF PROSTATE) N/A 04/10/2016   Procedure: THULIUM LASER TURP (TRANSURETHRAL RESECTION OF PROSTATE);  Surgeon: Hollice Espy, MD;  Location: [facility] ORS;  Service: Urology;  Laterality: N/A;  . TONSILLECTOMY      Family History  Problem Relation Age of Onset  . Kidney cancer Mother   . Lung cancer Father   . Prostate cancer Neg Hx   . Bladder Cancer Neg Hx    Social History:  reports that he quit smoking about 33 years ago. He has a 30.00 pack-year smoking history. He has never used smokeless tobacco. He reports that he does not drink alcohol or use drugs.  Allergies:  Allergies  Allergen Reactions  . Peanut Oil Other (See Comments)    Mouth ulcers   . Peanut-Containing Drug Products Other (See Comments)    Mouth ulcers     Medications Prior to Admission  Medication Sig Dispense Refill  . amiodarone (PACERONE) 200 MG tablet Take 100 mg by mouth 2 (two) times daily.     Marland Kitchen apixaban (ELIQUIS) 5 MG TABS tablet Take 5 mg by mouth 2 (two) times daily.     Marland Kitchen aspirin EC 81 MG tablet Take 81 mg by mouth daily.    Marland Kitchen atorvastatin (LIPITOR) 80 MG tablet Take 80 mg by mouth at bedtime.     . cetirizine (ZYRTEC) 10 MG tablet Take 10 mg by mouth daily.    Marland Kitchen  ferrous sulfate 325 (65 FE) MG tablet Take 325 mg by mouth daily with breakfast.    . finasteride (PROSCAR) 5 MG tablet Take 5 mg by mouth daily.     Marland Kitchen FLUoxetine (PROZAC) 20 MG capsule Take 20 mg by mouth daily.     . fluticasone (FLONASE) 50 MCG/ACT nasal spray Place 1 spray into both nostrils 2 (two) times daily.   3  . gabapentin (NEURONTIN) 100 MG capsule Take 100 mg by mouth 2 (two) times daily as needed (pain).     . metoprolol tartrate (LOPRESSOR) 25 MG tablet Take 12.5 mg by mouth 2 (two) times daily.    . Multiple Vitamin (MULTIVITAMIN) tablet Take 1 tablet by mouth daily.    Marland Kitchen omeprazole  (PRILOSEC) 40 MG capsule Take 40 mg by mouth 2 (two) times daily.  10  . tamsulosin (FLOMAX) 0.4 MG CAPS capsule Take 0.4 mg by mouth daily.  3  . traMADol (ULTRAM) 50 MG tablet Take 50 mg by mouth every 8 (eight) hours as needed for moderate pain.   0  . traZODone (DESYREL) 50 MG tablet Take 50 mg by mouth at bedtime.    . vitamin E 400 UNIT capsule Take 400 Units by mouth daily.       Results for orders placed or performed during the hospital encounter of 12/06/16 (from the past 48 hour(s))  CBC with Differential     Status: Abnormal   Collection Time: 12/06/16  1:32 AM  Result Value Ref Range   WBC 5.8 3.8 - 10.6 K/uL   RBC 4.21 (L) 4.40 - 5.90 MIL/uL   Hemoglobin 13.2 13.0 - 18.0 g/dL   HCT 38.8 (L) 40.0 - 52.0 %   MCV 92.1 80.0 - 100.0 fL   MCH 31.3 26.0 - 34.0 pg   MCHC 33.9 32.0 - 36.0 g/dL   RDW 13.9 11.5 - 14.5 %   Platelets 164 150 - 440 K/uL   Neutrophils Relative % 55 %   Neutro Abs 3.2 1.4 - 6.5 K/uL   Lymphocytes Relative 30 %   Lymphs Abs 1.7 1.0 - 3.6 K/uL   Monocytes Relative 11 %   Monocytes Absolute 0.6 0.2 - 1.0 K/uL   Eosinophils Relative 4 %   Eosinophils Absolute 0.2 0 - 0.7 K/uL   Basophils Relative 0 %   Basophils Absolute 0.0 0 - 0.1 K/uL  Comprehensive metabolic panel     Status: Abnormal   Collection Time: 12/06/16  1:32 AM  Result Value Ref Range   Sodium 141 135 - 145 mmol/L   Potassium 4.3 3.5 - 5.1 mmol/L   Chloride 107 101 - 111 mmol/L   CO2 27 22 - 32 mmol/L   Glucose, Bld 125 (H) 65 - 99 mg/dL   BUN 24 (H) 6 - 20 mg/dL   Creatinine, Ser 1.25 (H) 0.61 - 1.24 mg/dL   Calcium 8.9 8.9 - 10.3 mg/dL   Total Protein 6.6 6.5 - 8.1 g/dL   Albumin 3.7 3.5 - 5.0 g/dL   AST 20 15 - 41 U/L   ALT 20 17 - 63 U/L   Alkaline Phosphatase 79 38 - 126 U/L   Total Bilirubin 0.4 0.3 - 1.2 mg/dL   GFR calc non Af Amer 57 (L) >60 mL/min   GFR calc Af Amer >60 >60 mL/min    Comment: (NOTE) The eGFR has been calculated using the CKD EPI equation. This  calculation has not been validated in all clinical situations. eGFR's persistently <  60 mL/min signify possible Chronic Kidney Disease.    Anion gap 7 5 - 15  Troponin I     Status: None   Collection Time: 12/06/16  1:32 AM  Result Value Ref Range   Troponin I <0.03 <0.03 ng/mL  TSH     Status: None   Collection Time: 12/06/16  1:32 AM  Result Value Ref Range   TSH 3.084 0.350 - 4.500 uIU/mL    Comment: Performed by a 3rd Generation assay with a functional sensitivity of <=0.01 uIU/mL.  Glucose, capillary     Status: Abnormal   Collection Time: 12/06/16  1:46 AM  Result Value Ref Range   Glucose-Capillary 144 (H) 65 - 99 mg/dL   Dg Chest Port 1 View  Result Date: 12/06/2016 CLINICAL DATA:  Weakness and hypoxia. Bleeding from the mouth. Head teeth pulled last Wednesday. EXAM: PORTABLE CHEST 1 VIEW COMPARISON:  09/29/2015 FINDINGS: Postoperative changes in the mediastinum. Cardiac enlargement. No vascular congestion or edema. No focal consolidation. No blunting of costophrenic angles. No pneumothorax. Tortuous aorta. Degenerative changes in the shoulders. Old fracture deformity of the right clavicle. IMPRESSION: Cardiac enlargement.  No evidence of active pulmonary disease. Electronically Signed   By: Lucienne Capers M.D.   On: 12/06/2016 02:31    Review of Systems  Constitutional: Negative for chills and fever.  HENT: Negative for sore throat and tinnitus.   Eyes: Negative for blurred vision and redness.  Respiratory: Negative for cough and shortness of breath.   Cardiovascular: Negative for chest pain, palpitations, orthopnea and PND.  Gastrointestinal: Negative for abdominal pain, diarrhea, nausea and vomiting.  Genitourinary: Negative for dysuria, frequency and urgency.  Musculoskeletal: Negative for joint pain and myalgias.  Skin: Negative for rash.       No lesions  Neurological: Negative for speech change, focal weakness and weakness.  Endo/Heme/Allergies: Does not  bruise/bleed easily.       No temperature intolerance  Psychiatric/Behavioral: Negative for depression and suicidal ideas.    Blood pressure (!) 144/96, pulse (!) 58, temperature (!) 97.5 F (36.4 C), temperature source Oral, resp. rate 17, height 5' 11"  (1.803 m), weight 104.2 kg (229 lb 11.2 oz), SpO2 96 %. Physical Exam  Vitals reviewed. Constitutional: He is oriented to person, place, and time. He appears well-developed and well-nourished. No distress.  HENT:  Head: Normocephalic and atraumatic.  Mouth/Throat: No oropharyngeal exudate.  Eyes: Pupils are equal, round, and reactive to light. Conjunctivae and EOM are normal. No scleral icterus.  Neck: Normal range of motion. Neck supple. No JVD present. No tracheal deviation present. No thyromegaly present.  Cardiovascular: Normal rate, regular rhythm and normal heart sounds.  Exam reveals no gallop and no friction rub.   No murmur heard. Respiratory: Effort normal and breath sounds normal. No respiratory distress.  GI: Soft. Bowel sounds are normal. He exhibits no distension. There is no tenderness.  Genitourinary:  Genitourinary Comments: Deferred  Musculoskeletal: Normal range of motion. He exhibits no edema.  Lymphadenopathy:    He has no cervical adenopathy.  Neurological: He is alert and oriented to person, place, and time. No cranial nerve deficit.  Skin: Skin is warm and dry. No rash noted. No erythema.  Psychiatric: He has a normal mood and affect. His behavior is normal. Judgment and thought content normal.     Assessment/Plan This is a 71 year old male admitted for bradycardia. 1. Bradycardia: Symptomatic; unclear etiology at this time. Continue to monitor telemetry. Consider glucagon if rate continues to drop. Cardiology  consult. 2. Hypertension: Likely contribute to oral bleeding as hemostasis is usually achieved blood pressure is lower. I placed Nitropaste on his chest. Continue metoprolol if cardiology agrees. 3.  Atrial fibrillation: Rate controlled; history of stroke. Continue amiodarone and Eliquis. The latter contributes to difficulty control bleeding from oral surgery. 4. Oral bleeding: Secondary to dental extraction. Exacerbated by anticoagulation. Continue surgiceal and direct pressure. 5. CAD: Status post recent CABG. Continue aspirin. 6. CKD: Stage III; stable. Avoid nephrotoxic agents. 7. BPH: Continue Flomax and finasteride 8. Hyperlipidemia: Continue statin therapy 9. Depression: Continue Prozac  10. DVT prophylaxis: Anticoagulation as above 11. GI prophylaxis: PPI per home regimen The patient is a full code. Time spent on admission orders and patient care possibly 45 minutes  Harrie Foreman, MD 12/06/2016, 9:02 AM

## 2016-12-06 NOTE — Care Management Obs Status (Signed)
MEDICARE OBSERVATION STATUS NOTIFICATION   Patient Details  Name: Craig [name] MRN: [id number] Date of Birth: [birth year]   Medicare Observation Status Notification Given:  Yes    Beverly Sessions, RN 12/06/2016, 3:29 PM

## 2016-12-06 NOTE — Progress Notes (Signed)
Same day note  Patient seen and examined.  Still has some oral bleeding going on. Improved. Heart rate in 40s to 50s. Seen by cardiology.  * Sinus bradycardia. We will stop metoprolol. Continue amiodarone. Appreciate cardiology input.  * Paroxysmal atrial fibrillation. Presently normal sinus rhythm. Eliquis stopped due to bleeding. Since he is in normal sinus rhythm can hold Eliquis until bleeding stops.  * Oral bleeding status post dental surgery. Eliquis stopped. Monitor.

## 2016-12-06 NOTE — Consult Note (Signed)
[facility] Cardiology Consultation Note  Patient ID: Craig [name], MRN: [id number], DOB/AGE: 70-Feb-1947 70 y.o. Admit date: 12/06/2016   Date of Consult: 12/06/2016 Primary Physician: Tracie Harrier, MD Primary Cardiologist: [facility]  Chief Complaint:  Chief Complaint  Patient presents with  . Mouth Injury   Reason for Consult: bradycardia  HPI: 71 y.o. male with known coronary artery disease status post coronary artery bypass graft earlier this year for which she had a complication of stroke which appears to be have the embolic in nature from atrial fibrillation status post surgery. The patient was placed on appropriate medication management at that time including amiodarone for which the patient was able to maintain normal sinus rhythm since that period of time. The patient recovered from the stroke fairly well with no residual problems. Additionally the patient was placed on metoprolol for hypertension and heart rate control but had multiple bouts of bradycardia with adjustments of that medication dose. The patient also was placed on elequis appropriately for of the this amount of time. The patient had stable issues with hypertension and hyperlipidemia on appropriate medication management. He did have some teeth pulled and some bridges placed several days ago for which the patient has done well. At that time the patient did have some bleeding and was seen in the emergency room with some fair amount of bleeding after taking his evening dosages of medication. At that time he also had significant bradycardia to heart rate of 30 bpm consistent with metoprolol useamiodarone use and vagal response due to the bleeding. Additionally the patient has not had any episodes or concerns of chest discom  Past Medical History:  Diagnosis Date  . Anginal pain ([place])   . BPH (benign prostatic hyperplasia)   . Cancer ([place])    skin cancer   . Chronic kidney disease   . GERD (gastroesophageal reflux  disease)   . History of hiatal hernia   . Hypercholesterolemia   . Hypertension       Surgical History:  Past Surgical History:  Procedure Laterality Date  . IR GENERIC HISTORICAL  09/29/2015   IR NEPHROSTOMY PLACEMENT LEFT 09/29/2015 [name], MD [facility]  . IR GENERIC HISTORICAL  10/20/2015   IR NEPHROSTOGRAM LEFT THRU EXISTING ACCESS 10/20/2015 [facility]  . JOINT REPLACEMENT    . LAPAROSCOPIC NEPHRECTOMY, HAND ASSISTED Left 11/07/2015   Procedure: HAND ASSISTED LAPAROSCOPIC NEPHRECTOMY;  Surgeon: Hollice Espy, MD;  Location: [facility] ORS;  Service: Urology;  Laterality: Left;  . LEFT HEART CATH AND CORONARY ANGIOGRAPHY N/A 06/28/2016   Procedure: Left Heart Cath and Coronary Angiography;  Surgeon: Yolonda Kida, MD;  Location: [facility];  Service: Cardiovascular;  Laterality: N/A;  . PROSTATE ABLATION N/A 04/10/2016   Procedure: PROSTATE ABLATION;  Surgeon: Hollice Espy, MD;  Location: [facility] ORS;  Service: Urology;  Laterality: N/A;  . REPLACEMENT TOTAL KNEE Right   . THULIUM LASER TURP (TRANSURETHRAL RESECTION OF PROSTATE) N/A 04/10/2016   Procedure: THULIUM LASER TURP (TRANSURETHRAL RESECTION OF PROSTATE);  Surgeon: Hollice Espy, MD;  Location: [facility] ORS;  Service: Urology;  Laterality: N/A;  . TONSILLECTOMY       Home Meds: Prior to Admission medications   Medication Sig Start Date End Date Taking? Authorizing Provider  amiodarone (PACERONE) 200 MG tablet Take 100 mg by mouth 2 (two) times daily.  08/05/16 08/05/17 Yes [provider]  apixaban (ELIQUIS) 5 MG TABS tablet Take 5 mg by mouth 2 (two) times daily.  08/07/16  Yes  [provider]  aspirin EC 81 MG tablet Take 81 mg by mouth daily.   Yes [provider]  atorvastatin (LIPITOR) 80 MG tablet Take 80 mg by mouth at bedtime.  08/05/16 08/05/17 Yes [provider]  cetirizine (ZYRTEC) 10 MG tablet Take 10 mg by mouth daily.   Yes [provider]  ferrous  sulfate 325 (65 FE) MG tablet Take 325 mg by mouth daily with breakfast.   Yes [provider]  finasteride (PROSCAR) 5 MG tablet Take 5 mg by mouth daily.  08/05/16  Yes [provider]  FLUoxetine (PROZAC) 20 MG capsule Take 20 mg by mouth daily.  08/05/16 08/05/17 Yes [provider]  fluticasone (FLONASE) 50 MCG/ACT nasal spray Place 1 spray into both nostrils 2 (two) times daily.    Yes [provider]  gabapentin (NEURONTIN) 100 MG capsule Take 100 mg by mouth 2 (two) times daily as needed (pain).  08/07/16 08/07/17 Yes [provider]  metoprolol tartrate (LOPRESSOR) 25 MG tablet Take 12.5 mg by mouth 2 (two) times daily.   Yes [provider]  Multiple Vitamin (MULTIVITAMIN) tablet Take 1 tablet by mouth daily.   Yes [provider]  omeprazole (PRILOSEC) 40 MG capsule Take 40 mg by mouth 2 (two) times daily.   Yes [provider]  tamsulosin (FLOMAX) 0.4 MG CAPS capsule Take 0.4 mg by mouth daily.   Yes [provider]  traMADol (ULTRAM) 50 MG tablet Take 50 mg by mouth every 8 (eight) hours as needed for moderate pain.  11/17/15  Yes [provider]  traZODone (DESYREL) 50 MG tablet Take 50 mg by mouth at bedtime.   Yes [provider]  vitamin E 400 UNIT capsule Take 400 Units by mouth daily.    Yes [provider]    Inpatient Medications:  . amiodarone  100 mg Oral BID  . apixaban  5 mg Oral BID  . aspirin EC  81 mg Oral Daily  . atorvastatin  80 mg Oral QHS  . docusate sodium  100 mg Oral BID  . ferrous sulfate  325 mg Oral Q breakfast  . finasteride  5 mg Oral Daily  . FLUoxetine  20 mg Oral Daily  . fluticasone  1 spray Each Nare BID  . loratadine  10 mg Oral Daily  . metoprolol tartrate  12.5 mg Oral BID  . multivitamin with minerals  1 tablet Oral Daily  . pantoprazole  40 mg Oral BID AC  . tamsulosin  0.4 mg Oral Daily  . traZODone  50 mg Oral QHS  . vitamin E  400  Units Oral Daily   . sodium chloride 100 mL/hr at 12/06/16 0546    Allergies:  Allergies  Allergen Reactions  . Peanut Oil Other (See Comments)    Mouth ulcers   . Peanut-Containing Drug Products Other (See Comments)    Mouth ulcers     Social History   Social History  . Marital status: Married    Spouse name: N/A  . Number of children: N/A  . Years of education: N/A   Occupational History  . Not on file.   Social History Main Topics  . Smoking status: Former Smoker    Packs/day: 1.50    Years: 20.00    Quit date: 09/25/1983  . Smokeless tobacco: Never Used  . Alcohol use No  . Drug use: No  . Sexual activity: Not on file   Other Topics Concern  .  Not on file   Social History Narrative  . No narrative on file     Family History  Problem Relation Age of Onset  . Kidney cancer Mother   . Lung cancer Father   . Prostate cancer Neg Hx   . Bladder Cancer Neg Hx      Review of Systems Positive for bleeding Negative for: General:  chills, fever, night sweats or weight changes.  Cardiovascular: PND orthopnea syncope dizziness  Dermatological skin lesions rashes Respiratory: Cough congestion Urologic: Frequent urination urination at night and hematuria Abdominal: negative for nausea, vomiting, diarrhea, bright red blood per rectum, melena, or hematemesis Neurologic: negative for visual changes, and/or hearing changes  All other systems reviewed and are otherwise negative except as noted above.  Labs:  Recent Labs  12/06/16 0132  TROPONINI <0.03   Lab Results  Component Value Date   WBC 5.8 12/06/2016   HGB 13.2 12/06/2016   HCT 38.8 (L) 12/06/2016   MCV 92.1 12/06/2016   PLT 164 12/06/2016    Recent Labs Lab 12/06/16 0132  NA 141  K 4.3  CL 107  CO2 27  BUN 24*  CREATININE 1.25*  CALCIUM 8.9  PROT 6.6  BILITOT 0.4  ALKPHOS 79  ALT 20  AST 20  GLUCOSE 125*   No results found for: CHOL, HDL, LDLCALC, TRIG No results found for:  DDIMER  Radiology/Studies:  Dg Chest Port 1 View  Result Date: 12/06/2016 CLINICAL DATA:  Weakness and hypoxia. Bleeding from the mouth. Head teeth pulled last Wednesday. EXAM: PORTABLE CHEST 1 VIEW COMPARISON:  09/29/2015 FINDINGS: Postoperative changes in the mediastinum. Cardiac enlargement. No vascular congestion or edema. No focal consolidation. No blunting of costophrenic angles. No pneumothorax. Tortuous aorta. Degenerative changes in the shoulders. Old fracture deformity of the right clavicle. IMPRESSION: Cardiac enlargement.  No evidence of active pulmonary disease. Electronically Signed   By: Lucienne Capers M.D.   On: 12/06/2016 02:31    EKG: inus bradycardia  Weights: Filed Weights   12/06/16 0117 12/06/16 0456  Weight: 106.1 kg (234 lb) 104.2 kg (229 lb 11.2 oz)     Physical Exam: Blood pressure (!) 144/96, pulse (!) 58, temperature (!) 97.5 F (36.4 C), temperature source Oral, resp. rate 17, height 5\' 11"  (1.803 m), weight 104.2 kg (229 lb 11.2 oz), SpO2 96 %. Body mass index is 32.04 kg/m. General: Well developed, well nourished, in no acute distress. Head eyes ears nose throat: Normocephalic, atraumatic, sclera non-icteric, no xanthomas, nares are without discharge. No apparent thyromegaly and/or mass  Lungs: Normal respiratory effort.  no wheezes, no rales, no rhonchi.  Heart: RRR with normal S1 S2. no murmur gallop, no rub, PMI is normal size and placement, carotid upstroke normal without bruit, jugular venous pressure is normal Abdomen: Soft, non-tender, non-distended with normoactive bowel sounds. No hepatomegaly. No rebound/guarding. No obvious abdominal masses. Abdominal aorta is normal size without bruit Extremities: No edema. no cyanosis, no clubbing, no ulcers  Peripheral : 2+ bilateral upper extremity pulses, 2+ bilateral femoral pulses, 2+ bilateral dorsal pedal pulse Neuro: Alert and oriented. No facial asymmetry. No focal deficit. Moves all extremities  spontaneously. Musculoskeletal: Normal muscle tone without kyphosis Psych:  Responds to questions appropriately with a normal affect.    Assessment: 38-year-old male with coronary artery disease status post coronary bypass graft embolic stroke from paroxysmal nonvalvular atrial fibrillation with essential hypertension makes hyperlipidemia and episode of bradycardia as well as mouth bleedingdue to recent teeth pole without evidence of  myocardial infarction and or congestive heart failure  Plan: 1. Hold elequis at this time due to concerns of significant amount of bleeding until bleeding completely stops due to maintenance of normal rhythm at this time and no current concerns of atrial fibrillation 2. Hold metoprololignificant braycardia althoworsening heart rate and severe bradycardia was likely secondary to vagal response 3. Continue high intensity cholesterol therapy 4.Hypertension control with ACE inhibitor as necessary . No further cardiac diagnostics necessary at this time  Signed, Corey Skains M.D. [facility] 12/06/2016, 8:37 AM

## 2016-12-06 NOTE — Progress Notes (Addendum)
Called to room by wife. Pt bleeding from mouth. MD called. In to see pt. MD placed surgicel in place with gauze. Orders placed. Will continue to monitor and assess.

## 2016-12-06 NOTE — ED Notes (Signed)
5 teeth removed under general anesthesia on 10/9. Did not stop Elaquist for surgery

## 2016-12-07 NOTE — Progress Notes (Signed)
Bleeding stop her from the mouth, stop  eliquis.Patient is stable for discharge today, stop her liquids for 5 days, resume after 5 days,, amiodarone, but hold beta blockers because of bradycardia. Discharge instruction are done, seen by cardiology,follow-up with Dr. Nehemiah Massed in 1 week.  Time;25 min

## 2016-12-07 NOTE — Plan of Care (Signed)
Problem: Pain Managment: Goal: General experience of comfort will improve Outcome: Not Progressing Patient has been complaining of a headache without any relief from pain medication. Notified MD about condition to address the pain.

## 2016-12-07 NOTE — Progress Notes (Signed)
[facility] Encounter Note  Patient: Craig [name] / Admit Date: 12/06/2016 / Date of Encounter: 12/07/2016, 6:57 AM   Subjective: Bleeding has stopped from tooth extraction and patient is restingmfortably. Patient has no evidence of significant bradycardia anymore after discontinuation of metoprolol. Patient remains in normal sinus rhythmwithout evidence of myocardial infarction or angina or heart failure  Review of Systems: Positive WUJ:[id number] sinus rhythm Negative for: Vision change, hearing change, syncope, dizziness, nausea, vomiting,diarrhea, bloody stool, stomach pain, cough, congestion, diaphoresis, urinary frequency, urinary pain,skin lesions, skin rashes Others previously listed  Objective: Telemetry: normal sinus rhythm Physical Exam: Blood pressure 117/76, pulse 63, temperature 97.7 F (36.5 C), temperature source Oral, resp. rate 17, height 5\' 11"  (1.803 m), weight 103.7 kg (228 lb 11.2 oz), SpO2 95 %. Body mass index is 31.9 kg/m. General: Well developed, well nourished, in no acute distress. Head: Normocephalic, atraumatic, sclera non-icteric, no xanthomas, nares are without discharge. Neck: No apparent masses Lungs: Normal respirations with no wheezes, no rhonchi, no rales , no crackles   Heart: Regular rate and rhythm, normal S1 S2, no murmur, no rub, no gallop, PMI is normal size and placement, carotid upstroke normal without bruit, jugular venous pressure normal Abdomen: Soft, non-tender, non-distended with normoactive bowel sounds. No hepatosplenomegaly. Abdominal aorta is normal size without bruit Extremities: No edema, no clubbing, no cyanosis, no ulcers,  Peripheral: 2+ radial, 2+ femoral, 2+ dorsal pedal pulses Neuro: Alert and oriented. Moves all extremities spontaneously. Psych:  Responds to questions appropriately with a normal affect.   Intake/Output Summary (Last 24 hours) at 12/07/16 0657 Last data filed at 12/07/16 0532  Gross  per 24 hour  Intake          1873.33 ml  Output              400 ml  Net          1473.33 ml    Inpatient Medications:  . amiodarone  100 mg Oral BID  . apixaban  5 mg Oral BID  . aspirin EC  81 mg Oral Daily  . atorvastatin  80 mg Oral QHS  . docusate sodium  100 mg Oral BID  . ferrous sulfate  325 mg Oral Q breakfast  . finasteride  5 mg Oral Daily  . FLUoxetine  20 mg Oral Daily  . fluticasone  1 spray Each Nare BID  . loratadine  10 mg Oral Daily  . multivitamin with minerals  1 tablet Oral Daily  . pantoprazole  40 mg Oral BID AC  . tamsulosin  0.4 mg Oral Daily  . traZODone  50 mg Oral QHS  . vitamin E  400 Units Oral Daily   Infusions:  . sodium chloride 100 mL/hr at 12/07/16 0026    Labs:  Recent Labs  12/06/16 0132  NA 141  K 4.3  CL 107  CO2 27  GLUCOSE 125*  BUN 24*  CREATININE 1.25*  CALCIUM 8.9    Recent Labs  12/06/16 0132  AST 20  ALT 20  ALKPHOS 79  BILITOT 0.4  PROT 6.6  ALBUMIN 3.7    Recent Labs  12/06/16 0132  WBC 5.8  NEUTROABS 3.2  HGB 13.2  HCT 38.8*  MCV 92.1  PLT 164    Recent Labs  12/06/16 0132  TROPONINI <0.03   Invalid input(s): POCBNP No results for input(s): HGBA1C in the last 72 hours.   Weights: Filed Weights   12/06/16 0117 12/06/16 0456 12/07/16 0505  Weight: 106.1 kg (234 lb) 104.2 kg (229 lb 11.2 oz) 103.7 kg (228 lb 11.2 oz)     Radiology/Studies:  Dg Chest Port 1 View  Result Date: 12/06/2016 CLINICAL DATA:  Weakness and hypoxia. Bleeding from the mouth. Head teeth pulled last Wednesday. EXAM: PORTABLE CHEST 1 VIEW COMPARISON:  09/29/2015 FINDINGS: Postoperative changes in the mediastinum. Cardiac enlargement. No vascular congestion or edema. No focal consolidation. No blunting of costophrenic angles. No pneumothorax. Tortuous aorta. Degenerative changes in the shoulders. Old fracture deformity of the right clavicle. IMPRESSION: Cardiac enlargement.  No evidence of active pulmonary disease.  Electronically Signed   By: Lucienne Capers M.D.   On: 12/06/2016 02:31     Assessment and Recommendation  71 y.o. male with the known coronary artery disease status post. Coronary artery bypass graft in stroke with paroxysmal nonvalvular atrial fibrillation having significant mouth bleeding and no current evidence of the angina congestive heart failure or acute coronary syndrome. 1. Abstain from elequis for 3-5 days due to concerns of bleeding complications from mouth surgery 2. Continue amiodarone at current dose for heart rate control and maintenance of normal 3. Abstain from beta blocker due to bradycardia 4. No further cardiac diagnostics necessary at this time 5. Possible reinstatement of aspirin if necessary after aboveX line 6. Begin ambulation and possible discharged home with follow-up next week  Signed, Serafina Royals M.D. FACC

## 2016-12-07 NOTE — Progress Notes (Signed)
Pt discharged home with wife via wheelchair, VSS no complaints at this time. Education given, will follow up in 1 week with cardiologist.

## 2016-12-08 NOTE — Discharge Summary (Signed)
Craig [name], is a 71 y.o. male  DOB February 17, 1946  MRN [id number].  Admission date:  12/06/2016  Admitting Physician  Harrie Foreman, MD  Discharge Date:  12/07/2016   Primary MD  Tracie Harrier, MD  Recommendations for primary care physician for things to follow:   follow with PCP in one week follow-up with PCP in one week Follow-up with cardiology Dr. Nehemiah Massed in 1 week   Admission Diagnosis  Oral bleeding [K13.79] Weakness [R53.1] Symptomatic sinus bradycardia [R00.1] Symptomatic bradycardia [R00.1]   Discharge Diagnosis  Oral bleeding [K13.79] Weakness [R53.1] Symptomatic sinus bradycardia [R00.1] Symptomatic bradycardia [R00.1]    Active Problems:   Symptomatic bradycardia      Past Medical History:  Diagnosis Date  . Anginal pain ([place])   . BPH (benign prostatic hyperplasia)   . CAD (coronary artery disease)   . Cancer ([place])    skin cancer   . Chronic kidney disease   . GERD (gastroesophageal reflux disease)   . History of hiatal hernia   . Hypercholesterolemia   . Hypertension     Past Surgical History:  Procedure Laterality Date  . CORONARY ARTERY BYPASS GRAFT  2018  . IR GENERIC HISTORICAL  09/29/2015   IR NEPHROSTOMY PLACEMENT LEFT 09/29/2015 [name], MD [facility]  . IR GENERIC HISTORICAL  10/20/2015   IR NEPHROSTOGRAM LEFT THRU EXISTING ACCESS 10/20/2015 [facility]  . JOINT REPLACEMENT    . LAPAROSCOPIC NEPHRECTOMY, HAND ASSISTED Left 11/07/2015   Procedure: HAND ASSISTED LAPAROSCOPIC NEPHRECTOMY;  Surgeon: Hollice Espy, MD;  Location: [facility];  Service: Urology;  Laterality: Left;  . LEFT HEART CATH AND CORONARY ANGIOGRAPHY N/A 06/28/2016   Procedure: Left Heart Cath and Coronary Angiography;  Surgeon: Yolonda Kida, MD;  Location: [place] [facility];  Service:  Cardiovascular;  Laterality: N/A;  . PROSTATE ABLATION N/A 04/10/2016   Procedure: PROSTATE ABLATION;  Surgeon: Hollice Espy, MD;  Location: [facility];  Service: Urology;  Laterality: N/A;  . REPLACEMENT TOTAL KNEE Right   . THULIUM LASER TURP (TRANSURETHRAL RESECTION OF PROSTATE) N/A 04/10/2016   Procedure: THULIUM LASER TURP (TRANSURETHRAL RESECTION OF PROSTATE);  Surgeon: Hollice Espy, MD;  Location: [facility];  Service: Urology;  Laterality: N/A;  . TONSILLECTOMY         History of present illness and  Hospital Course:     Kindly see H&P for history of present illness and admission details, please review complete Labs, Consult reports and Test reports for all details in brief  HPI  from the history and physical done on the day of admission 71 year old male patient admitted on 19th of October because of bleeding from the mouth. Patient underwent oral surgery 10 days ago before he came to hospital with marked bleeding. Patient had difficulty achieving hemostasis postoperatively but was fine until 78 th oct  whenhe started to have bleeding after dinner and then needing continued, patient also found to have bradycardia with heart rate 20s and he came.   Hospital Course  #1 severe bradycardia likely secondary to break beta blockers: Stop the metoprolol that he was taking, admitted to telemetry. Seen by cardiology, not have any further evidence of bradycardia after discontinuation of metoprolol, patient remained in sinus rhythm without evidence of MI or angina. Heart rate improved to 63. Cardiologist recommended to start beta blockers at discharge, we will follow the cardiology as an outpatient. #2 proximal atrial fibrillation: Bradycardia resolved, patient to remain in sinus rhythm. Eliquisis stopped because ofmouth bleeding  that happened.he had 4 teeth extracted.no further mouth bleeidng after he is admitted to hospital, cardiologist recommended to stop Eliquis,, aspirin, at least 3-5 days,  same is discussed with patient.  #3. proximal atrial fibrillation patient is on amiodarone at 100 mg twice a day, advised to continue that.  For depression; continue home medication including fluoxetine. #5. BPH'on finasteride. Discharge Condition: stable   Follow UP      Discharge Instructions  and  Discharge Medications   Stop Eliquis and ASA for 5 days   Allergies as of 12/07/2016      Reactions   Peanut Oil Other (See Comments)   Mouth ulcers   Peanut-containing Drug Products Other (See Comments)   Mouth ulcers       Medication List    STOP taking these medications   apixaban 5 MG Tabs tablet Commonly known as:  ELIQUIS   metoprolol tartrate 25 MG tablet Commonly known as:  LOPRESSOR   vitamin E 400 UNIT capsule     TAKE these medications   amiodarone 200 MG tablet Commonly known as:  PACERONE Take 100 mg by mouth 2 (two) times daily.   aspirin EC 81 MG tablet Take 81 mg by mouth daily.   atorvastatin 80 MG tablet Commonly known as:  LIPITOR Take 80 mg by mouth at bedtime.   cetirizine 10 MG tablet Commonly known as:  ZYRTEC Take 10 mg by mouth daily.   ferrous sulfate 325 (65 FE) MG tablet Take 325 mg by mouth daily with breakfast.   finasteride 5 MG tablet Commonly known as:  PROSCAR Take 5 mg by mouth daily.   FLUoxetine 20 MG capsule Commonly known as:  PROZAC Take 20 mg by mouth daily.   fluticasone 50 MCG/ACT nasal spray Commonly known as:  FLONASE Place 1 spray into both nostrils 2 (two) times daily.   gabapentin 100 MG capsule Commonly known as:  NEURONTIN Take 100 mg by mouth 2 (two) times daily as needed (pain).   multivitamin tablet Take 1 tablet by mouth daily.   omeprazole 40 MG capsule Commonly known as:  PRILOSEC Take 40 mg by mouth 2 (two) times daily.   tamsulosin 0.4 MG Caps capsule Commonly known as:  FLOMAX Take 0.4 mg by mouth daily.   traMADol 50 MG tablet Commonly known as:  ULTRAM Take 50 mg by mouth  every 8 (eight) hours as needed for moderate pain.   traZODone 50 MG tablet Commonly known as:  DESYREL Take 50 mg by mouth at bedtime.         Diet and Activity recommendation: See Discharge Instructions above   Consults obtained - cardiology   Major procedures and Radiology Reports - PLEASE review detailed and final reports for all details, in brief -      Dg Chest Port 1 View  Result Date: 12/06/2016 CLINICAL DATA:  Weakness and hypoxia. Bleeding from the mouth. Head teeth pulled last Wednesday. EXAM: PORTABLE CHEST 1 VIEW COMPARISON:  09/29/2015 FINDINGS: Postoperative changes in the mediastinum. Cardiac enlargement. No vascular congestion or edema. No focal consolidation. No blunting of costophrenic angles. No pneumothorax. Tortuous aorta. Degenerative changes in the shoulders. Old fracture deformity of the right clavicle. IMPRESSION: Cardiac enlargement.  No evidence of active pulmonary disease. Electronically Signed   By: Lucienne Capers M.D.   On: 12/06/2016 02:31    Micro Results     No results found for this or any previous visit (from the past 240 hour(s)).  Today   Subjective:   Craig [name] today has no headache,no chest abdominal pain,no new weakness tingling or numbness, feels much better wants to go home today.   Objective:   Blood pressure 137/81, pulse 61, temperature 98 F (36.7 C), temperature source Oral, resp. rate 16, height 5\' 11"  (1.803 m), weight 103.7 kg (228 lb 11.2 oz), SpO2 98 %.  No intake or output data in the 24 hours ending 12/08/16 1406  Exam Awake Alert, Oriented x 3, No new F.N deficits, Normal affect [place].AT,PERRAL Supple Neck,No JVD, No cervical lymphadenopathy appriciated.  Symmetrical Chest wall movement, Good air movement bilaterally, CTAB RRR,No Gallops,Rubs or new Murmurs, No Parasternal Heave +ve B.Sounds, Abd Soft, Non tender, No organomegaly appriciated, No rebound -guarding or rigidity. No Cyanosis, Clubbing  or edema, No new Rash or bruise  Data Review   CBC w Diff:  Lab Results  Component Value Date   WBC 5.8 12/06/2016   HGB 13.2 12/06/2016   HGB 11.6 (L) 05/18/2014   HCT 38.8 (L) 12/06/2016   HCT 42.3 02/10/2012   PLT 164 12/06/2016   PLT 125 (L) 05/18/2014   LYMPHOPCT 30 12/06/2016   MONOPCT 11 12/06/2016   EOSPCT 4 12/06/2016   BASOPCT 0 12/06/2016    CMP:  Lab Results  Component Value Date   NA 141 12/06/2016   NA 142 12/06/2015   NA 135 05/18/2014   K 4.3 12/06/2016   K 4.0 05/18/2014   CL 107 12/06/2016   CL 101 05/18/2014   CO2 27 12/06/2016   CO2 28 05/18/2014   BUN 24 (H) 12/06/2016   BUN 19 12/06/2015   BUN 14 05/18/2014   CREATININE 1.25 (H) 12/06/2016   CREATININE 0.96 05/18/2014   PROT 6.6 12/06/2016   PROT 6.8 02/10/2012   ALBUMIN 3.7 12/06/2016   ALBUMIN 3.7 02/10/2012   BILITOT 0.4 12/06/2016   BILITOT 0.6 02/10/2012   ALKPHOS 79 12/06/2016   ALKPHOS 78 02/10/2012   AST 20 12/06/2016   AST 19 02/10/2012   ALT 20 12/06/2016   ALT 32 02/10/2012  .   Total Time in preparing paper work, data evaluation and todays exam - 85 minutes  Marry Kusch M.D on 12/07/2016 at 2:06 PM    Note: This dictation was prepared with Dragon dictation along with smaller phrase technology. Any transcriptional errors that result from this process are unintentional.

## 2016-12-10 DIAGNOSIS — Z48812 Encounter for surgical aftercare following surgery on the circulatory system: Secondary | ICD-10-CM | POA: Diagnosis not present

## 2016-12-10 DIAGNOSIS — Z951 Presence of aortocoronary bypass graft: Secondary | ICD-10-CM

## 2016-12-10 NOTE — Progress Notes (Signed)
Daily Session Note  Patient Details  Name: Craig [name] MRN: [id number] Date of Birth: August 18, 1945 Referring Provider:     Cardiac Rehab from 09/12/2016 in [facility]  Referring Provider  [facility]      Encounter Date: 12/10/2016  Check In:     Session Check In - 12/10/16 0835      Check-In   Location [facility]   Staff Present Heath Lark, RN, BSN, CCRP;Jessica Luan Pulling, MA, ACSM RCEP, Exercise Physiologist;Zaccheus Edmister Oletta Darter, [place], ACSM CEP, Exercise Physiologist   Supervising physician immediately available to respond to emergencies See telemetry face sheet for immediately available ER MD   Medication changes reported     No   Fall or balance concerns reported    No   Warm-up and Cool-down Performed on first and last piece of equipment   Resistance Training Performed Yes   VAD Patient? No     VAD patient   Has back up controller? No         History  Smoking Status  . Former Smoker  . Packs/day: 1.50  . Years: 20.00  . Quit date: 09/25/1983  Smokeless Tobacco  . Never Used    Goals Met:  Independence with exercise equipment Exercise tolerated well No report of cardiac concerns or symptoms Strength training completed today  Goals Unmet:  Not Applicable  Comments: Pt able to follow exercise prescription today without complaint.  Will continue to monitor for progression.    Dr. Emily Filbert is Medical Director for [facility].

## 2016-12-12 ENCOUNTER — Encounter: Payer: Medicare Other | Admitting: *Deleted

## 2016-12-12 DIAGNOSIS — Z951 Presence of aortocoronary bypass graft: Secondary | ICD-10-CM

## 2016-12-12 DIAGNOSIS — Z48812 Encounter for surgical aftercare following surgery on the circulatory system: Secondary | ICD-10-CM | POA: Diagnosis not present

## 2016-12-12 NOTE — Progress Notes (Signed)
Cardiac Individual Treatment Plan  Patient Details  Name: Craig [name] MRN: [id number] Date of Birth: 02-10-1946 Referring Provider:     Cardiac Rehab from 09/12/2016 in [facility] Cardiac and Pulmonary Rehab  Referring Provider  [facility]      Initial Encounter Date:    Cardiac Rehab from 09/12/2016 in [facility] Cardiac and Pulmonary Rehab  Date  09/12/16  Referring Provider  [facility]      Visit Diagnosis: S/P CABG x 3  Patient's Home Medications on Admission:  Current Outpatient Prescriptions:  .  amiodarone (PACERONE) 200 MG tablet, Take 100 mg by mouth 2 (two) times daily. , Disp: , Rfl:  .  aspirin EC 81 MG tablet, Take 81 mg by mouth daily., Disp: , Rfl:  .  atorvastatin (LIPITOR) 80 MG tablet, Take 80 mg by mouth at bedtime. , Disp: , Rfl:  .  cetirizine (ZYRTEC) 10 MG tablet, Take 10 mg by mouth daily., Disp: , Rfl:  .  ferrous sulfate 325 (65 FE) MG tablet, Take 325 mg by mouth daily with breakfast., Disp: , Rfl:  .  finasteride (PROSCAR) 5 MG tablet, Take 5 mg by mouth daily. , Disp: , Rfl:  .  FLUoxetine (PROZAC) 20 MG capsule, Take 20 mg by mouth daily. , Disp: , Rfl:  .  fluticasone (FLONASE) 50 MCG/ACT nasal spray, Place 1 spray into both nostrils 2 (two) times daily. , Disp: , Rfl: 3 .  gabapentin (NEURONTIN) 100 MG capsule, Take 100 mg by mouth 2 (two) times daily as needed (pain). , Disp: , Rfl:  .  Multiple Vitamin (MULTIVITAMIN) tablet, Take 1 tablet by mouth daily., Disp: , Rfl:  .  omeprazole (PRILOSEC) 40 MG capsule, Take 40 mg by mouth 2 (two) times daily., Disp: , Rfl: 10 .  tamsulosin (FLOMAX) 0.4 MG CAPS capsule, Take 0.4 mg by mouth daily., Disp: , Rfl: 3 .  traMADol (ULTRAM) 50 MG tablet, Take 50 mg by mouth every 8 (eight) hours as needed for moderate pain. , Disp: , Rfl: 0 .  traZODone (DESYREL) 50 MG tablet, Take 50 mg by mouth at bedtime., Disp: , Rfl:   Past Medical History: Past Medical History:  Diagnosis Date  . Anginal pain ([place])   . BPH (benign  prostatic hyperplasia)   . CAD (coronary artery disease)   . Cancer ([place])    skin cancer   . Chronic kidney disease   . GERD (gastroesophageal reflux disease)   . History of hiatal hernia   . Hypercholesterolemia   . Hypertension     Tobacco Use: History  Smoking Status  . Former Smoker  . Packs/day: 1.50  . Years: 20.00  . Quit date: 09/25/1983  Smokeless Tobacco  . Never Used    Labs: Recent Review Flowsheet Data    There is no flowsheet data to display.       Exercise Target Goals:    Exercise Program Goal: Individual exercise prescription set with THRR, safety & activity barriers. Participant demonstrates ability to understand and report RPE using BORG scale, to self-measure pulse accurately, and to acknowledge the importance of the exercise prescription.  Exercise Prescription Goal: Starting with aerobic activity 30 plus minutes a day, 3 days per week for initial exercise prescription. Provide home exercise prescription and guidelines that participant acknowledges understanding prior to discharge.  Activity Barriers & Risk Stratification:     Activity Barriers & Cardiac Risk Stratification - 09/12/16 1354      Activity Barriers & Cardiac Risk Stratification  Activity Barriers Left Knee Replacement  waiting for knee replacement   Cardiac Risk Stratification High      6 Minute Walk:     6 Minute Walk    Row Name 09/12/16 1423 12/05/16 1026       6 Minute Walk   Phase Initial Discharge    Distance 795 feet 1125 feet    Distance % Change  - 41.5 %    Distance Feet Change  - 330 ft    Walk Time 6 minutes 6 minutes    # of Rest Breaks 0 0    MPH 1.5 2.13    METS 1.8 2.19    RPE 13 12    VO2 Peak 6.2 7.7    Symptoms Yes (comment) No    Comments knee pain 4/10 L knee did no use cane    Resting HR 58 bpm 51 bpm    Resting BP 122/84 140/70    Max Ex. HR 94 bpm 85 bpm    Max Ex. BP 148/86 142/80    2 Minute Post BP 134/82  -       Oxygen Initial  Assessment:   Oxygen Re-Evaluation:   Oxygen Discharge (Final Oxygen Re-Evaluation):   Initial Exercise Prescription:     Initial Exercise Prescription - 09/12/16 1400      Date of Initial Exercise RX and Referring Provider   Date 09/12/16   Referring Provider [facility]     Treadmill   MPH 1.5   Grade 0   Minutes 15   METs 1.8     NuStep   Level 2   SPM 80   Minutes 15   METs 1.8     Arm Ergometer   Level 1   RPM 50   Minutes 15   METs 1.5     Prescription Details   Frequency (times per week) 3   Duration Progress to 45 minutes of aerobic exercise without signs/symptoms of physical distress     Intensity   THRR 40-80% of Max Heartrate 94-131   Ratings of Perceived Exertion 11-13   Perceived Dyspnea 0-4     Resistance Training   Training Prescription Yes   Weight 3 lb   Reps 10-15      Perform Capillary Blood Glucose checks as needed.  Exercise Prescription Changes:      Exercise Prescription Changes    Row Name 09/12/16 1400 09/26/16 1500 10/01/16 0900 10/11/16 1400 10/22/16 1600     Response to Exercise   Blood Pressure (Admit) 122/84 144/82  - 134/82 124/74   Blood Pressure (Exercise) 148/86 132/64  - 138/76 144/76   Blood Pressure (Exit) 134/82 112/62  - 124/72 126/74   Heart Rate (Admit) 58 bpm 53 bpm  - 54 bpm 87 bpm   Heart Rate (Exercise) 94 bpm 68 bpm  - 80 bpm 95 bpm   Heart Rate (Exit) 55 bpm 52 bpm  - 58 bpm 59 bpm   Oxygen Saturation (Admit) 97 %  -  -  -  -   Oxygen Saturation (Exercise) 98 %  -  -  -  -   Rating of Perceived Exertion (Exercise) 13 14  - 12 13   Symptoms  - pain in hips on treadmill  - pain in hips and knee on treadmill pain in hips and knee on treadmill   Duration  - Continue with 45 min of aerobic exercise without signs/symptoms of physical distress.  - Continue with 45 min  of aerobic exercise without signs/symptoms of physical distress. Continue with 45 min of aerobic exercise without signs/symptoms of physical  distress.   Intensity  - THRR unchanged  - THRR unchanged THRR unchanged     Progression   Progression  - Continue to progress workloads to maintain intensity without signs/symptoms of physical distress.  - Continue to progress workloads to maintain intensity without signs/symptoms of physical distress. Continue to progress workloads to maintain intensity without signs/symptoms of physical distress.   Average METs  - 1.66  - 1.86 1.79     Resistance Training   Training Prescription  - Yes  - Yes Yes   Weight  - 3 lb  - 3 lb 3 lb   Reps  - 10-15  - 10-15 10-15     Interval Training   Interval Training  - No  - No No     Treadmill   MPH  - 1  - 1 1   Grade  -  -  - 0 0   Minutes  - 15  - 15 15   METs  - 1.77  - 1.77 1.77     NuStep   Level  - 1  - 1 1   Minutes  - 15  - 15 15   METs  - 1.7  - 2.2 2     Arm Ergometer   Level  - 1  - 1 1   Minutes  - 15  - 15 15   METs  - 1.5  - 1.6 1.6     Home Exercise Plan   Plans to continue exercise at  -  - Home (comment)  walking and chair exercises Home (comment)  walking and chair exercises Home (comment)  walking and chair exercises   Frequency  -  - Add 2 additional days to program exercise sessions. Add 2 additional days to program exercise sessions. Add 2 additional days to program exercise sessions.   Initial Home Exercises Provided  -  - 10/01/16 10/01/16 10/01/16   Row Name 11/05/16 1400 11/22/16 1000 12/04/16 1200         Response to Exercise   Blood Pressure (Admit) 122/84 128/76 144/78     Blood Pressure (Exercise) 140/82 142/70 134/67     Blood Pressure (Exit) 146/82 134/70 126/64     Heart Rate (Admit) 63 bpm 67 bpm 67 bpm     Heart Rate (Exercise) 115 bpm 91 bpm 70 bpm     Heart Rate (Exit) 56 bpm 58 bpm 60 bpm     Rating of Perceived Exertion (Exercise) _0 Symptoms pain in hips and knee on treadmill pain in hips and knee on treadmill none     Duration Continue with 45 min of aerobic exercise without  signs/symptoms of physical distress. Continue with 45 min of aerobic exercise without signs/symptoms of physical distress. Continue with 45 min of aerobic exercise without signs/symptoms of physical distress.     Intensity THRR unchanged THRR unchanged THRR unchanged       Progression   Progression Continue to progress workloads to maintain intensity without signs/symptoms of physical distress. Continue to progress workloads to maintain intensity without signs/symptoms of physical distress. Continue to progress workloads to maintain intensity without signs/symptoms of physical distress.     Average METs 2.02 2.07 2.04       Resistance Training   Training Prescription Yes Yes Yes     Weight 3 lb  3 lb 3 lb     Reps 10-15 10-15 10-15       Interval Training   Interval Training No No No       Treadmill   MPH 1 1.3 1.3     Grade 0 0 0     Minutes _0 METs 1.77 2 2       NuStep   Level _1 Minutes _2 METs 2.6 2.5 2.3       Arm Ergometer   Level 1 1  -     Minutes 15 15  -     METs 1.7 1.7  -       Home Exercise Plan   Plans to continue exercise at Home (comment)  walking and chair exercises Home (comment)  walking and chair exercises Home (comment)  walking and chair exercises     Frequency Add 2 additional days to program exercise sessions. Add 2 additional days to program exercise sessions. Add 2 additional days to program exercise sessions.     Initial Home Exercises Provided 10/01/16 10/01/16 10/01/16        Exercise Comments:      Exercise Comments    Row Name 09/17/16 0950 12/12/16 0926         Exercise Comments First full day of exercise!  Patient was oriented to gym and equipment including functions, settings, policies, and procedures.  Patient's individual exercise prescription and treatment plan were reviewed.  All starting workloads were established based on the results of the 6 minute walk test done at initial orientation visit.  The  plan for exercise progression was also introduced and progression will be customized based on patient's performance and goals. [name] graduated today from cardiac rehab with 34 sessions completed.  Details of the patient's exercise prescription and what He needs to do in order to continue the prescription and progress were discussed with patient.  Patient was given a copy of prescription and goals.  Patient verbalized understanding.  [name] plans to continue to exercise by doing chair exercises at home.         Exercise Goals and Review:      Exercise Goals    Row Name 09/12/16 1423             Exercise Goals   Increase Physical Activity Yes       Intervention Provide advice, education, support and counseling about physical activity/exercise needs.;Develop an individualized exercise prescription for aerobic and resistive training based on initial evaluation findings, risk stratification, comorbidities and participant's personal goals.       Expected Outcomes Achievement of increased cardiorespiratory fitness and enhanced flexibility, muscular endurance and strength shown through measurements of functional capacity and personal statement of participant.       Increase Strength and Stamina Yes       Intervention Provide advice, education, support and counseling about physical activity/exercise needs.;Develop an individualized exercise prescription for aerobic and resistive training based on initial evaluation findings, risk stratification, comorbidities and participant's personal goals.       Expected Outcomes Achievement of increased cardiorespiratory fitness and enhanced flexibility, muscular endurance and strength shown through measurements of functional capacity and personal statement of participant.          Exercise Goals Re-Evaluation :     Exercise Goals Re-Evaluation    Row Name 09/26/16 1556 10/01/16 0948 10/11/16 1416 10/22/16 1634 11/05/16  1410     Exercise Goal Re-Evaluation    Exercise Goals Review Increase Physical Activity;Increase Strenth and Stamina Increase Physical Activity;Increase Strenth and Stamina Increase Physical Activity;Increase Strenth and Stamina Increase Physical Activity;Increase Strength and Stamina Increase Physical Activity;Increase Strength and Stamina   Comments Michele Mcalpine is off to a good start in rehab.  He has had 3 full days of exercise.  He still struggles on the treadmill with his hips.  He wanted to stay on the treadmill versus walking on the track.  We will continue to work with him on his progression.   Reviewed home exercise with pt today.  Pt plans to do chair exercises at home for exercise.  His walking ability is limited by his knee. Reviewed THR, pulse, RPE, sign and symptoms, NTG use, and when to call 911 or MD.  Also discussed weather considerations and indoor options.  Pt voiced understanding. Michele Mcalpine has been doing well in rehab.  He is now walking for the full 15 min without stopping, but it does continue to bother his hip and knee.  He does not walk home for this reason.  We will continue to work on his progression. Michele Mcalpine continues to do well in rehab.  He does rest his knee on the NuStep from time to time. We will start to increase his workloads.  We will continue to monitor his progression.  Michele Mcalpine has been doing well in rehab.  He is up to 2.6 METs on the NuStep.  We will continue to monitor his progression.   Expected Outcomes Short: Continue to walk on treadmill.  Long: Improve walking ability.  Short: Add in chair exercises at home.  Long: Exercise regularly. Short: Increase workloads on seated equipment.  Long: Exercise more at home.  Short: Try to increase workloads again.  Long: Exercise more at home.  Short: Increase arm crank workload.  Long: Continue to build up strength and stamina.   Row Name 11/14/16 0830 11/22/16 1030 12/04/16 1204         Exercise Goal Re-Evaluation   Exercise Goals Review Increase Physical Activity;Increase  Strength and Stamina;Understanding of Exercise Prescription Increase Physical Activity;Increase Strength and Stamina Increase Physical Activity;Increase Strength and Stamina     Comments Michele Mcalpine is doing pretty good.  He is doing his chair exercsies at home three days a week.  He is also doing the weight exercises as well.  He is getting his strength and stamina are getting better.  He is scheduled to have his knee replaced on 11/28!  He will continue to build strength until then.  Michele Mcalpine has continued to do well in rehab.  He is excited about his knee replacement and hopes it will help him.  He is up to 1.3 mph on the treadmill.  We will continue to monitor his progression.  [name] comtimues to tolerate exercise well.       Expected Outcomes Short: Double weight sets at home since he is using light weight.  Long: Continue to build strength and stamina to prepare for surgery. Short: Continue to work on chair and weight exercises at home.  Long: Continue to build strength for surgery.  Short - Michele Mcalpine will finish HT.  Long - Michele Mcalpine will continue to exercise on his own.        Discharge Exercise Prescription (Final Exercise Prescription Changes):     Exercise Prescription Changes - 12/04/16 1200      Response to Exercise   Blood Pressure (Admit) 144/78   Blood  Pressure (Exercise) 134/67   Blood Pressure (Exit) 126/64   Heart Rate (Admit) 67 bpm   Heart Rate (Exercise) 70 bpm   Heart Rate (Exit) 60 bpm   Rating of Perceived Exertion (Exercise) 13   Symptoms none   Duration Continue with 45 min of aerobic exercise without signs/symptoms of physical distress.   Intensity THRR unchanged     Progression   Progression Continue to progress workloads to maintain intensity without signs/symptoms of physical distress.   Average METs 2.04     Resistance Training   Training Prescription Yes   Weight 3 lb   Reps 10-15     Interval Training   Interval Training No     Treadmill   MPH 1.3   Grade 0   Minutes  15   METs 2     NuStep   Level 1   Minutes 15   METs 2.3     Home Exercise Plan   Plans to continue exercise at Home (comment)  walking and chair exercises   Frequency Add 2 additional days to program exercise sessions.   Initial Home Exercises Provided 10/01/16      Nutrition:  Target Goals: Understanding of nutrition guidelines, daily intake of sodium <158m, cholesterol <2012m calories 30% from fat and 7% or less from saturated fats, daily to have 5 or more servings of fruits and vegetables.  Biometrics:     Pre Biometrics - 09/12/16 1422      Pre Biometrics   Height 5' 10.25" (1.784 m)   Weight 231 lb 9.6 oz (105.1 kg)   Waist Circumference 43.75 inches   Hip Circumference 46 inches   Waist to Hip Ratio 0.95 %   BMI (Calculated) 33.1   Single Leg Stand 13.27 seconds         Post Biometrics - 12/05/16 1025       Post  Biometrics   Height 5' 10.25" (1.784 m)   Weight 234 lb (106.1 kg)   Waist Circumference 43 inches   Hip Circumference 46 inches   Waist to Hip Ratio 0.93 %   BMI (Calculated) 33.35   Single Leg Stand 10.96 seconds      Nutrition Therapy Plan and Nutrition Goals:     Nutrition Therapy & Goals - 11/14/16 0840      Nutrition Therapy   RD appointment defered Yes      Nutrition Discharge: Rate Your Plate Scores:     Nutrition Assessments - 11/19/16 1144      MEDFICTS Scores   Pre Score 58   Post Score 77   Score Difference 19      Nutrition Goals Re-Evaluation:     Nutrition Goals Re-Evaluation    Row Name 10/17/16 1340 11/14/16 0840           Goals   Current Weight  - 234 lb (106.1 kg)      Nutrition Goal  - Cut back salt, heart healthy diet, portion control      Comment [name] states he eats low sodium and is cutting back on ice cream.  Does not want to meet with RD. PhAbbe Amsterdamas reduced his sodium bu 90%!  He limits his sweets.  He needs to work on portion control to help with his weight loss.  He continues to decline a  nutrtion appointment.  He has cut back, but knows he still needs to watch portions.       Expected Outcome Short - PhAbbe Amsterdamill continue healthy  dietary habits.  Long - [name] will maintain weight. Short: Start to focus on portion control.  Long: Continue with heart healthy diet.          Nutrition Goals Discharge (Final Nutrition Goals Re-Evaluation):     Nutrition Goals Re-Evaluation - 11/14/16 0840      Goals   Current Weight 234 lb (106.1 kg)   Nutrition Goal Cut back salt, heart healthy diet, portion control   Comment [name] has reduced his sodium bu 90%!  He limits his sweets.  He needs to work on portion control to help with his weight loss.  He continues to decline a nutrtion appointment.  He has cut back, but knows he still needs to watch portions.    Expected Outcome Short: Start to focus on portion control.  Long: Continue with heart healthy diet.       Psychosocial: Target Goals: Acknowledge presence or absence of significant depression and/or stress, maximize coping skills, provide positive support system. Participant is able to verbalize types and ability to use techniques and skills needed for reducing stress and depression.   Initial Review & Psychosocial Screening:     Initial Psych Review & Screening - 09/12/16 1345      Initial Review   Current issues with None Identified     Family Dynamics   Good Support System? Yes  wife     Barriers   Psychosocial barriers to participate in program There are no identifiable barriers or psychosocial needs.     Screening Interventions   Interventions Encouraged to exercise;Program counselor consult      Quality of Life Scores:      Quality of Life - 11/19/16 1144      Quality of Life Scores   Health/Function Pre 20.79 %   Health/Function Post 23.93 %   Health/Function % Change 15.1 %   Socioeconomic Pre 21 %   Socioeconomic Post 26.57 %   Socioeconomic % Change  26.52 %   Psych/Spiritual Pre 21 %   Psych/Spiritual  Post 28.29 %   Psych/Spiritual % Change 34.71 %   Family Pre 21 %   Family Post 22.8 %   Family % Change 8.57 %   GLOBAL Pre 20.91 %   GLOBAL Post 25.21 %   GLOBAL % Change 20.56 %      PHQ-9: Recent Review Flowsheet Data    Depression screen [facility] 2/9 11/19/2016 09/12/2016   Decreased Interest 1 2   Down, Depressed, Hopeless 0 0   PHQ - 2 Score 1 2   Altered sleeping 0 1   Tired, decreased energy 1 3   Change in appetite 0 0   Feeling bad or failure about yourself  0 0   Trouble concentrating 0 0   Moving slowly or fidgety/restless 0 1   Suicidal thoughts 0 0   PHQ-9 Score 2 7   Difficult doing work/chores Not difficult at all Somewhat difficult     Interpretation of Total Score  Total Score Depression Severity:  1-4 = Minimal depression, 5-9 = Mild depression, 10-14 = Moderate depression, 15-19 = Moderately severe depression, 20-27 = Severe depression   Psychosocial Evaluation and Intervention:     Psychosocial Evaluation - 10/08/16 0944      Psychosocial Evaluation & Interventions   Interventions Encouraged to exercise with the program and follow exercise prescription   Comments Counselor met with Mr. Voiles [name]) today for initial psychosocial evaluation.  He is a 71 year old who had a  CABGx3 and stroke in mid-May.  He has a strong support system with a spouse of 83 years and several adult children and grandchildren who live locally, as well as [name] is actively involved in his local church.  He has several other health issues that impact him at this time with prostate surgery a year ago and the need for knee surgery upon completion of this program.  [name] has a good appetite but reports he has intermittent sleep subsequent to the prostate surgery.  He typically gets about 6 hours/night.  [name] denies a history of depression or anxiety or any current symptoms, and other than his health, he reports minimal stress in his life currently.   He has goals to improve his stamina and  strength while in this program.  He will be followed by staff.     Expected Outcomes [name] will benefit from consistent exercise to achieve his stated goals. The educational and psychoeducational components of this program will be helpful as well.  [name] will be followed by staff.   Continue Psychosocial Services  Follow up required by staff      Psychosocial Re-Evaluation:     Psychosocial Re-Evaluation    [place] Name 10/25/16 1353 11/14/16 0842           Psychosocial Re-Evaluation   Current issues with None Identified Current Stress Concerns;Current Sleep Concerns      Comments NO problmes  [name] is starting to sleep better as he is not getting up as much at night.  He continues to have a good appetite.    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  He is making the best of it and maintaining a postive attitude.   The biggest benefit of coming to rehab has been getting more exercise and helping all over.       Expected Outcomes Short and long term goald Cont to have minimal stress and be aware to report any concerns Short: Continue to attend class and sleep better.  Long: Continue to maintain postive attitude and cope well.      Interventions  - Encouraged to attend Cardiac Rehabilitation for the exercise;Stress management education      Continue Psychosocial Services   - Follow up required by staff        Initial Review   Source of Stress Concerns  - Occupation         Psychosocial Discharge (Final Psychosocial Re-Evaluation):     Psychosocial Re-Evaluation - 11/14/16 0842      Psychosocial Re-Evaluation   Current issues with Current Stress Concerns;Current Sleep Concerns   Comments [name] is starting to sleep better as he is not getting up as much at night.  He continues to have a good appetite.    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  He is making the best of it and maintaining a postive attitude.   The  biggest benefit of coming to rehab has been getting more exercise and helping all over.    Expected Outcomes Short: Continue to attend class and sleep better.  Long: Continue to maintain postive attitude and cope well.   Interventions Encouraged to attend Cardiac Rehabilitation for the exercise;Stress management education   Continue Psychosocial Services  Follow up required by staff     Initial Review   Source of Stress Concerns Occupation      Vocational Rehabilitation: Provide vocational rehab assistance to qualifying candidates.   Vocational Rehab  Evaluation & Intervention:     Vocational Rehab - 09/12/16 1353      Initial Vocational Rehab Evaluation & Intervention   Assessment shows need for Vocational Rehabilitation No      Education: Education Goals: Education classes will be provided on a variety of topics geared toward better understanding of heart health and risk factor modification. Participant will state understanding/return demonstration of topics presented as noted by education test scores.  Learning Barriers/Preferences:     Learning Barriers/Preferences - 09/12/16 1352      Learning Barriers/Preferences   Learning Barriers None   Learning Preferences Verbal Instruction      Education Topics: General Nutrition Guidelines/Fats and Fiber: -Group instruction provided by verbal, written material, models and posters to present the general guidelines for heart healthy nutrition. Gives an explanation and review of dietary fats and fiber.   Cardiac Rehab from 12/10/2016 in [facility] Cardiac and Pulmonary Rehab  Date  12/03/16  Educator  PI  Instruction Review Code  1- Verbalizes Understanding      Controlling Sodium/Reading Food Labels: -Group verbal and written material supporting the discussion of sodium use in heart healthy nutrition. Review and explanation with models, verbal and written materials for utilization of the food label.   Cardiac Rehab from 12/10/2016  in [facility] Cardiac and Pulmonary Rehab  Date  12/10/16  Educator  CR  Instruction Review Code  1- Verbalizes Understanding      Exercise Physiology & Risk Factors: - Group verbal and written instruction with models to review the exercise physiology of the cardiovascular system and associated critical values. Details cardiovascular disease risk factors and the goals associated with each risk factor.   Cardiac Rehab from 12/10/2016 in [facility] Cardiac and Pulmonary Rehab  Date  10/24/16  Educator  [facility]  Instruction Review Code  1- Verbalizes Understanding      Aerobic Exercise & Resistance Training: - Gives group verbal and written discussion on the health impact of inactivity. On the components of aerobic and resistive training programs and the benefits of this training and how to safely progress through these programs.   Cardiac Rehab from 12/10/2016 in [facility] Cardiac and Pulmonary Rehab  Date  10/29/16  Educator  [facility]  Instruction Review Code  1- Verbalizes Understanding      Flexibility, Balance, General Exercise Guidelines: - Provides group verbal and written instruction on the benefits of flexibility and balance training programs. Provides general exercise guidelines with specific guidelines to those with heart or lung disease. Demonstration and skill practice provided.   Stress Management: - Provides group verbal and written instruction about the health risks of elevated stress, cause of high stress, and healthy ways to reduce stress.   Cardiac Rehab from 12/10/2016 in [facility] Cardiac and Pulmonary Rehab  Date  11/05/16  Educator  [facility]  Instruction Review Code  1- Verbalizes Understanding      Depression: - Provides group verbal and written instruction on the correlation between heart/lung disease and depressed mood, treatment options, and the stigmas associated with seeking treatment.   Cardiac Rehab from 12/10/2016 in [facility] Cardiac and Pulmonary Rehab  Date  10/03/16  Educator  [facility]   Instruction Review Code (retired)  1- partially meets, needs review/practice      Anatomy & Physiology of the Heart: - Group verbal and written instruction and models provide basic cardiac anatomy and physiology, with the coronary electrical and arterial systems. Review of: AMI, Angina, Valve disease, Heart Failure, Cardiac Arrhythmia, Pacemakers, and the ICD.  Cardiac Rehab from 12/10/2016 in [facility] Cardiac and Pulmonary Rehab  Date  11/07/16  Educator  [facility]  Instruction Review Code  1- Verbalizes Understanding      Cardiac Procedures: - Group verbal and written instruction to review commonly prescribed medications for heart disease. Reviews the medication, class of the drug, and side effects. Includes the steps to properly store meds and maintain the prescription regimen. (beta blockers and nitrates)   Cardiac Rehab from 12/10/2016 in [facility] Cardiac and Pulmonary Rehab  Date  11/12/16  Educator  SB  Instruction Review Code  1- Verbalizes Understanding      Cardiac Medications I: - Group verbal and written instruction to review commonly prescribed medications for heart disease. Reviews the medication, class of the drug, and side effects. Includes the steps to properly store meds and maintain the prescription regimen.   Cardiac Rehab from 12/10/2016 in [facility] Cardiac and Pulmonary Rehab  Date  11/19/16 [8/9 Part Two]  Educator  MA  Instruction Review Code  1- Verbalizes Understanding      Cardiac Medications II: -Group verbal and written instruction to review commonly prescribed medications for heart disease. Reviews the medication, class of the drug, and side effects. (all other drug classes)    Go Sex-Intimacy & Heart Disease, Get SMART - Goal Setting: - Group verbal and written instruction through game format to discuss heart disease and the return to sexual intimacy. Provides group verbal and written material to discuss and apply goal setting through the application of the  S.M.A.R.T. Method.   Cardiac Rehab from 12/10/2016 in [facility] Cardiac and Pulmonary Rehab  Date  11/12/16  Educator  SB  Instruction Review Code  1- Verbalizes Understanding      Other Matters of the Heart: - Provides group verbal, written materials and models to describe Heart Failure, Angina, Valve Disease, Peripheral Artery Disease, and Diabetes in the realm of heart disease. Includes description of the disease process and treatment options available to the cardiac patient.   Cardiac Rehab from 12/10/2016 in [facility] Cardiac and Pulmonary Rehab  Date  11/07/16  Educator  [facility]  Instruction Review Code  1- Verbalizes Understanding      Exercise & Equipment Safety: - Individual verbal instruction and demonstration of equipment use and safety with use of the equipment.   Cardiac Rehab from 12/10/2016 in [facility] Cardiac and Pulmonary Rehab  Date  09/12/16  Educator  [facility]      Infection Prevention: - Provides verbal and written material to individual with discussion of infection control including proper hand washing and proper equipment cleaning during exercise session.   Cardiac Rehab from 12/10/2016 in [facility] Cardiac and Pulmonary Rehab  Date  09/12/16  Educator  [facility]      Falls Prevention: - Provides verbal and written material to individual with discussion of falls prevention and safety.   Cardiac Rehab from 12/10/2016 in [facility] Cardiac and Pulmonary Rehab  Date  09/12/16  Educator  [facility]  Instruction Review Code (retired)  2- meets goals/outcomes      Diabetes: - Individual verbal and written instruction to review signs/symptoms of diabetes, desired ranges of glucose level fasting, after meals and with exercise. Acknowledge that pre and post exercise glucose checks will be done for 3 sessions at entry of program.   Other: -Provides group and verbal instruction on various topics (see comments)    Knowledge Questionnaire Score:     Knowledge Questionnaire Score - 11/19/16 1144       Knowledge Questionnaire Score  Pre Score 20/28   Post Score 26/28      Core Components/Risk Factors/Patient Goals at Admission:     Personal Goals and Risk Factors at Admission - 09/12/16 1333      Core Components/Risk Factors/Patient Goals on Admission   Hypertension Yes   Intervention Provide education on lifestyle modifcations including regular physical activity/exercise, weight management, moderate sodium restriction and increased consumption of fresh fruit, vegetables, and low fat dairy, alcohol moderation, and smoking cessation.;Monitor prescription use compliance.   Expected Outcomes Short Term: Continued assessment and intervention until BP is < 140/66m HG in hypertensive participants. < 130/884mHG in hypertensive participants with diabetes, heart failure or chronic kidney disease.;Long Term: Maintenance of blood pressure at goal levels.   Lipids Yes   Intervention Provide education and support for participant on nutrition & aerobic/resistive exercise along with prescribed medications to achieve LDL <7035mHDL >23m76m Expected Outcomes Short Term: Participant states understanding of desired cholesterol values and is compliant with medications prescribed. Participant is following exercise prescription and nutrition guidelines.;Long Term: Cholesterol controlled with medications as prescribed, with individualized exercise RX and with personalized nutrition plan. Value goals: LDL < 70mg7mL > 40 mg.   Stress Yes   Intervention Offer individual and/or small group education and counseling on adjustment to heart disease, stress management and health-related lifestyle change. Teach and support self-help strategies.;Refer participants experiencing significant psychosocial distress to appropriate mental health specialists for further evaluation and treatment. When possible, include family members and significant others in education/counseling sessions.   Expected Outcomes Short Term:  Participant demonstrates changes in health-related behavior, relaxation and other stress management skills, ability to obtain effective social support, and compliance with psychotropic medications if prescribed.;Long Term: Emotional wellbeing is indicated by absence of clinically significant psychosocial distress or social isolation.      Core Components/Risk Factors/Patient Goals Review:      Goals and Risk Factor Review    Row Name 10/17/16 1337 11/14/16 0833           Core Components/Risk Factors/Patient Goals Review   Personal Goals Review Other;Hypertension Weight Management/Obesity;Hypertension;Lipids      Review Phils BP has been good during HT measurements.  He sees his PCP tomorrow and will have results of other blood work.  He sees Dr HooteMarry Guany about knee replacement, although it will be when he finishes HT.  He has been walking some at home and doing strength and stretching. Weight has been up and down.  Today it was 234 lbs which is up a little.  Blood pressures have been good and staying within normal limits.  He continues to check it at home routinely.  Knee replacement is scheduled for end of Novemeber.  He feels that his meds continue to work well for him.      Expected Outcomes Short - [name] [name] continue to attend HT. Long - [name] will graduate and maintain his exercise after knee surgery. Short:  Continue to work on weight loss.  Long: Continue to work on risk factor modifications.         Core Components/Risk Factors/Patient Goals at Discharge (Final Review):      Goals and Risk Factor Review - 11/14/16 0833      Core Components/Risk Factors/Patient Goals Review   Personal Goals Review Weight Management/Obesity;Hypertension;Lipids   Review Weight has been up and down.  Today it was 234 lbs which is up a little.  Blood pressures have been good and staying within normal limits.  He  continues to check it at home routinely.  Knee replacement is scheduled for end of  Novemeber.  He feels that his meds continue to work well for him.   Expected Outcomes Short:  Continue to work on weight loss.  Long: Continue to work on risk factor modifications.      ITP Comments:     ITP Comments    Row Name 09/12/16 1325 10/02/16 0620 10/30/16 1118 11/27/16 0643 12/12/16 0926   ITP Comments Med Review completed. Initial ITP created. Diagnosis can be found in [place] 5/16 30 day review. Continue with ITP unless directed changes per Medical Director review  30 day review. Continue with ITP unless directed changes per Medical Director review.   30 day Review. Continue with ITP unless directed changes per Medical Director Review.  Pt graduated today.  Discharge ITP and summary sent.      Comments: Discharge ITP

## 2016-12-12 NOTE — Patient Instructions (Signed)
Discharge Instructions  Patient Details  Name: Craig [name] MRN: [id number] Date of Birth: [birth year] Referring Provider:  Yolonda Kida, MD   Number of Visits: 34/36  Reason for Discharge:  Patient reached a stable level of exercise. Patient independent in their exercise. Patient has met program and personal goals.  Smoking History:  History  Smoking Status  . Former Smoker  . Packs/day: 1.50  . Years: 20.00  . Quit date: 09/25/1983  Smokeless Tobacco  . Never Used    Diagnosis:  No diagnosis found.  Initial Exercise Prescription:     Initial Exercise Prescription - 09/12/16 1400      Date of Initial Exercise RX and Referring Provider   Date 09/12/16   Referring Provider [facility]     Treadmill   MPH 1.5   Grade 0   Minutes 15   METs 1.8     NuStep   Level 2   SPM 80   Minutes 15   METs 1.8     Arm Ergometer   Level 1   RPM 50   Minutes 15   METs 1.5     Prescription Details   Frequency (times per week) 3   Duration Progress to 45 minutes of aerobic exercise without signs/symptoms of physical distress     Intensity   THRR 40-80% of Max Heartrate 94-131   Ratings of Perceived Exertion 11-13   Perceived Dyspnea 0-4     Resistance Training   Training Prescription Yes   Weight 3 lb   Reps 10-15      Discharge Exercise Prescription (Final Exercise Prescription Changes):     Exercise Prescription Changes - 12/04/16 1200      Response to Exercise   Blood Pressure (Admit) 144/78   Blood Pressure (Exercise) 134/67   Blood Pressure (Exit) 126/64   Heart Rate (Admit) 67 bpm   Heart Rate (Exercise) 70 bpm   Heart Rate (Exit) 60 bpm   Rating of Perceived Exertion (Exercise) 13   Symptoms none   Duration Continue with 45 min of aerobic exercise without signs/symptoms of physical distress.   Intensity THRR unchanged     Progression   Progression Continue to progress workloads to maintain intensity without signs/symptoms of physical  distress.   Average METs 2.04     Resistance Training   Training Prescription Yes   Weight 3 lb   Reps 10-15     Interval Training   Interval Training No     Treadmill   MPH 1.3   Grade 0   Minutes 15   METs 2     NuStep   Level 1   Minutes 15   METs 2.3     Home Exercise Plan   Plans to continue exercise at Home (comment)  walking and chair exercises   Frequency Add 2 additional days to program exercise sessions.   Initial Home Exercises Provided 10/01/16      Functional Capacity:     6 Minute Walk    Row Name 09/12/16 1423 12/05/16 1026       6 Minute Walk   Phase Initial Discharge    Distance 795 feet 1125 feet    Distance % Change  - 41.5 %    Distance Feet Change  - 330 ft    Walk Time 6 minutes 6 minutes    # of Rest Breaks 0 0    MPH 1.5 2.13    METS 1.8 2.19    RPE  13 12    VO2 Peak 6.2 7.7    Symptoms Yes (comment) No    Comments knee pain 4/10 L knee did no use cane    Resting HR 58 bpm 51 bpm    Resting BP 122/84 140/70    Max Ex. HR 94 bpm 85 bpm    Max Ex. BP 148/86 142/80    2 Minute Post BP 134/82  -       Quality of Life:     Quality of Life - 11/19/16 1144      Quality of Life Scores   Health/Function Pre 20.79 %   Health/Function Post 23.93 %   Health/Function % Change 15.1 %   Socioeconomic Pre 21 %   Socioeconomic Post 26.57 %   Socioeconomic % Change  26.52 %   Psych/Spiritual Pre 21 %   Psych/Spiritual Post 28.29 %   Psych/Spiritual % Change 34.71 %   Family Pre 21 %   Family Post 22.8 %   Family % Change 8.57 %   GLOBAL Pre 20.91 %   GLOBAL Post 25.21 %   GLOBAL % Change 20.56 %      Personal Goals: Goals established at orientation with interventions provided to work toward goal.     Personal Goals and Risk Factors at Admission - 09/12/16 1333      Core Components/Risk Factors/Patient Goals on Admission   Hypertension Yes   Intervention Provide education on lifestyle modifcations including regular  physical activity/exercise, weight management, moderate sodium restriction and increased consumption of fresh fruit, vegetables, and low fat dairy, alcohol moderation, and smoking cessation.;Monitor prescription use compliance.   Expected Outcomes Short Term: Continued assessment and intervention until BP is < 140/82m HG in hypertensive participants. < 130/867mHG in hypertensive participants with diabetes, heart failure or chronic kidney disease.;Long Term: Maintenance of blood pressure at goal levels.   Lipids Yes   Intervention Provide education and support for participant on nutrition & aerobic/resistive exercise along with prescribed medications to achieve LDL <708mHDL >3m30m Expected Outcomes Short Term: Participant states understanding of desired cholesterol values and is compliant with medications prescribed. Participant is following exercise prescription and nutrition guidelines.;Long Term: Cholesterol controlled with medications as prescribed, with individualized exercise RX and with personalized nutrition plan. Value goals: LDL < 70mg24mL > 40 mg.   Stress Yes   Intervention Offer individual and/or small group education and counseling on adjustment to heart disease, stress management and health-related lifestyle change. Teach and support self-help strategies.;Refer participants experiencing significant psychosocial distress to appropriate mental health specialists for further evaluation and treatment. When possible, include family members and significant others in education/counseling sessions.   Expected Outcomes Short Term: Participant demonstrates changes in health-related behavior, relaxation and other stress management skills, ability to obtain effective social support, and compliance with psychotropic medications if prescribed.;Long Term: Emotional wellbeing is indicated by absence of clinically significant psychosocial distress or social isolation.       Personal Goals Discharge:      Goals and Risk Factor Review - 11/14/16 0833      Core Components/Risk Factors/Patient Goals Review   Personal Goals Review Weight Management/Obesity;Hypertension;Lipids   Review Weight has been up and down.  Today it was 234 lbs which is up a little.  Blood pressures have been good and staying within normal limits.  He continues to check it at home routinely.  Knee replacement is scheduled for end of Novemeber.  He feels that his meds continue  to work well for him.   Expected Outcomes Short:  Continue to work on weight loss.  Long: Continue to work on risk factor modifications.      Exercise Goals and Review:     Exercise Goals    Row Name 09/12/16 1423             Exercise Goals   Increase Physical Activity Yes       Intervention Provide advice, education, support and counseling about physical activity/exercise needs.;Develop an individualized exercise prescription for aerobic and resistive training based on initial evaluation findings, risk stratification, comorbidities and participant's personal goals.       Expected Outcomes Achievement of increased cardiorespiratory fitness and enhanced flexibility, muscular endurance and strength shown through measurements of functional capacity and personal statement of participant.       Increase Strength and Stamina Yes       Intervention Provide advice, education, support and counseling about physical activity/exercise needs.;Develop an individualized exercise prescription for aerobic and resistive training based on initial evaluation findings, risk stratification, comorbidities and participant's personal goals.       Expected Outcomes Achievement of increased cardiorespiratory fitness and enhanced flexibility, muscular endurance and strength shown through measurements of functional capacity and personal statement of participant.          Nutrition & Weight - Outcomes:     Pre Biometrics - 09/12/16 1422      Pre Biometrics   Height  5' 10.25" (1.784 m)   Weight 231 lb 9.6 oz (105.1 kg)   Waist Circumference 43.75 inches   Hip Circumference 46 inches   Waist to Hip Ratio 0.95 %   BMI (Calculated) 33.1   Single Leg Stand 13.27 seconds         Post Biometrics - 12/05/16 1025       Post  Biometrics   Height 5' 10.25" (1.784 m)   Weight 234 lb (106.1 kg)   Waist Circumference 43 inches   Hip Circumference 46 inches   Waist to Hip Ratio 0.93 %   BMI (Calculated) 33.35   Single Leg Stand 10.96 seconds      Nutrition:     Nutrition Therapy & Goals - 11/14/16 0840      Nutrition Therapy   RD appointment defered Yes      Nutrition Discharge:     Nutrition Assessments - 11/19/16 1144      MEDFICTS Scores   Pre Score 58   Post Score 77   Score Difference 19      Education Questionnaire Score:     Knowledge Questionnaire Score - 11/19/16 1144      Knowledge Questionnaire Score   Pre Score 20/28   Post Score 26/28      Goals reviewed with patient; copy given to patient.

## 2016-12-12 NOTE — Progress Notes (Signed)
Daily Session Note  Patient Details  Name: Craig [name] MRN: [id number] Date of Birth: June 17, 1945 Referring Provider:     Cardiac Rehab from 09/12/2016 in [facility] Cardiac and Pulmonary Rehab  Referring Provider  [facility]      Encounter Date: 12/12/2016  Check In:     Session Check In - 12/12/16 0924      Check-In   Location [facility]-Cardiac & Pulmonary Rehab   Staff Present Alberteen Sam, MA, ACSM RCEP, Exercise Physiologist;Amanda Oletta Darter, BA, ACSM CEP, Exercise Physiologist;Meredith Sherryll Burger, RN BSN   Supervising physician immediately available to respond to emergencies See telemetry face sheet for immediately available ER MD   Medication changes reported     No   Fall or balance concerns reported    No   Warm-up and Cool-down Performed on first and last piece of equipment   Resistance Training Performed Yes   VAD Patient? No     Pain Assessment   Currently in Pain? No/denies         History  Smoking Status  . Former Smoker  . Packs/day: 1.50  . Years: 20.00  . Quit date: 09/25/1983  Smokeless Tobacco  . Never Used    Goals Met:  Independence with exercise equipment Exercise tolerated well Personal goals reviewed No report of cardiac concerns or symptoms Strength training completed today  Goals Unmet:  Not Applicable  Comments:  Craig [name] graduated today from cardiac rehab with 34 sessions completed.  Details of the patient's exercise prescription and what He needs to do in order to continue the prescription and progress were discussed with patient.  Patient was given a copy of prescription and goals.  Patient verbalized understanding.  Craig [name] plans to continue to exercise by doing chair exercises at home.    Dr. Emily Filbert is Medical Director for [facility].

## 2016-12-12 NOTE — Progress Notes (Signed)
Discharge Progress Report  Patient Details  Name: Craig [name] MRN: [id number] Date of Birth: [birth year] Referring Provider:     Cardiac Rehab from 09/12/2016 in [facility]iac [facility]  Referring Provider  [facility]       Number of Visits: 34/36  Reason for Discharge:  Patient reached a stable level of exercise. Patient independent in their exercise. Patient has met program and personal goals.  Smoking History:  History  Smoking Status  . Former Smoker  . Packs/day: 1.50  . Years: 20.00  . Quit date: 09/25/1983  Smokeless Tobacco  . Never Used    Diagnosis:  S/P CABG x 3  ADL UCSD:   Initial Exercise Prescription:     Initial Exercise Prescription - 09/12/16 1400      Date of Initial Exercise RX and Referring Provider   Date 09/12/16   Referring Provider [facility]     Treadmill   MPH 1.5   Grade 0   Minutes 15   METs 1.8     NuStep   Level 2   SPM 80   Minutes 15   METs 1.8     Arm Ergometer   Level 1   RPM 50   Minutes 15   METs 1.5     Prescription Details   Frequency (times per week) 3   Duration Progress to 45 minutes of aerobic exercise without signs/symptoms of physical distress     Intensity   THRR 40-80% of Max Heartrate 94-131   Ratings of Perceived Exertion 11-13   Perceived Dyspnea 0-4     Resistance Training   Training Prescription Yes   Weight 3 lb   Reps 10-15      Discharge Exercise Prescription (Final Exercise Prescription Changes):     Exercise Prescription Changes - 12/04/16 1200      Response to Exercise   Blood Pressure (Admit) 144/78   Blood Pressure (Exercise) 134/67   Blood Pressure (Exit) 126/64   Heart Rate (Admit) 67 bpm   Heart Rate (Exercise) 70 bpm   Heart Rate (Exit) 60 bpm   Rating of Perceived Exertion (Exercise) 13   Symptoms none   Duration Continue with 45 min of aerobic exercise without signs/symptoms of physical distress.   Intensity THRR unchanged     Progression   Progression Continue to progress workloads to maintain intensity without signs/symptoms of physical distress.   Average METs 2.04     Resistance Training   Training Prescription Yes   Weight 3 lb   Reps 10-15     Interval Training   Interval Training No     Treadmill   MPH 1.3   Grade 0   Minutes 15   METs 2     NuStep   Level 1   Minutes 15   METs 2.3     Home Exercise Plan   Plans to continue exercise at Home (comment)  walking and chair exercises   Frequency Add 2 additional days to program exercise sessions.   Initial Home Exercises Provided 10/01/16      Functional Capacity:     6 Minute Walk    Row Name 09/12/16 1423 12/05/16 1026       6 Minute Walk   Phase Initial Discharge    Distance 795 feet 1125 feet    Distance % Change  - 41.5 %    Distance Feet Change  - 330 ft    Walk Time 6 minutes 6 minutes    #  of Rest Breaks 0 0    MPH 1.5 2.13    METS 1.8 2.19    RPE 13 12    VO2 Peak 6.2 7.7    Symptoms Yes (comment) No    Comments knee pain 4/10 L knee did no use cane    Resting HR 58 bpm 51 bpm    Resting BP 122/84 140/70    Max Ex. HR 94 bpm 85 bpm    Max Ex. BP 148/86 142/80    2 Minute Post BP 134/82  -       Psychological, QOL, Others - Outcomes: PHQ 2/9: Depression screen [facility] 2/9 11/19/2016 09/12/2016  Decreased Interest 1 2  Down, Depressed, Hopeless 0 0  PHQ - 2 Score 1 2  Altered sleeping 0 1  Tired, decreased energy 1 3  Change in appetite 0 0  Feeling bad or failure about yourself  0 0  Trouble concentrating 0 0  Moving slowly or fidgety/restless 0 1  Suicidal thoughts 0 0  PHQ-9 Score 2 7  Difficult doing work/chores Not difficult at all Somewhat difficult    Quality of Life:     Quality of Life - 11/19/16 1144      Quality of Life Scores   Health/Function Pre 20.79 %   Health/Function Post 23.93 %   Health/Function % Change 15.1 %   Socioeconomic Pre 21 %   Socioeconomic Post 26.57 %   Socioeconomic % Change  26.52  %   Psych/Spiritual Pre 21 %   Psych/Spiritual Post 28.29 %   Psych/Spiritual % Change 34.71 %   Family Pre 21 %   Family Post 22.8 %   Family % Change 8.57 %   GLOBAL Pre 20.91 %   GLOBAL Post 25.21 %   GLOBAL % Change 20.56 %      Personal Goals: Goals established at orientation with interventions provided to work toward goal.     Personal Goals and Risk Factors at Admission - 09/12/16 1333      Core Components/Risk Factors/Patient Goals on Admission   Hypertension Yes   Intervention Provide education on lifestyle modifcations including regular physical activity/exercise, weight management, moderate sodium restriction and increased consumption of fresh fruit, vegetables, and low fat dairy, alcohol moderation, and smoking cessation.;Monitor prescription use compliance.   Expected Outcomes Short Term: Continued assessment and intervention until BP is < 140/48m HG in hypertensive participants. < 130/834mHG in hypertensive participants with diabetes, heart failure or chronic kidney disease.;Long Term: Maintenance of blood pressure at goal levels.   Lipids Yes   Intervention Provide education and support for participant on nutrition & aerobic/resistive exercise along with prescribed medications to achieve LDL <7057mHDL >4m73m Expected Outcomes Short Term: Participant states understanding of desired cholesterol values and is compliant with medications prescribed. Participant is following exercise prescription and nutrition guidelines.;Long Term: Cholesterol controlled with medications as prescribed, with individualized exercise RX and with personalized nutrition plan. Value goals: LDL < 70mg32mL > 40 mg.   Stress Yes   Intervention Offer individual and/or small group education and counseling on adjustment to heart disease, stress management and health-related lifestyle change. Teach and support self-help strategies.;Refer participants experiencing significant psychosocial distress to  appropriate mental health specialists for further evaluation and treatment. When possible, include family members and significant others in education/counseling sessions.   Expected Outcomes Short Term: Participant demonstrates changes in health-related behavior, relaxation and other stress management skills, ability to obtain effective social support, and compliance  with psychotropic medications if prescribed.;Long Term: Emotional wellbeing is indicated by absence of clinically significant psychosocial distress or social isolation.       Personal Goals Discharge:     Goals and Risk Factor Review    Row Name 10/17/16 1337 11/14/16 3295           Core Components/Risk Factors/Patient Goals Review   Personal Goals Review Other;Hypertension Weight Management/Obesity;Hypertension;Lipids      Review Phils BP has been good during HT measurements.  He sees his PCP tomorrow and will have results of other blood work.  He sees Dr Marry Guan today about knee replacement, although it will be when he finishes HT.  He has been walking some at home and doing strength and stretching. Weight has been up and down.  Today it was 234 lbs which is up a little.  Blood pressures have been good and staying within normal limits.  He continues to check it at home routinely.  Knee replacement is scheduled for end of Novemeber.  He feels that his meds continue to work well for him.      Expected Outcomes Short - [name] will continue to attend HT. Long - [name] will graduate and maintain his exercise after knee surgery. Short:  Continue to work on weight loss.  Long: Continue to work on risk factor modifications.         Exercise Goals and Review:     Exercise Goals    Row Name 09/12/16 1423             Exercise Goals   Increase Physical Activity Yes       Intervention Provide advice, education, support and counseling about physical activity/exercise needs.;Develop an individualized exercise prescription for aerobic and  resistive training based on initial evaluation findings, risk stratification, comorbidities and participant's personal goals.       Expected Outcomes Achievement of increased cardiorespiratory fitness and enhanced flexibility, muscular endurance and strength shown through measurements of functional capacity and personal statement of participant.       Increase Strength and Stamina Yes       Intervention Provide advice, education, support and counseling about physical activity/exercise needs.;Develop an individualized exercise prescription for aerobic and resistive training based on initial evaluation findings, risk stratification, comorbidities and participant's personal goals.       Expected Outcomes Achievement of increased cardiorespiratory fitness and enhanced flexibility, muscular endurance and strength shown through measurements of functional capacity and personal statement of participant.          Nutrition & Weight - Outcomes:     Pre Biometrics - 09/12/16 1422      Pre Biometrics   Height 5' 10.25" (1.784 m)   Weight 231 lb 9.6 oz (105.1 kg)   Waist Circumference 43.75 inches   Hip Circumference 46 inches   Waist to Hip Ratio 0.95 %   BMI (Calculated) 33.1   Single Leg Stand 13.27 seconds         Post Biometrics - 12/05/16 1025       Post  Biometrics   Height 5' 10.25" (1.784 m)   Weight 234 lb (106.1 kg)   Waist Circumference 43 inches   Hip Circumference 46 inches   Waist to Hip Ratio 0.93 %   BMI (Calculated) 33.35   Single Leg Stand 10.96 seconds      Nutrition:     Nutrition Therapy & Goals - 11/14/16 0840      Nutrition Therapy   RD appointment defered  Yes      Nutrition Discharge:     Nutrition Assessments - 11/19/16 1144      MEDFICTS Scores   Pre Score 58   Post Score 77   Score Difference 19      Education Questionnaire Score:     Knowledge Questionnaire Score - 11/19/16 1144      Knowledge Questionnaire Score   Pre Score 20/28    Post Score 26/28      Goals reviewed with patient; copy given to patient.

## 2016-12-31 ENCOUNTER — Inpatient Hospital Stay: Admission: RE | Admit: 2016-12-31 | Payer: Medicare Other | Source: Ambulatory Visit

## 2017-01-02 ENCOUNTER — Other Ambulatory Visit: Payer: Self-pay

## 2017-01-02 ENCOUNTER — Encounter
Admission: RE | Admit: 2017-01-02 | Discharge: 2017-01-02 | Disposition: A | Discharge status: Home / Self Care | Payer: Medicare Other | Source: Ambulatory Visit | Attending: Orthopedic Surgery | Admitting: Orthopedic Surgery

## 2017-01-02 DIAGNOSIS — Z01812 Encounter for preprocedural laboratory examination: Secondary | ICD-10-CM | POA: Insufficient documentation

## 2017-01-02 LAB — TYPE AND SCREEN
ABO/RH(D): A POS
ANTIBODY SCREEN: NEGATIVE

## 2017-01-02 LAB — COMPREHENSIVE METABOLIC PANEL
ALBUMIN: 3.8 g/dL (ref 3.5–5.0)
ALT: 20 U/L (ref 17–63)
ANION GAP: 7 (ref 5–15)
AST: 17 U/L (ref 15–41)
Alkaline Phosphatase: 82 U/L (ref 38–126)
BILIRUBIN TOTAL: 0.6 mg/dL (ref 0.3–1.2)
BUN: 15 mg/dL (ref 6–20)
CHLORIDE: 107 mmol/L (ref 101–111)
CO2: 26 mmol/L (ref 22–32)
Calcium: 8.7 mg/dL — ABNORMAL LOW (ref 8.9–10.3)
Creatinine, Ser: 1.06 mg/dL (ref 0.61–1.24)
GFR calc Af Amer: >60 mL/min (ref >60)
GFR calc non Af Amer: >60 mL/min (ref >60)
GLUCOSE: 97 mg/dL (ref 65–99)
POTASSIUM: 4.1 mmol/L (ref 3.5–5.1)
SODIUM: 140 mmol/L (ref 135–145)
TOTAL PROTEIN: 6.5 g/dL (ref 6.5–8.1)

## 2017-01-02 LAB — CBC
HEMATOCRIT: 39.1 % — AB (ref 40.0–52.0)
HEMOGLOBIN: 12.8 g/dL — AB (ref 13.0–18.0)
MCH: 31.4 pg (ref 26.0–34.0)
MCHC: 32.8 g/dL (ref 32.0–36.0)
MCV: 95.8 fL (ref 80.0–100.0)
Platelets: 168 10*3/uL (ref 150–440)
RBC: 4.09 MIL/uL — ABNORMAL LOW (ref 4.40–5.90)
RDW: 15.2 % — AB (ref 11.5–14.5)
WBC: 4.5 10*3/uL (ref 3.8–10.6)

## 2017-01-02 LAB — URINALYSIS, ROUTINE W REFLEX MICROSCOPIC
Bilirubin Urine: NEGATIVE
Glucose, UA: NEGATIVE mg/dL
HGB URINE DIPSTICK: NEGATIVE
KETONES UR: NEGATIVE mg/dL
Leukocytes, UA: NEGATIVE
NITRITE: NEGATIVE
PROTEIN: NEGATIVE mg/dL
Specific Gravity, Urine: 1.016 (ref 1.005–1.030)
pH: 5 (ref 5.0–8.0)

## 2017-01-02 LAB — C-REACTIVE PROTEIN

## 2017-01-02 LAB — SURGICAL PCR SCREEN
MRSA, PCR: NEGATIVE
STAPHYLOCOCCUS AUREUS: POSITIVE — AB

## 2017-01-02 LAB — SEDIMENTATION RATE: Sed Rate: 12 mm/hr (ref 0–20)

## 2017-01-02 LAB — APTT: APTT: 32 s (ref 24–36)

## 2017-01-02 LAB — PROTIME-INR
INR: 0.97
Prothrombin Time: 12.8 seconds (ref 11.4–15.2)

## 2017-01-02 NOTE — Pre-Procedure Instructions (Signed)
Abnormal labs faxed to Dr. Clydell Hakim office.

## 2017-01-02 NOTE — Patient Instructions (Signed)
Your procedure is scheduled on: Wed. 11/28 Report to Day Surgery. To find out your arrival time please call [phone] between 1PM - 3PM on Tues 11/27.  Remember: Instructions that are not followed completely may result in serious medical risk, up to and including death, or upon the discretion of your surgeon and anesthesiologist your surgery may need to be rescheduled.     _X__ 1. Do not eat food after midnight the night before your procedure.                 No gum chewing or hard candies. You may drink clear liquids up to 2 hours                 before you are scheduled to arrive for your surgery- DO not drink clear                 liquids within 2 hours of the start of your surgery.                 Clear Liquids include:  water, apple juice without pulp, clear carbohydrate                 drink such as Clearfast of Gartorade, Black Coffee or Tea (Do not add                 anything to coffee or tea).     ___ 2.  No Alcohol for 24 hours before or after surgery.   ___ 3.  Do Not Smoke or use e-cigarettes For 24 Hours Prior to Your Surgery.                 Do not use any chewable tobacco products for at least 6 hours prior to                 surgery.  ____  4.  Bring all medications with you on the day of surgery if instructed.   _x___  5.  Notify your doctor if there is any change in your medical condition      (cold, fever, infections).     Do not wear jewelry, make-up, hairpins, clips or nail polish. Do not wear lotions, powders, or perfumes. You may wear deodorant. Do not shave 48 hours prior to surgery. Men may shave face and neck. Do not bring valuables to the hospital.    [facility] is not responsible for any belongings or valuables.  Contacts, dentures or bridgework may not be worn into surgery. Leave your suitcase in the car. After surgery it may be brought to your room. For patients admitted to the hospital, discharge time is determined by  your treatment team.   Patients discharged the day of surgery will not be allowed to drive home.   Please read over the following fact sheets that you were given:   MRSA Information   _x__ Take these medicines the morning of surgery with A SIP OF WATER:    1. acetaminophen (TYLENOL) 500 MG tablet/ traMADol (ULTRAM) 50 MG tabletif needed  2. cetirizine (ZYRTEC) 10 MG tablet  3. fluticasone (FLONASE) 50 MCG/ACT nasal spray  4.FLUoxetine (PROZAC) 20 MG capsule if a morning med.  5.gabapentin (NEURONTIN) 100 MG capsule  6.omeprazole (PRILOSEC) 40 MG capsule  ____ Fleet Enema (as directed)   _x___ Use CHG Soap as directed  ____ Use inhalers on the day of surgery  ____ Stop metformin 2 days prior to surgery  ____ Take 1/2 of usual insulin dose the night before surgery. No insulin the morning          of surgery.   __x__ Stop Eliquis as per Cardiologist recommendation   ____ Stop Anti-inflammatories on    __x__ Stop supplements vitamin E 400 UNIT capsule on 11/21  ____ Bring C-Pap to the hospital.

## 2017-01-02 NOTE — Pre-Procedure Instructions (Signed)
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

## 2017-01-03 NOTE — Pre-Procedure Instructions (Signed)
Positive Staph aureus sent to Dr. Marry Guan for review.  Asked if wanted any treatment.

## 2017-01-04 LAB — URINE CULTURE
Culture: 80000 — AB
Special Requests: NORMAL

## 2017-01-06 NOTE — Pre-Procedure Instructions (Signed)
UC FAXED TO DR HOOTEN 

## 2017-01-13 NOTE — Pre-Procedure Instructions (Signed)
Roosevelt Locks PA at Dr. Clydell Hakim office instructed patient to stop Eliquis 5 days before surgery.

## 2017-01-14 MED ORDER — CEFAZOLIN SODIUM-DEXTROSE 2-4 GM/100ML-% IV SOLN
2.0000 g | INTRAVENOUS | Status: AC
  Administered 2017-01-15: 2 g via INTRAVENOUS

## 2017-01-14 MED ORDER — TRANEXAMIC ACID 1000 MG/10ML IV SOLN
1000.0000 mg | INTRAVENOUS | Status: AC
  Administered 2017-01-15: 1000 mg via INTRAVENOUS
  Filled 2017-01-14: qty 10

## 2017-01-15 ENCOUNTER — Encounter
Admission: RE | Disposition: A | Discharge status: Home Health | Payer: Self-pay | Source: Ambulatory Visit | Attending: Orthopedic Surgery

## 2017-01-15 ENCOUNTER — Ambulatory Visit: Payer: Medicare Other | Admitting: Anesthesiology

## 2017-01-15 ENCOUNTER — Other Ambulatory Visit: Payer: Self-pay

## 2017-01-15 ENCOUNTER — Inpatient Hospital Stay: Payer: Medicare Other

## 2017-01-15 ENCOUNTER — Inpatient Hospital Stay
Admission: RE | Admit: 2017-01-15 | Discharge: 2017-01-17 | DRG: 470 | Disposition: A | Discharge status: Home Health | Payer: Medicare Other | Source: Ambulatory Visit | Attending: Orthopedic Surgery | Admitting: Orthopedic Surgery

## 2017-01-15 ENCOUNTER — Encounter: Payer: Self-pay | Admitting: Orthopedic Surgery

## 2017-01-15 DIAGNOSIS — I129 Hypertensive chronic kidney disease with stage 1 through stage 4 chronic kidney disease, or unspecified chronic kidney disease: Secondary | ICD-10-CM | POA: Diagnosis present

## 2017-01-15 DIAGNOSIS — Z79899 Other long term (current) drug therapy: Secondary | ICD-10-CM | POA: Diagnosis not present

## 2017-01-15 DIAGNOSIS — F329 Major depressive disorder, single episode, unspecified: Secondary | ICD-10-CM | POA: Diagnosis present

## 2017-01-15 DIAGNOSIS — Z87891 Personal history of nicotine dependence: Secondary | ICD-10-CM

## 2017-01-15 DIAGNOSIS — Z951 Presence of aortocoronary bypass graft: Secondary | ICD-10-CM

## 2017-01-15 DIAGNOSIS — Z905 Acquired absence of kidney: Secondary | ICD-10-CM

## 2017-01-15 DIAGNOSIS — E7800 Pure hypercholesterolemia, unspecified: Secondary | ICD-10-CM | POA: Diagnosis present

## 2017-01-15 DIAGNOSIS — Z7951 Long term (current) use of inhaled steroids: Secondary | ICD-10-CM | POA: Diagnosis not present

## 2017-01-15 DIAGNOSIS — Z8673 Personal history of transient ischemic attack (TIA), and cerebral infarction without residual deficits: Secondary | ICD-10-CM

## 2017-01-15 DIAGNOSIS — N189 Chronic kidney disease, unspecified: Secondary | ICD-10-CM | POA: Diagnosis present

## 2017-01-15 DIAGNOSIS — J449 Chronic obstructive pulmonary disease, unspecified: Secondary | ICD-10-CM | POA: Diagnosis present

## 2017-01-15 DIAGNOSIS — M1712 Unilateral primary osteoarthritis, left knee: Principal | ICD-10-CM | POA: Diagnosis present

## 2017-01-15 DIAGNOSIS — I2510 Atherosclerotic heart disease of native coronary artery without angina pectoris: Secondary | ICD-10-CM | POA: Diagnosis present

## 2017-01-15 DIAGNOSIS — K219 Gastro-esophageal reflux disease without esophagitis: Secondary | ICD-10-CM | POA: Diagnosis present

## 2017-01-15 DIAGNOSIS — Z96659 Presence of unspecified artificial knee joint: Secondary | ICD-10-CM

## 2017-01-15 HISTORY — PX: KNEE ARTHROPLASTY: SHX992

## 2017-01-15 SURGERY — ARTHROPLASTY, KNEE, TOTAL, USING IMAGELESS COMPUTER-ASSISTED NAVIGATION
Anesthesia: Spinal | Site: Knee | Laterality: Left | Wound class: Clean

## 2017-01-15 MED ORDER — MIDAZOLAM HCL 5 MG/5ML IJ SOLN
INTRAMUSCULAR | Status: DC | PRN
  Administered 2017-01-15: 1 mg via INTRAVENOUS

## 2017-01-15 MED ORDER — ALUM & MAG HYDROXIDE-SIMETH 200-200-20 MG/5ML PO SUSP: 30.0000 mL | ORAL | Status: DC | PRN

## 2017-01-15 MED ORDER — PHENOL 1.4 % MT LIQD
1.0000 | OROMUCOSAL | Status: DC | PRN
  Filled 2017-01-15: qty 177

## 2017-01-15 MED ORDER — PROPOFOL 10 MG/ML IV BOLUS
INTRAVENOUS | Status: DC | PRN
  Administered 2017-01-15: 20 mg via INTRAVENOUS

## 2017-01-15 MED ORDER — FENTANYL CITRATE (PF) 100 MCG/2ML IJ SOLN: 25.0000 ug | INTRAMUSCULAR | Status: DC | PRN

## 2017-01-15 MED ORDER — FLEET ENEMA 7-19 GM/118ML RE ENEM: 1.0000 | ENEMA | Freq: Once | RECTAL | Status: DC | PRN

## 2017-01-15 MED ORDER — PROPOFOL 500 MG/50ML IV EMUL
INTRAVENOUS | Status: AC
  Filled 2017-01-15: qty 50

## 2017-01-15 MED ORDER — ACETAMINOPHEN 325 MG PO TABS: 650.0000 mg | ORAL_TABLET | ORAL | Status: DC | PRN

## 2017-01-15 MED ORDER — CEFAZOLIN SODIUM-DEXTROSE 2-4 GM/100ML-% IV SOLN
INTRAVENOUS | Status: AC
  Filled 2017-01-15: qty 100

## 2017-01-15 MED ORDER — CEFAZOLIN SODIUM-DEXTROSE 2-4 GM/100ML-% IV SOLN: 2.0000 g | Freq: Four times a day (QID) | INTRAVENOUS | Status: DC

## 2017-01-15 MED ORDER — TAMSULOSIN HCL 0.4 MG PO CAPS
0.4000 mg | ORAL_CAPSULE | Freq: Every day | ORAL | Status: DC
  Administered 2017-01-15 – 2017-01-16 (×2): 0.4 mg via ORAL
  Filled 2017-01-15 (×2): qty 1

## 2017-01-15 MED ORDER — MENTHOL 3 MG MT LOZG
1.0000 | LOZENGE | OROMUCOSAL | Status: DC | PRN
  Filled 2017-01-15: qty 9

## 2017-01-15 MED ORDER — SENNOSIDES-DOCUSATE SODIUM 8.6-50 MG PO TABS
1.0000 | ORAL_TABLET | Freq: Two times a day (BID) | ORAL | Status: DC
  Administered 2017-01-15 – 2017-01-17 (×4): 1 via ORAL
  Filled 2017-01-15 (×4): qty 1

## 2017-01-15 MED ORDER — BISACODYL 10 MG RE SUPP
10.0000 mg | Freq: Every day | RECTAL | Status: DC | PRN
  Administered 2017-01-17: 10 mg via RECTAL
  Filled 2017-01-15: qty 1

## 2017-01-15 MED ORDER — MORPHINE SULFATE (PF) 2 MG/ML IV SOLN: 2.0000 mg | INTRAVENOUS | Status: DC | PRN

## 2017-01-15 MED ORDER — BUPIVACAINE HCL (PF) 0.5 % IJ SOLN
INTRAMUSCULAR | Status: DC | PRN
  Administered 2017-01-15: 2.7 mL

## 2017-01-15 MED ORDER — KETAMINE HCL 50 MG/ML IJ SOLN
INTRAMUSCULAR | Status: DC | PRN
  Administered 2017-01-15: 2 mg via INTRAMUSCULAR

## 2017-01-15 MED ORDER — NEOMYCIN-POLYMYXIN B GU 40-200000 IR SOLN
Status: DC | PRN
  Administered 2017-01-15: 14 mL

## 2017-01-15 MED ORDER — SODIUM CHLORIDE 0.9 % IV SOLN
INTRAVENOUS | Status: DC | PRN
  Administered 2017-01-15: 7 ug/kg/min via INTRAVENOUS

## 2017-01-15 MED ORDER — OXYCODONE HCL 5 MG PO TABS
10.0000 mg | ORAL_TABLET | ORAL | Status: DC | PRN
Start: 2017-01-15 — End: 2017-01-17
  Administered 2017-01-15 – 2017-01-17 (×8): 10 mg via ORAL
  Filled 2017-01-15 (×7): qty 2

## 2017-01-15 MED ORDER — FINASTERIDE 5 MG PO TABS
5.0000 mg | ORAL_TABLET | Freq: Every day | ORAL | Status: DC
  Administered 2017-01-15 – 2017-01-16 (×2): 5 mg via ORAL
  Filled 2017-01-15 (×2): qty 1

## 2017-01-15 MED ORDER — TRANEXAMIC ACID 1000 MG/10ML IV SOLN
1000.0000 mg | Freq: Once | INTRAVENOUS | Status: AC
  Administered 2017-01-15: 1000 mg via INTRAVENOUS
  Filled 2017-01-15: qty 10

## 2017-01-15 MED ORDER — DIPHENHYDRAMINE HCL 12.5 MG/5ML PO ELIX: 12.5000 mg | ORAL_SOLUTION | ORAL | Status: DC | PRN

## 2017-01-15 MED ORDER — FLUOXETINE HCL 20 MG PO CAPS
20.0000 mg | ORAL_CAPSULE | Freq: Every day | ORAL | Status: DC
  Administered 2017-01-16 – 2017-01-17 (×2): 20 mg via ORAL
  Filled 2017-01-15 (×2): qty 1

## 2017-01-15 MED ORDER — BUPIVACAINE HCL (PF) 0.25 % IJ SOLN
INTRAMUSCULAR | Status: AC
  Filled 2017-01-15: qty 60

## 2017-01-15 MED ORDER — ONDANSETRON HCL 4 MG/2ML IJ SOLN: 4.0000 mg | Freq: Once | INTRAMUSCULAR | Status: DC | PRN

## 2017-01-15 MED ORDER — LIDOCAINE HCL (PF) 2 % IJ SOLN
INTRAMUSCULAR | Status: AC
  Filled 2017-01-15: qty 10

## 2017-01-15 MED ORDER — SODIUM CHLORIDE 0.9 % IJ SOLN
INTRAMUSCULAR | Status: AC
  Filled 2017-01-15: qty 50

## 2017-01-15 MED ORDER — PROPOFOL 10 MG/ML IV BOLUS
INTRAVENOUS | Status: AC
  Filled 2017-01-15: qty 20

## 2017-01-15 MED ORDER — APIXABAN 5 MG PO TABS
5.0000 mg | ORAL_TABLET | Freq: Two times a day (BID) | ORAL | Status: DC
  Administered 2017-01-16 – 2017-01-17 (×3): 5 mg via ORAL
  Filled 2017-01-15 (×3): qty 1

## 2017-01-15 MED ORDER — DEXAMETHASONE SODIUM PHOSPHATE 4 MG/ML IJ SOLN
INTRAMUSCULAR | Status: DC | PRN
  Administered 2017-01-15: 5 mg via INTRAVENOUS

## 2017-01-15 MED ORDER — SODIUM CHLORIDE 0.9 % IV SOLN
INTRAVENOUS | Status: DC
  Administered 2017-01-15 – 2017-01-16 (×2): via INTRAVENOUS

## 2017-01-15 MED ORDER — SODIUM CHLORIDE 0.9 % IV SOLN
INTRAVENOUS | Status: DC | PRN
  Administered 2017-01-15: 30 ug/min via INTRAVENOUS

## 2017-01-15 MED ORDER — ONDANSETRON HCL 4 MG/2ML IJ SOLN: 4.0000 mg | Freq: Four times a day (QID) | INTRAMUSCULAR | Status: DC | PRN

## 2017-01-15 MED ORDER — FENTANYL CITRATE (PF) 100 MCG/2ML IJ SOLN
INTRAMUSCULAR | Status: DC | PRN
  Administered 2017-01-15: 50 ug via INTRAVENOUS
  Administered 2017-01-15: 25 ug via INTRAVENOUS

## 2017-01-15 MED ORDER — PROPOFOL 500 MG/50ML IV EMUL
INTRAVENOUS | Status: DC | PRN
  Administered 2017-01-15: 70 ug/kg/min via INTRAVENOUS

## 2017-01-15 MED ORDER — ONDANSETRON HCL 4 MG/2ML IJ SOLN
INTRAMUSCULAR | Status: DC | PRN
  Administered 2017-01-15: 4 mg via INTRAVENOUS

## 2017-01-15 MED ORDER — GLYCOPYRROLATE 0.2 MG/ML IJ SOLN
INTRAMUSCULAR | Status: DC | PRN
  Administered 2017-01-15: 0.2 mg via INTRAVENOUS

## 2017-01-15 MED ORDER — BUPIVACAINE LIPOSOME 1.3 % IJ SUSP
INTRAMUSCULAR | Status: AC
  Filled 2017-01-15: qty 20

## 2017-01-15 MED ORDER — GLYCOPYRROLATE 0.2 MG/ML IJ SOLN
INTRAMUSCULAR | Status: AC
  Filled 2017-01-15: qty 1

## 2017-01-15 MED ORDER — LABETALOL HCL 5 MG/ML IV SOLN
INTRAVENOUS | Status: DC | PRN
  Administered 2017-01-15: 5 mg via INTRAVENOUS
  Administered 2017-01-15: 10 mg via INTRAVENOUS

## 2017-01-15 MED ORDER — ONDANSETRON HCL 4 MG PO TABS: 4.0000 mg | ORAL_TABLET | Freq: Four times a day (QID) | ORAL | Status: DC | PRN

## 2017-01-15 MED ORDER — MAGNESIUM HYDROXIDE 400 MG/5ML PO SUSP
30.0000 mL | Freq: Every day | ORAL | Status: DC | PRN
  Administered 2017-01-16 – 2017-01-17 (×2): 30 mL via ORAL
  Filled 2017-01-15 (×4): qty 30

## 2017-01-15 MED ORDER — FENTANYL CITRATE (PF) 100 MCG/2ML IJ SOLN
INTRAMUSCULAR | Status: AC
  Filled 2017-01-15: qty 2

## 2017-01-15 MED ORDER — VITAMIN E 180 MG (400 UNIT) PO CAPS
800.0000 [IU] | ORAL_CAPSULE | Freq: Every day | ORAL | Status: DC
  Administered 2017-01-16 – 2017-01-17 (×2): 800 [IU] via ORAL
  Filled 2017-01-15 (×3): qty 2

## 2017-01-15 MED ORDER — LACTATED RINGERS IV SOLN
INTRAVENOUS | Status: DC
  Administered 2017-01-15 (×2): via INTRAVENOUS

## 2017-01-15 MED ORDER — NEOMYCIN-POLYMYXIN B GU 40-200000 IR SOLN
Status: AC
  Filled 2017-01-15: qty 20

## 2017-01-15 MED ORDER — TRAZODONE HCL 50 MG PO TABS
50.0000 mg | ORAL_TABLET | Freq: Every day | ORAL | Status: DC
  Administered 2017-01-15 – 2017-01-16 (×2): 50 mg via ORAL
  Filled 2017-01-15 (×2): qty 1

## 2017-01-15 MED ORDER — TRAMADOL HCL 50 MG PO TABS
50.0000 mg | ORAL_TABLET | ORAL | Status: DC | PRN
  Administered 2017-01-16 – 2017-01-17 (×2): 100 mg via ORAL
  Filled 2017-01-15 (×2): qty 2

## 2017-01-15 MED ORDER — FERROUS SULFATE 325 (65 FE) MG PO TABS
325.0000 mg | ORAL_TABLET | Freq: Two times a day (BID) | ORAL | Status: DC
  Administered 2017-01-16 – 2017-01-17 (×3): 325 mg via ORAL
  Filled 2017-01-15 (×3): qty 1

## 2017-01-15 MED ORDER — PANTOPRAZOLE SODIUM 40 MG PO TBEC
40.0000 mg | DELAYED_RELEASE_TABLET | Freq: Two times a day (BID) | ORAL | Status: DC
  Administered 2017-01-15 – 2017-01-17 (×4): 40 mg via ORAL
  Filled 2017-01-15 (×4): qty 1

## 2017-01-15 MED ORDER — DEXTROSE 5 % IV SOLN
2.0000 g | Freq: Four times a day (QID) | INTRAVENOUS | Status: AC
  Administered 2017-01-15 – 2017-01-16 (×4): 2 g via INTRAVENOUS
  Filled 2017-01-15 (×4): qty 2000

## 2017-01-15 MED ORDER — OXYCODONE HCL 5 MG PO TABS
5.0000 mg | ORAL_TABLET | ORAL | Status: DC | PRN
  Administered 2017-01-15: 5 mg via ORAL
  Filled 2017-01-15 (×4): qty 1

## 2017-01-15 MED ORDER — CHLORHEXIDINE GLUCONATE 4 % EX LIQD: 60.0000 mL | Freq: Once | CUTANEOUS | Status: DC

## 2017-01-15 MED ORDER — SODIUM CHLORIDE 0.9 % IV SOLN
INTRAVENOUS | Status: DC | PRN
  Administered 2017-01-15: 60 mL

## 2017-01-15 MED ORDER — LORATADINE 10 MG PO TABS
10.0000 mg | ORAL_TABLET | Freq: Every day | ORAL | Status: DC
  Administered 2017-01-16 – 2017-01-17 (×2): 10 mg via ORAL
  Filled 2017-01-15 (×2): qty 1

## 2017-01-15 MED ORDER — METOCLOPRAMIDE HCL 10 MG PO TABS
10.0000 mg | ORAL_TABLET | Freq: Three times a day (TID) | ORAL | Status: DC
  Administered 2017-01-15 – 2017-01-17 (×7): 10 mg via ORAL
  Filled 2017-01-15 (×7): qty 1

## 2017-01-15 MED ORDER — ACETAMINOPHEN 10 MG/ML IV SOLN
1000.0000 mg | Freq: Four times a day (QID) | INTRAVENOUS | Status: AC
  Administered 2017-01-15 – 2017-01-16 (×4): 1000 mg via INTRAVENOUS
  Filled 2017-01-15 (×4): qty 100

## 2017-01-15 MED ORDER — MIDAZOLAM HCL 2 MG/2ML IJ SOLN
INTRAMUSCULAR | Status: AC
  Filled 2017-01-15: qty 2

## 2017-01-15 MED ORDER — TETRACAINE HCL 1 % IJ SOLN
INTRAMUSCULAR | Status: DC | PRN
  Administered 2017-01-15: 3 mg via INTRASPINAL

## 2017-01-15 MED ORDER — BUPIVACAINE HCL (PF) 0.25 % IJ SOLN
INTRAMUSCULAR | Status: DC | PRN
  Administered 2017-01-15: 60 mL

## 2017-01-15 MED ORDER — GABAPENTIN 100 MG PO CAPS
100.0000 mg | ORAL_CAPSULE | Freq: Two times a day (BID) | ORAL | Status: DC
Start: 2017-01-15 — End: 2017-01-17
  Administered 2017-01-15 – 2017-01-17 (×4): 100 mg via ORAL
  Filled 2017-01-15 (×4): qty 1

## 2017-01-15 MED ORDER — ADULT MULTIVITAMIN W/MINERALS CH
1.0000 | ORAL_TABLET | Freq: Every day | ORAL | Status: DC
  Administered 2017-01-16 – 2017-01-17 (×2): 1 via ORAL
  Filled 2017-01-15 (×2): qty 1

## 2017-01-15 MED ORDER — ATORVASTATIN CALCIUM 20 MG PO TABS
80.0000 mg | ORAL_TABLET | Freq: Every day | ORAL | Status: DC
  Administered 2017-01-15 – 2017-01-16 (×2): 80 mg via ORAL
  Filled 2017-01-15 (×2): qty 4

## 2017-01-15 MED ORDER — FLUTICASONE PROPIONATE 50 MCG/ACT NA SUSP
2.0000 | Freq: Every day | NASAL | Status: DC
  Administered 2017-01-16 – 2017-01-17 (×2): 2 via NASAL
  Filled 2017-01-15: qty 16

## 2017-01-15 MED ORDER — ACETAMINOPHEN 650 MG RE SUPP: 650.0000 mg | RECTAL | Status: DC | PRN

## 2017-01-15 SURGICAL SUPPLY — 70 items
BATTERY INSTRU NAVIGATION (MISCELLANEOUS) ×8 IMPLANT
BLADE CLIPPER SURG (BLADE) ×2 IMPLANT
BLADE SAW 1 (BLADE) ×2 IMPLANT
BLADE SAW 1/2 (BLADE) ×2 IMPLANT
BLADE SAW 70X12.5 (BLADE) IMPLANT
CANISTER SUCT 1200ML W/VALVE (MISCELLANEOUS) ×2 IMPLANT
CANISTER SUCT 3000ML PPV (MISCELLANEOUS) ×4 IMPLANT
CAP KNEE TOTAL 3 SIGMA ×2 IMPLANT
CATH COUDE FOLEY 2W 5CC 18FR (CATHETERS) ×4 IMPLANT
CEMENT HV SMART SET (Cement) ×4 IMPLANT
COOLER POLAR GLACIER W/PUMP (MISCELLANEOUS) ×2 IMPLANT
CUFF TOURN 24 STER (MISCELLANEOUS) IMPLANT
CUFF TOURN 30 STER DUAL PORT (MISCELLANEOUS) ×2 IMPLANT
DRAPE SHEET LG 3/4 BI-LAMINATE (DRAPES) ×2 IMPLANT
DRSG DERMACEA 8X12 NADH (GAUZE/BANDAGES/DRESSINGS) ×2 IMPLANT
DRSG OPSITE POSTOP 4X14 (GAUZE/BANDAGES/DRESSINGS) ×2 IMPLANT
DRSG TEGADERM 4X4.75 (GAUZE/BANDAGES/DRESSINGS) ×2 IMPLANT
DURAPREP 26ML APPLICATOR (WOUND CARE) ×4 IMPLANT
ELECT CAUTERY BLADE 6.4 (BLADE) ×2 IMPLANT
ELECT REM PT RETURN 9FT ADLT (ELECTROSURGICAL) ×2
ELECTRODE REM PT RTRN 9FT ADLT (ELECTROSURGICAL) ×1 IMPLANT
EVACUATOR 1/8 PVC DRAIN (DRAIN) ×2 IMPLANT
EX-PIN ORTHOLOCK NAV 4X150 (PIN) ×4 IMPLANT
GLOVE BIO SURGEON STRL SZ 6.5 (GLOVE) ×6 IMPLANT
GLOVE BIOGEL M STRL SZ7.5 (GLOVE) ×4 IMPLANT
GLOVE BIOGEL PI IND STRL 7.0 (GLOVE) ×1 IMPLANT
GLOVE BIOGEL PI IND STRL 7.5 (GLOVE) ×4 IMPLANT
GLOVE BIOGEL PI IND STRL 9 (GLOVE) ×1 IMPLANT
GLOVE BIOGEL PI INDICATOR 7.0 (GLOVE) ×1
GLOVE BIOGEL PI INDICATOR 7.5 (GLOVE) ×4
GLOVE BIOGEL PI INDICATOR 9 (GLOVE) ×1
GLOVE INDICATOR 8.0 STRL GRN (GLOVE) ×2 IMPLANT
GLOVE SURG SYN 9.0  PF PI (GLOVE) ×1
GLOVE SURG SYN 9.0 PF PI (GLOVE) ×1 IMPLANT
GOWN STRL REUS W/ TWL LRG LVL3 (GOWN DISPOSABLE) ×2 IMPLANT
GOWN STRL REUS W/TWL 2XL LVL3 (GOWN DISPOSABLE) ×2 IMPLANT
GOWN STRL REUS W/TWL LRG LVL3 (GOWN DISPOSABLE) ×2
HOLDER FOLEY CATH W/STRAP (MISCELLANEOUS) ×2 IMPLANT
HOOD PEEL AWAY FLYTE STAYCOOL (MISCELLANEOUS) ×4 IMPLANT
KIT RM TURNOVER STRD PROC AR (KITS) ×2 IMPLANT
KNIFE SCULPS 14X20 (INSTRUMENTS) ×2 IMPLANT
LABEL OR SOLS (LABEL) ×2 IMPLANT
NDL SAFETY ECLIPSE 18X1.5 (NEEDLE) ×1 IMPLANT
NEEDLE HYPO 18GX1.5 SHARP (NEEDLE) ×1
NEEDLE SPNL 20GX3.5 QUINCKE YW (NEEDLE) ×4 IMPLANT
NS IRRIG 500ML POUR BTL (IV SOLUTION) ×2 IMPLANT
PACK TOTAL KNEE (MISCELLANEOUS) ×2 IMPLANT
PAD WRAPON POLAR KNEE (MISCELLANEOUS) ×1 IMPLANT
PIN DRILL QUICK PACK ×2 IMPLANT
PIN FIXATION 1/8DIA X 3INL (PIN) ×2 IMPLANT
PULSAVAC PLUS IRRIG FAN TIP (DISPOSABLE) ×2
SOL .9 NS 3000ML IRR  AL (IV SOLUTION) ×1
SOL .9 NS 3000ML IRR UROMATIC (IV SOLUTION) ×1 IMPLANT
SOL PREP PVP 2OZ (MISCELLANEOUS) ×2
SOLUTION PREP PVP 2OZ (MISCELLANEOUS) ×1 IMPLANT
SPONGE DRAIN TRACH 4X4 STRL 2S (GAUZE/BANDAGES/DRESSINGS) ×2 IMPLANT
STAPLER SKIN PROX 35W (STAPLE) ×2 IMPLANT
STRAP TIBIA SHORT (MISCELLANEOUS) ×2 IMPLANT
SUCTION FRAZIER HANDLE 10FR (MISCELLANEOUS) ×2
SUCTION TUBE FRAZIER 10FR DISP (MISCELLANEOUS) ×2 IMPLANT
SUT VIC AB 0 CT1 36 (SUTURE) ×2 IMPLANT
SUT VIC AB 1 CT1 36 (SUTURE) ×4 IMPLANT
SUT VIC AB 2-0 CT2 27 (SUTURE) ×2 IMPLANT
SYR 20CC LL (SYRINGE) ×2 IMPLANT
SYR 30ML LL (SYRINGE) ×4 IMPLANT
TIP FAN IRRIG PULSAVAC PLUS (DISPOSABLE) ×1 IMPLANT
TOWEL OR 17X26 4PK STRL BLUE (TOWEL DISPOSABLE) ×2 IMPLANT
TOWER CARTRIDGE SMART MIX (DISPOSABLE) ×2 IMPLANT
TRAY FOLEY W/METER SILVER 16FR (SET/KITS/TRAYS/PACK) ×4 IMPLANT
WRAPON POLAR PAD KNEE (MISCELLANEOUS) ×2

## 2017-01-15 NOTE — Anesthesia Procedure Notes (Signed)
Spinal  Patient location during procedure: OR Start time: 01/15/2017 11:42 AM End time: 01/15/2017 11:49 AM Staffing Performed: resident/CRNA  Preanesthetic Checklist Completed: patient identified, site marked, surgical consent, pre-op evaluation, timeout performed, IV checked, risks and benefits discussed and monitors and equipment checked Spinal Block Patient position: sitting Prep: ChloraPrep Patient monitoring: heart rate, continuous pulse ox, blood pressure and cardiac monitor Approach: midline Location: L4-5 Injection technique: single-shot Needle Needle type: Introducer and Pencan  Needle gauge: 24 G Needle length: 9 cm Additional Notes Negative paresthesia. Negative blood return. Positive free-flowing CSF. Expiration date of kit checked and confirmed. Patient tolerated procedure well, without complications.

## 2017-01-15 NOTE — Discharge Instructions (Signed)
°  Instructions after Total Knee Replacement ° ° Joe Gee P. Latera Mclin, Jr., M.D.    ° Dept. of Orthopaedics & Sports Medicine ° [facility] ° [address] ° , [place]  [address] ° Phone: [phone]   Fax: [phone] ° °  °DIET: °• Drink plenty of non-alcoholic fluids. °• Resume your normal diet. Include foods high in fiber. ° °ACTIVITY:  °• You may use crutches or a walker with weight-bearing as tolerated, unless instructed otherwise. °• You may be weaned off of the walker or crutches by your Physical Therapist.  °• Do NOT place pillows under the knee. Anything placed under the knee could limit your ability to straighten the knee.   °• Continue doing gentle exercises. Exercising will reduce the pain and swelling, increase motion, and prevent muscle weakness.   °• Please continue to use the TED compression stockings for 6 weeks. You may remove the stockings at night, but should reapply them in the morning. °• Do not drive or operate any equipment until instructed. ° °WOUND CARE:  °• Continue to use the PolarCare or ice packs periodically to reduce pain and swelling. °• You may bathe or shower after the staples are removed at the first office visit following surgery. ° °MEDICATIONS: °• You may resume your regular medications. °• Please take the pain medication as prescribed on the medication. °• Do not take pain medication on an empty stomach. °• You have been given a prescription for a blood thinner (Lovenox or Coumadin). Please take the medication as instructed. (NOTE: After completing a 2 week course of Lovenox, take one Enteric-coated aspirin once a day. This along with elevation will help reduce the possibility of phlebitis in your operated leg.) °• Do not drive or drink alcoholic beverages when taking pain medications. ° °CALL THE OFFICE FOR: °• Temperature above 101 degrees °• Excessive bleeding or drainage on the dressing. °• Excessive swelling, coldness, or paleness of the toes. °• Persistent  nausea and vomiting. ° °FOLLOW-UP:  °• You should have an appointment to return to the office in 10-14 days after surgery. °• Arrangements have been made for continuation of Physical Therapy (either home therapy or outpatient therapy). °  °

## 2017-01-15 NOTE — Op Note (Signed)
OPERATIVE NOTE  DATE OF SURGERY:  01/15/2017  PATIENT NAME:  Craig [name]   DOB: Jun 18, 1945  MRN: [id number]  PRE-OPERATIVE DIAGNOSIS: Degenerative arthrosis of the left knee, primary  POST-OPERATIVE DIAGNOSIS:  Same  PROCEDURE:  Left total knee arthroplasty using computer-assisted navigation  SURGEON:  Marciano Sequin. M.D.  ASSISTANT:  Vance Peper, PA (present and scrubbed throughout the case, critical for assistance with exposure, retraction, instrumentation, and closure)  ANESTHESIA: spinal  ESTIMATED BLOOD LOSS: 100 mL  FLUIDS REPLACED: 1400 mL of crystalloid  TOURNIQUET TIME: 88 minutes  DRAINS: 2 medium Hemovac drains  SOFT TISSUE RELEASES: Anterior cruciate ligament, posterior cruciate ligament, deep medial collateral ligament, patellofemoral ligament  IMPLANTS UTILIZED: DePuy PFC Sigma size 6 posterior stabilized femoral component (cemented), size 5 MBT tibial component (cemented), 38 mm 3 peg oval dome patella (cemented), and a 12.5 mm stabilized rotating platform polyethylene insert.  INDICATIONS FOR SURGERY: Craig [name] is a 71 y.o. year old male with a long history of progressive knee pain. X-rays demonstrated severe degenerative changes in tricompartmental fashion. The patient had not seen any significant improvement despite conservative nonsurgical intervention. After discussion of the risks and benefits of surgical intervention, the patient expressed understanding of the risks benefits and agree with plans for total knee arthroplasty.   The risks, benefits, and alternatives were discussed at length including but not limited to the risks of infection, bleeding, nerve injury, stiffness, blood clots, the need for revision surgery, cardiopulmonary complications, among others, and they were willing to proceed.  PROCEDURE IN DETAIL: The patient was brought into the operating room and, after adequate spinal anesthesia was achieved, a tourniquet was placed on the  patient's upper thigh. The patient's knee and leg were cleaned and prepped with alcohol and DuraPrep and draped in the usual sterile fashion. A "timeout" was performed as per usual protocol. The lower extremity was exsanguinated using an Esmarch, and the tourniquet was inflated to 300 mmHg. An anterior longitudinal incision was made followed by a standard mid vastus approach. The deep fibers of the medial collateral ligament were elevated in a subperiosteal fashion off of the medial flare of the tibia so as to maintain a continuous soft tissue sleeve. The patella was subluxed laterally and the patellofemoral ligament was incised. Inspection of the knee demonstrated severe degenerative changes with full-thickness loss of articular cartilage. Osteophytes were debrided using a rongeur. Anterior and posterior cruciate ligaments were excised. Two 4.0 mm Schanz pins were inserted in the femur and into the tibia for attachment of the array of trackers used for computer-assisted navigation. Hip center was identified using a circumduction technique. Distal landmarks were mapped using the computer. The distal femur and proximal tibia were mapped using the computer. The distal femoral cutting guide was positioned using computer-assisted navigation so as to achieve a 5 distal valgus cut. The femur was sized and it was felt that a size 6 femoral component was appropriate. A size 6 femoral cutting guide was positioned and the anterior cut was performed and verified using the computer. This was followed by completion of the posterior and chamfer cuts. Femoral cutting guide for the central box was then positioned in the center box cut was performed.  Attention was then directed to the proximal tibia. Medial and lateral menisci were excised. The extramedullary tibial cutting guide was positioned using computer-assisted navigation so as to achieve a 0 varus-valgus alignment and 0 posterior slope. The cut was performed and  verified using the computer. The  proximal tibia was sized and it was felt that a size 5 tibial tray was appropriate. Tibial and femoral trials were inserted followed by insertion of a 12.5 mm polyethylene insert. This allowed for excellent mediolateral soft tissue balancing both in flexion and in full extension. Finally, the patella was cut and prepared so as to accommodate a 38 mm 3 peg oval dome patella. A patella trial was placed and the knee was placed through a range of motion with excellent patellar tracking appreciated. The femoral trial was removed after debridement of posterior osteophytes. The central post-hole for the tibial component was reamed followed by insertion of a keel punch. Tibial trials were then removed. Cut surfaces of bone were irrigated with copious amounts of normal saline with antibiotic solution using pulsatile lavage and then suctioned dry. Polymethylmethacrylate cement was prepared in the usual fashion using a vacuum mixer. Cement was applied to the cut surface of the proximal tibia as well as along the undersurface of a size 5 MBT tibial component. Tibial component was positioned and impacted into place. Excess cement was removed using [employer] fast streamer. Cement was then applied to the cut surfaces of the femur as well as along the posterior flanges of the size 6 femoral component. The femoral component was positioned and impacted into place. Excess cement was removed using [employer] fast streamer. A 12.5 mm polyethylene trial was inserted and the knee was brought into full extension with steady axial compression applied. Finally, cement was applied to the backside of a 38 mm 3 peg oval dome patella and the patellar component was positioned and patellar clamp applied. Excess cement was removed using [employer] fast streamer. After adequate curing of the cement, the tourniquet was deflated after a total tourniquet time of 88 minutes. Hemostasis was achieved using electrocautery. The knee was irrigated  with copious amounts of normal saline with antibiotic solution using pulsatile lavage and then suctioned dry. 20 mL of 1.3% Exparel and 60 mL of 0.25% Marcaine in 40 mL of normal saline was injected along the posterior capsule, medial and lateral gutters, and along the arthrotomy site. A 12.5 mm stabilized rotating platform polyethylene insert was inserted and the knee was placed through a range of motion with excellent mediolateral soft tissue balancing appreciated and excellent patellar tracking noted. 2 medium drains were placed in the wound bed and brought out through separate stab incisions. The medial parapatellar portion of the incision was reapproximated using interrupted sutures of #1 Vicryl. Subcutaneous tissue was approximated in layers using first #0 Vicryl followed #2-0 Vicryl. The skin was approximated with skin staples. A sterile dressing was applied.  The patient tolerated the procedure well and was transported to the recovery room in stable condition.    James P. Holley Bouche., M.D.

## 2017-01-15 NOTE — Transfer of Care (Signed)
Immediate Anesthesia Transfer of Care Note  Patient: Craig [name]  Procedure(s) Performed: COMPUTER ASSISTED TOTAL KNEE ARTHROPLASTY (Left Knee)  Patient Location: PACU  Anesthesia Type:Spinal  Level of Consciousness: drowsy and patient cooperative  Airway & Oxygen Therapy: Patient Spontanous Breathing and Patient connected to face mask oxygen  Post-op Assessment: Report given to RN and Post -op Vital signs reviewed and stable  Post vital signs: Reviewed and stable  Last Vitals:  Vitals:   01/15/17 1057  BP: (!) 176/100  Pulse: 74  Resp: 17  SpO2: 100%    Last Pain:  Vitals:   01/15/17 1057  TempSrc: Temporal         Complications: No apparent anesthesia complications

## 2017-01-15 NOTE — Progress Notes (Signed)
MD aware of elevated BP, administered more pain medication per protocol, recheck 1hour.

## 2017-01-15 NOTE — H&P (Signed)
The patient has been re-examined, and the chart reviewed, and there have been no interval changes to the documented history and physical.    The risks, benefits, and alternatives have been discussed at length. The patient expressed understanding of the risks benefits and agreed with plans for surgical intervention.  Hilde Churchman P. Sharra Cayabyab, Jr. M.D.    

## 2017-01-15 NOTE — Anesthesia Preprocedure Evaluation (Signed)
Anesthesia Evaluation  Patient identified by MRN, date of birth, ID band Patient awake    Reviewed: Allergy & Precautions, NPO status , Patient's Chart, lab work & pertinent test results, reviewed documented beta blocker date and time   Airway Mallampati: III  TM Distance: >3 FB     Dental  (+) Chipped, Missing, Partial Upper   Pulmonary COPD, former smoker,           Cardiovascular hypertension, Pt. 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 OK now. Nephrectomy. Eliquis off for 6 days.  Reproductive/Obstetrics                             Anesthesia Physical Anesthesia Plan  ASA: III  Anesthesia Plan: Spinal   Post-op Pain Management:    Induction:   PONV Risk Score and Plan:   Airway Management Planned:   Additional Equipment:   Intra-op Plan:   Post-operative Plan:   Informed Consent: I have reviewed the patients History and Physical, chart, labs and discussed the procedure including the risks, benefits and alternatives for the proposed anesthesia with the patient or authorized representative who has indicated his/her understanding and acceptance.     Plan Discussed with: CRNA  Anesthesia Plan Comments:         Anesthesia Quick Evaluation

## 2017-01-15 NOTE — Anesthesia Post-op Follow-up Note (Signed)
Anesthesia QCDR form completed.        

## 2017-01-15 NOTE — OR Nursing (Signed)
Per Dr.Brandon order attempted x 1 with 18 Fr.coude tip cath, no urine seen, assisted by Dr.Hooten.  Dr.Brandon notified. Foley placed by Dr.Brandon - 18 Fr.coude tip.

## 2017-01-16 ENCOUNTER — Encounter: Payer: Self-pay | Admitting: Orthopedic Surgery

## 2017-01-16 LAB — CBC
HEMATOCRIT: 32.3 % — AB (ref 40.0–52.0)
Hemoglobin: 11 g/dL — ABNORMAL LOW (ref 13.0–18.0)
MCH: 32.3 pg (ref 26.0–34.0)
MCHC: 34.1 g/dL (ref 32.0–36.0)
MCV: 94.7 fL (ref 80.0–100.0)
Platelets: 154 10*3/uL (ref 150–440)
RBC: 3.41 MIL/uL — ABNORMAL LOW (ref 4.40–5.90)
RDW: 14.3 % (ref 11.5–14.5)
WBC: 7.9 10*3/uL (ref 3.8–10.6)

## 2017-01-16 LAB — BASIC METABOLIC PANEL
ANION GAP: 8 (ref 5–15)
BUN: 16 mg/dL (ref 6–20)
CALCIUM: 8.1 mg/dL — AB (ref 8.9–10.3)
CO2: 24 mmol/L (ref 22–32)
CREATININE: 1.05 mg/dL (ref 0.61–1.24)
Chloride: 103 mmol/L (ref 101–111)
Glucose, Bld: 115 mg/dL — ABNORMAL HIGH (ref 65–99)
Potassium: 4.1 mmol/L (ref 3.5–5.1)
SODIUM: 135 mmol/L (ref 135–145)

## 2017-01-16 MED ORDER — TRAMADOL HCL 50 MG PO TABS: 50.0000 mg | ORAL_TABLET | ORAL | 0 refills | Status: DC | PRN

## 2017-01-16 MED ORDER — OXYCODONE HCL 5 MG PO TABS: 5.0000 mg | ORAL_TABLET | ORAL | 0 refills | Status: DC | PRN

## 2017-01-16 NOTE — Progress Notes (Signed)
Clinical Social Worker (CSW) received SNF consult. PT is recommending home health. RN case manager aware of above. Please reconsult if future social work needs arise. CSW signing off.   Janeene Sand, LCSW [phone] 

## 2017-01-16 NOTE — Anesthesia Postprocedure Evaluation (Signed)
Anesthesia Post Note  Patient: Craig [name]  Procedure(s) Performed: COMPUTER ASSISTED TOTAL KNEE ARTHROPLASTY (Left Knee)  Patient location during evaluation: Nursing Unit Anesthesia Type: Spinal Level of consciousness: awake, awake and alert and oriented Pain management: pain level controlled Vital Signs Assessment: post-procedure vital signs reviewed and stable Respiratory status: spontaneous breathing Cardiovascular status: blood pressure returned to baseline Postop Assessment: no headache, no backache, no apparent nausea or vomiting and adequate PO intake Anesthetic complications: no     Last Vitals:  Vitals:   01/16/17 0426 01/16/17 0741  BP: 110/69 128/73  Pulse: 75 64  Resp: 18 18  Temp: 36.7 C 36.7 C  SpO2: 96% 99%    Last Pain:  Vitals:   01/16/17 0741  TempSrc: Oral  PainSc: [place]

## 2017-01-16 NOTE — Progress Notes (Signed)
OT Cancellation Note  Patient Details Name: Craig [name] MRN: [id number] DOB: [birth year]   Cancelled Treatment:    Reason Eval/Treat Not Completed: OT screened, no needs identified, will sign off. Order received, chart reviewed. Upon attempt, pt in recliner with visitor in room. Verbalizes knowledge of compression stocking mgt, polar care mgt, AE/DME for bathing/dressing/toileting needs with good home set up and equipment from previous R TKA approx 3 years ago and recent therapy received at [facility]. Pt declines additional OT needs at this time. Will sign off. Please re-consult if additional needs arise.   Jeni Salles, MPH, MS, OTR/L ascom [phone] 01/16/17, 11:30 AM

## 2017-01-16 NOTE — Care Management Note (Signed)
Case Management Note  Patient Details  Name: Craig [name] MRN: [id number] Date of Birth: 1945/07/07  Subjective/Objective:  POD # 1 left TKA. Met with patient and his wife at bedside. Offered choice of home health agencies. [facility]. He used [facility] with his last [facility]. He has a walker, cane and bsc. He is on Eliquis chronically.                   Action/Plan: [facility] for HHPT. IT is anticipated that patient will discharge home tomorrow.   Expected Discharge Date:  01/17/17               Expected Discharge Plan:  [place]  In-House Referral:     Discharge planning Services  CM Consult  Post Acute Care Choice:  Home Health Choice offered to:  Patient, Spouse  DME Arranged:    DME Agency:     HH Arranged:  PT [place]:  [place]  Status of Service:  In process, will continue to follow  If discussed at Long Length of Stay Meetings, dates discussed:    Additional Comments:  Jolly Mango, RN 01/16/2017, 2:28 PM

## 2017-01-16 NOTE — Progress Notes (Signed)
Physical Therapy Treatment Patient Details Name: JUVENCIO VERDI MRN: [id number] DOB: 1945/03/01 Today's Date: 01/16/2017    History of Present Illness Pt is a 71 y/o M s/p L TKA.  Pt's PMH includes R TKA, cancer.    PT Comments    Mr. Wadlow completed therapeutic exercises but was unable to ambulate past 3 ft due to severe pain in L knee, with multiple attempts.  Will attempt to see pt later today for follow up session to attempt further ambulation. Pt will benefit from continued skilled PT services to increase functional independence and safety.    Follow Up Recommendations  Home health PT     Equipment Recommendations  None recommended by PT    Recommendations for Other Services       Precautions / Restrictions Precautions Precautions: Fall;Knee Precaution Booklet Issued: Yes (comment) Precaution Comments: Reminded pt of no pillow under knee Required Braces or Orthoses: Knee Immobilizer - Left Knee Immobilizer - Left: Other (comment)("If unable to perform straight leg raise") Restrictions Weight Bearing Restrictions: Yes LLE Weight Bearing: Weight bearing as tolerated    Mobility  Bed Mobility               General bed mobility comments: Pt sitting in chair at start and end of session  Transfers Overall transfer level: Needs assistance Equipment used: Rolling walker (2 wheeled) Transfers: Sit to/from Stand Sit to Stand: Min guard         General transfer comment: Close min guard as pt demonstrates instability and stands slowly due to severe pain.  Pt reaching out for windowsill instead of RW when standing.  Pt stood from chair x3.   Ambulation/Gait Ambulation/Gait assistance: Min guard Ambulation Distance (Feet): 5 Feet(2,3) Assistive device: Rolling walker (2 wheeled) Gait Pattern/deviations: Step-to pattern;Decreased stride length;Decreased stance time - left;Decreased step length - right;Decreased weight shift to left;Antalgic Gait velocity:  decreased   General Gait Details: Pt significantly WBing through RW with significantly decreased stance time on LLE due to pain. Pt unable to ambulate farther than 3 ft at a time due to pain and feels dizzy after ambulating this far.  Notified RN of pt's pain level and deferred pain for a later time when pt's pain is better under control.    Stairs            Wheelchair Mobility    Modified Rankin (Stroke Patients Only)       Balance Overall balance assessment: Needs assistance Sitting-balance support: No upper extremity supported;Feet supported Sitting balance-Leahy Scale: Good     Standing balance support: During functional activity;Bilateral upper extremity supported Standing balance-Leahy Scale: Poor Standing balance comment: Relies on UE support for static and dynamic activities                            Cognition Arousal/Alertness: Awake/alert Behavior During Therapy: WFL for tasks assessed/performed Overall Cognitive Status: Within Functional Limits for tasks assessed                                        Exercises Total Joint Exercises Ankle Circles/Pumps: AROM;Both;10 reps;Seated Quad Sets: Strengthening;Both;10 reps;Seated Straight Leg Raises: Strengthening;Left;10 reps;AAROM;Seated Knee Flexion: AAROM;Left;5 reps;Seated;Other (comment)(with 5 second holds) Goniometric ROM: 11 to 74 deg    General Comments        Pertinent Vitals/Pain Pain Assessment: Faces Faces Pain Scale:  Hurts worst Pain Location: L knee Pain Descriptors / Indicators: Aching;Grimacing;Guarding Pain Intervention(s): Limited activity within patient's tolerance;Monitored during session;Repositioned;Ice applied;Patient requesting pain meds-RN notified    Home Living                      Prior Function            PT Goals (current goals can now be found in the care plan section) Acute Rehab PT Goals Patient Stated Goal: to go home and  return to PLOF PT Goal Formulation: With patient Time For Goal Achievement: 01/30/17 Potential to Achieve Goals: Good Progress towards PT goals: Not progressing toward goals - comment(due to pain)    Frequency    BID      PT Plan Current plan remains appropriate    Co-evaluation              AM-PAC PT "6 Clicks" Daily Activity  Outcome Measure  Difficulty turning over in bed (including adjusting bedclothes, sheets and blankets)?: A Little Difficulty moving from lying on back to sitting on the side of the bed? : Unable Difficulty sitting down on and standing up from a chair with arms (e.g., wheelchair, bedside commode, etc,.)?: A Lot Help needed moving to and from a bed to chair (including a wheelchair)?: A Little Help needed walking in hospital room?: A Little Help needed climbing 3-5 steps with a railing? : A Little 6 Click Score: 15    End of Session Equipment Utilized During Treatment: Gait belt Activity Tolerance: Patient limited by pain Patient left: in chair;with call bell/phone within reach;with chair alarm set;Other (comment)(with polar care) Nurse Communication: Mobility status;Patient requests pain meds PT Visit Diagnosis: Pain;Unsteadiness on feet (R26.81);Other abnormalities of gait and mobility (R26.89);Muscle weakness (generalized) (M62.81) Pain - Right/Left: Left Pain - part of body: Knee     Time: 8889-1694 PT Time Calculation (min) (ACUTE ONLY): 19 min  Charges:  $Therapeutic Exercise: 8-22 mins                    G Codes:       Collie Siad PT, DPT 01/16/2017, 4:33 PM

## 2017-01-16 NOTE — Progress Notes (Signed)
Patient dangled. Tolerated well.

## 2017-01-16 NOTE — Progress Notes (Signed)
   Subjective: 1 Day Post-Op Procedure(s) (LRB): COMPUTER ASSISTED TOTAL KNEE ARTHROPLASTY (Left) Patient reports pain as moderate.   Patient is well, and has had no acute complaints or problems We will start therapy today.  Plan is to go Home after hospital stay. no nausea and no vomiting Patient denies any chest pains or shortness of breath. Was noted to have hypertension following surgery but has been corrected Objective: Vital signs in last 24 hours: Temp:  [96.9 F (36.1 C)-99 F (37.2 C)] 98 F (36.7 C) (11/29 0741) Pulse Rate:  [60-79] 64 (11/29 0741) Resp:  [3-20] 18 (11/29 0741) BP: (110-176)/(64-107) 128/73 (11/29 0741) SpO2:  [96 %-100 %] 99 % (11/29 0741) Weight:  [104.3 kg (230 lb)] 104.3 kg (230 lb) (11/28 1057) Heels are non tender and elevated off the bed using rolled towels along with bone foam under operative leg Intake/Output from previous day: 11/28 0701 - 11/29 0700 In: 2425 [P.O.:30; I.V.:2395] Out: 1970 [Urine:1750; Drains:120; Blood:100] Intake/Output this shift: No intake/output data recorded.  Recent Labs    01/16/17 0324  HGB 11.0*   Recent Labs    01/16/17 0324  WBC 7.9  RBC 3.41*  HCT 32.3*  PLT 154   Recent Labs    01/16/17 0324  NA 135  K 4.1  CL 103  CO2 24  BUN 16  CREATININE 1.05  GLUCOSE 115*  CALCIUM 8.1*   No results for input(s): LABPT, INR in the last 72 hours.  EXAM General - Patient is Alert, Appropriate and Oriented Extremity - Neurologically intact Neurovascular intact Sensation intact distally Intact pulses distally Dorsiflexion/Plantar flexion intact Compartment soft Dressing - dressing C/D/I Motor Function - intact, moving foot and toes well on exam. Able to do straight leg raise on his own  Past Medical History:  Diagnosis Date  . Anginal pain ([place])   . BPH (benign prostatic hyperplasia)   . CAD (coronary artery disease)   . Cancer ([place])    skin cancer   . Chronic kidney disease    left kidney  removed  . GERD (gastroesophageal reflux disease)   . History of hiatal hernia   . Hypercholesterolemia   . Hypertension     Assessment/Plan: 1 Day Post-Op Procedure(s) (LRB): COMPUTER ASSISTED TOTAL KNEE ARTHROPLASTY (Left) Active Problems:   S/P total knee arthroplasty  Estimated body mass index is 32.08 kg/m as calculated from the following:   Height as of this encounter: 5\' 11"  (1.803 m).   Weight as of this encounter: 104.3 kg (230 lb). Advance diet Up with therapy D/C IV fluids Plan for discharge tomorrow Discharge home with home health  Labs: Were reviewed and acceptable DVT Prophylaxis - Foot Pumps, TED hose and eliquis Weight-Bearing as tolerated to left leg D/C O2 and Pulse OX and try on Room Air Begin working on bowel movement Labs tomorrow morning  Daylah Sayavong R. Elaine [place] 01/16/2017, 7:53 AM

## 2017-01-16 NOTE — Discharge Summary (Signed)
Physician Discharge Summary  Patient ID: Craig [name] MRN: [id number] DOB/AGE: [birth year] 71 y.o.  Admit date: 01/15/2017 Discharge date: 01/17/2017 Admission Diagnoses:  PRIMARY OSTEOARTHRITIS OF LEFT KNEE   Discharge Diagnoses: Patient Active Problem List   Diagnosis Date Noted  . S/P total knee arthroplasty 01/15/2017  . Symptomatic bradycardia 12/06/2016  . History of stroke 08/27/2016  . Low iron stores 07/08/2016  . Anemia of chronic disease 07/08/2016  . CVA (cerebral vascular accident) ([place]) 07/06/2016  . Postoperative anemia due to acute blood loss 07/03/2016  . S/P coronary artery bypass graft x 3 07/03/2016  . S/P TURP 07/03/2016  . Urethral false passage 07/03/2016  . Coronary artery disease involving native coronary artery 07/02/2016  . Schatzki's ring 07/01/2016  . COPD (chronic obstructive pulmonary disease) ([place]) 07/01/2016  . Hydronephrosis, left 11/07/2015  . Sepsis ([place]) 09/29/2015  . Acute pyelonephritis 09/25/2015  . Frequency of urination 05/29/2015  . BPH (benign prostatic hyperplasia) 09/05/2014  . Hyperlipidemia 09/05/2014  . Enlarged prostate with urinary obstruction 07/03/2014  . History of total knee replacement 06/02/2014  . Status post total right knee replacement 06/02/2014  . Osteoarthritis of knee 05/12/2014  . HTN (hypertension) 06/28/2013  . Hypertension 06/28/2013    Past Medical History:  Diagnosis Date  . Anginal pain ([place])   . BPH (benign prostatic hyperplasia)   . CAD (coronary artery disease)   . Cancer ([place])    skin cancer   . Chronic kidney disease    left kidney removed  . GERD (gastroesophageal reflux disease)   . History of hiatal hernia   . Hypercholesterolemia   . Hypertension      Transfusion: No transfusions during this admission   Consultants (if any):   Discharged Condition: Improved  Hospital Course: Craig [name] is an 71 y.o. male who was admitted 01/15/2017 with a diagnosis of degenerative  arthrosis left knee and went to the operating room on 01/15/2017 and underwent the above named procedures.    Surgeries:Procedure(s): COMPUTER ASSISTED TOTAL KNEE ARTHROPLASTY on 01/15/2017  PRE-OPERATIVE DIAGNOSIS: Degenerative arthrosis of the left knee, primary  POST-OPERATIVE DIAGNOSIS:  Same  PROCEDURE:  Left total knee arthroplasty using computer-assisted navigation  SURGEON:  Marciano Sequin. M.D.  ASSISTANT:  Vance Peper, PA (present and scrubbed throughout the case, critical for assistance with exposure, retraction, instrumentation, and closure)  ANESTHESIA: spinal  ESTIMATED BLOOD LOSS: 100 mL  FLUIDS REPLACED: 1400 mL of crystalloid  TOURNIQUET TIME: 88 minutes  DRAINS: 2 medium Hemovac drains  SOFT TISSUE RELEASES: Anterior cruciate ligament, posterior cruciate ligament, deep medial collateral ligament, patellofemoral ligament  IMPLANTS UTILIZED: DePuy PFC Sigma size 6 posterior stabilized femoral component (cemented), size 5 MBT tibial component (cemented), 38 mm 3 peg oval dome patella (cemented), and a 12.5 mm stabilized rotating platform polyethylene insert.  INDICATIONS FOR SURGERY: Craig [name] is a 71 y.o. year old male with a long history of progressive knee pain. X-rays demonstrated severe degenerative changes in tricompartmental fashion. The patient had not seen any significant improvement despite conservative nonsurgical intervention. After discussion of the risks and benefits of surgical intervention, the patient expressed understanding of the risks benefits and agree with plans for total knee arthroplasty.   The risks, benefits, and alternatives were discussed at length including but not limited to the risks of infection, bleeding, nerve injury, stiffness, blood clots, the need for revision surgery, cardiopulmonary complications, among others, and they were willing to proceed.    Patient tolerated the surgery well.  No complications  .Patient was taken to PACU where she was stabilized and then transferred to the orthopedic floor.  Patient started on Eliquis 5 mg every 24 hours Foot pumps applied bilaterally at 80 mm hgb. Heels elevated off bed with rolled towels. No evidence of DVT. Calves non tender. Negative Homan. Physical therapy started on day #1 for gait training and transfer with OT starting on  day #1 for ADL and assisted devices. Patient has done well with therapy. Ambulated greater than 200 feet upon being discharged. Was able to ascend and descend 4 steps safely and independently  Patient's IV And Foley were discontinued on day #1 with Hemovac being discontinued on day #2. Dressing was changed on day 2 prior to patient being discharged   He was given perioperative antibiotics:  Anti-infectives (From admission, onward)   Start     Dose/Rate Route Frequency Ordered Stop   01/15/17 1800  ceFAZolin (ANCEF) 2 g in dextrose 5 % 100 mL IVPB     2 g 200 mL/hr over 30 Minutes Intravenous Every 6 hours 01/15/17 1744 01/16/17 1759   01/15/17 1715  ceFAZolin (ANCEF) IVPB 2g/100 mL premix  Status:  Discontinued     2 g 200 mL/hr over 30 Minutes Intravenous Every 6 hours 01/15/17 1711 01/15/17 1743   01/15/17 1045  ceFAZolin (ANCEF) 2-4 GM/100ML-% IVPB    Comments:  Phineas Real   : cabinet override      01/15/17 1045 01/15/17 1210   01/15/17 0600  ceFAZolin (ANCEF) IVPB 2g/100 mL premix     2 g 200 mL/hr over 30 Minutes Intravenous On call to O.R. 01/14/17 2303 01/15/17 1220    .  He was fitted with AV 1 compression foot pump devices, instructed on heel pumps, early ambulation, and fitted with TED stockings bilaterally for DVT prophylaxis.  He benefited maximally from the hospital stay and there were no complications.    Recent vital signs:  Vitals:   01/16/17 0426 01/16/17 0741  BP: 110/69 128/73  Pulse: 75 64  Resp: 18 18  Temp: 98.1 F (36.7 C) 98 F (36.7 C)  SpO2: 96% 99%    Recent laboratory  studies:  Lab Results  Component Value Date   HGB 11.0 (L) 01/16/2017   HGB 12.8 (L) 01/02/2017   HGB 13.2 12/06/2016   Lab Results  Component Value Date   WBC 7.9 01/16/2017   PLT 154 01/16/2017   Lab Results  Component Value Date   INR 0.97 01/02/2017   Lab Results  Component Value Date   NA 135 01/16/2017   K 4.1 01/16/2017   CL 103 01/16/2017   CO2 24 01/16/2017   BUN 16 01/16/2017   CREATININE 1.05 01/16/2017   GLUCOSE 115 (H) 01/16/2017    Discharge Medications:   Allergies as of 01/17/2017      Reactions   Peanut Oil Other (See Comments)   Mouth ulcers   Peanut-containing Drug Products Other (See Comments)   Mouth ulcers       Medication List    TAKE these medications   acetaminophen 500 MG tablet Commonly known as:  TYLENOL Take 500 mg every 6 (six) hours as needed by mouth for moderate pain or headache.   atorvastatin 80 MG tablet Commonly known as:  LIPITOR Take 80 mg by mouth at bedtime.   cetirizine 10 MG tablet Commonly known as:  ZYRTEC Take 10 mg by mouth daily.   docusate sodium 100 MG capsule Commonly known as:  COLACE Take 100 mg 2 (two) times daily by mouth.   ELIQUIS 5 MG Tabs tablet Generic drug:  apixaban Take 5 mg 2 (two) times daily by mouth.   ferrous sulfate 325 (65 FE) MG tablet Take 325 mg by mouth daily with breakfast.   finasteride 5 MG tablet Commonly known as:  PROSCAR Take 5 mg at bedtime by mouth.   FLUoxetine 20 MG capsule Commonly known as:  PROZAC Take 20 mg by mouth daily.   fluticasone 50 MCG/ACT nasal spray Commonly known as:  FLONASE Place 2 sprays daily into both nostrils.   gabapentin 100 MG capsule Commonly known as:  NEURONTIN Take 100 mg 2 (two) times daily by mouth.   multivitamin tablet Take 1 tablet by mouth daily.   omeprazole 40 MG capsule Commonly known as:  PRILOSEC Take 40 mg by mouth 2 (two) times daily.   oxyCODONE 5 MG immediate release tablet Commonly known as:  Oxy  IR/ROXICODONE Take 1 tablet (5 mg total) by mouth every 3 (three) hours as needed for moderate pain ((score 4 to 6)).   tamsulosin 0.4 MG Caps capsule Commonly known as:  FLOMAX Take 0.4 mg at bedtime by mouth.   traMADol 50 MG tablet Commonly known as:  ULTRAM Take 50 mg by mouth every 8 (eight) hours as needed for moderate pain. What changed:  Another medication with the same name was added. Make sure you understand how and when to take each.   traMADol 50 MG tablet Commonly known as:  ULTRAM Take 1-2 tablets (50-100 mg total) by mouth every 4 (four) hours as needed for moderate pain. What changed:  You were already taking a medication with the same name, and this prescription was added. Make sure you understand how and when to take each.   traZODone 50 MG tablet Commonly known as:  DESYREL Take 50 mg by mouth at bedtime.   vitamin E 400 UNIT capsule Take 800 Units daily by mouth.            Durable Medical Equipment  (From admission, onward)        Start     Ordered   01/15/17 1712  DME Walker rolling  Once    Question:  Patient needs a walker to treat with the following condition  Answer:  Total knee replacement status   01/15/17 1711   01/15/17 1712  DME Bedside commode  Once    Question:  Patient needs a bedside commode to treat with the following condition  Answer:  Total knee replacement status   01/15/17 1711      Diagnostic Studies: Dg Knee Left Port  Result Date: 01/15/2017 CLINICAL DATA:  S/p TKR EXAM: PORTABLE LEFT KNEE - 1-2 VIEW COMPARISON:  MRI 02/07/2010 FINDINGS: Status post total knee arthroplasty. Surgical clips overlie the anterior aspect of the knee. A drain is in place in the suprapatellar bursa. No evidence for dislocation or interval fracture. There is atherosclerotic calcification of popliteal artery. IMPRESSION: No adverse features following total knee arthroplasty. Electronically Signed   By: Nolon Nations M.D.   On: 01/15/2017 15:59     Disposition: 01-Home or Self Care  Discharge Instructions    Diet - low sodium heart healthy   Complete by:  As directed    Increase activity slowly   Complete by:  As directed       Follow-up Information    Watt Climes, PA On 01/30/2017.   Specialty:  Physician Assistant Why:  at 1:45pm Contact information: [place] [address] [phone]        Dereck Leep, MD On 02/27/2017.   Specialty:  Orthopedic Surgery Why:  at 10:45am Contact information: [place] [address] [phone]            Signed: Watt Climes. 01/16/2017, 8:01 AM

## 2017-01-16 NOTE — Evaluation (Signed)
Physical Therapy Evaluation Patient Details Name: Craig [name] MRN: [id number] DOB: Nov 12, 1945 Today's Date: 01/16/2017   History of Present Illness  Pt is a 71 y/o M s/p L TKA.  Pt's PMH includes R TKA, cancer.  Clinical Impression  Pt is s/p L TKA resulting in the deficits listed below (see PT Problem List). Craig [name] was Ind PTA ambulating without AD and independent with ADLs.  He currently requires min guard assist for transfers and ambulation and cues for safe and proper technique. Pt will benefit from skilled PT to increase their independence and safety with mobility to allow discharge to the venue listed below.     Follow Up Recommendations Home health PT    Equipment Recommendations  None recommended by PT    Recommendations for Other Services       Precautions / Restrictions Precautions Precautions: Fall;Knee Precaution Booklet Issued: Yes (comment) Precaution Comments: Instructed pt in no pillow under knee Required Braces or Orthoses: Knee Immobilizer - Left Knee Immobilizer - Left: Other (comment)("If unable to perform straight leg raise") Restrictions Weight Bearing Restrictions: Yes LLE Weight Bearing: Weight bearing as tolerated      Mobility  Bed Mobility Overal bed mobility: Needs Assistance Bed Mobility: Supine to Sit     Supine to sit: Supervision;HOB elevated     General bed mobility comments: Increased time and effort with use of bed rail.  No physical assist required.   Transfers Overall transfer level: Needs assistance Equipment used: Rolling walker (2 wheeled) Transfers: Sit to/from Stand Sit to Stand: Min guard         General transfer comment: Cues for proper hand placement and safe technique.  Pt is slow to rise but is steady.  Min guard provided for safety.  Pt with poorly controlled descent to sit.   Ambulation/Gait Ambulation/Gait assistance: Min guard Ambulation Distance (Feet): 90 Feet Assistive device: Rolling walker (2  wheeled) Gait Pattern/deviations: Step-to pattern;Decreased stride length;Decreased stance time - left;Decreased step length - right;Decreased weight shift to left;Antalgic Gait velocity: decreased Gait velocity interpretation: Below normal speed for age/gender General Gait Details: Pt WBing heavily through BUEs on RW due to L knee pain.  Cues for proper sequencing using RW.  Close min guard for safety.  Ambulatory distance limited by 9/10 pain this session.    Stairs            Wheelchair Mobility    Modified Rankin (Stroke Patients Only)       Balance Overall balance assessment: Needs assistance Sitting-balance support: No upper extremity supported;Feet supported Sitting balance-Leahy Scale: Good     Standing balance support: During functional activity;Bilateral upper extremity supported Standing balance-Leahy Scale: Poor Standing balance comment: Relies on UE support for static and dynamic activities                             Pertinent Vitals/Pain Pain Assessment: 0-10 Pain Score: 9  Pain Location: L knee Pain Descriptors / Indicators: Aching;Grimacing;Guarding Pain Intervention(s): Limited activity within patient's tolerance;Monitored during session;Repositioned;Ice applied    Home Living Family/patient expects to be discharged to:: Private residence Living Arrangements: Spouse/significant other Available Help at Discharge: Family;Available 24 hours/day Type of Home: House Home Access: Stairs to enter Entrance Stairs-Rails: Left;Right;Can reach both Entrance Stairs-Number of Steps: 4 Home Layout: One level Home Equipment: Walker - 2 wheels;Bedside commode;Grab bars - tub/shower;Shower seat;Cane - single point;Cane - quad      Prior Function Level of  Independence: Independent         Comments: Pt ambulating without AD.  No falls in the past 6 months.  Ind with ADLs.      Hand Dominance        Extremity/Trunk Assessment   Upper  Extremity Assessment Upper Extremity Assessment: Overall WFL for tasks assessed    Lower Extremity Assessment Lower Extremity Assessment: LLE deficits/detail LLE Deficits / Details: Unable to formally assess but pt able to perform LAQ and SLR without assist LLE: Unable to fully assess due to pain       Communication   Communication: No difficulties  Cognition Arousal/Alertness: Awake/alert Behavior During Therapy: WFL for tasks assessed/performed Overall Cognitive Status: Within Functional Limits for tasks assessed                                        General Comments      Exercises Total Joint Exercises Ankle Circles/Pumps: AROM;Both;10 reps;Supine Quad Sets: Strengthening;Both;10 reps;Supine Straight Leg Raises: Strengthening;Left;10 reps;Supine Long Arc Quad: Strengthening;Left;10 reps;Seated Knee Flexion: AAROM;Left;5 reps;Seated;Other (comment)(with 5 second holds) Goniometric ROM: 12-78 deg   Assessment/Plan    PT Assessment Patient needs continued PT services  PT Problem List Decreased strength;Decreased range of motion;Decreased activity tolerance;Decreased mobility;Decreased balance;Decreased knowledge of use of DME;Decreased safety awareness;Decreased knowledge of precautions;Pain       PT Treatment Interventions Gait training;DME instruction;Stair training;Functional mobility training;Therapeutic activities;Therapeutic exercise;Balance training;Neuromuscular re-education;Patient/family education;Modalities    PT Goals (Current goals can be found in the Care Plan section)  Acute Rehab PT Goals Patient Stated Goal: to go home and return to PLOF PT Goal Formulation: With patient Time For Goal Achievement: 01/30/17 Potential to Achieve Goals: Good    Frequency BID   Barriers to discharge        Co-evaluation               AM-PAC PT "6 Clicks" Daily Activity  Outcome Measure Difficulty turning over in bed (including adjusting  bedclothes, sheets and blankets)?: A Little Difficulty moving from lying on back to sitting on the side of the bed? : Unable Difficulty sitting down on and standing up from a chair with arms (e.g., wheelchair, bedside commode, etc,.)?: A Lot Help needed moving to and from a bed to chair (including a wheelchair)?: A Little Help needed walking in hospital room?: A Little Help needed climbing 3-5 steps with a railing? : A Little 6 Click Score: 15    End of Session Equipment Utilized During Treatment: Gait belt Activity Tolerance: Patient tolerated treatment well;Patient limited by pain Patient left: in chair;with call bell/phone within reach;with chair alarm set;with family/visitor present Nurse Communication: Mobility status PT Visit Diagnosis: Pain;Unsteadiness on feet (R26.81);Other abnormalities of gait and mobility (R26.89);Muscle weakness (generalized) (M62.81) Pain - Right/Left: Left Pain - part of body: Knee    Time: 1003-1031 PT Time Calculation (min) (ACUTE ONLY): 28 min   Charges:   PT Evaluation $PT Eval Low Complexity: 1 Low PT Treatments $Therapeutic Exercise: 8-22 mins   PT G Codes:   PT G-Codes **NOT FOR INPATIENT CLASS** Functional Assessment Tool Used: AM-PAC 6 Clicks Basic Mobility;Clinical judgement Functional Limitation: Mobility: Walking and moving around Mobility: Walking and Moving Around Current Status ([id number]): At least 40 percent but less than 60 percent impaired, limited or restricted Mobility: Walking and Moving Around Goal Status [phone]): At least 1 percent but less than 20 percent impaired,  limited or restricted    Collie Siad PT, DPT 01/16/2017, 11:42 AM

## 2017-01-16 NOTE — Progress Notes (Signed)
Physical Therapy Treatment Patient Details Name: Craig [name] MRN: [id number] DOB: 1945-10-06 Today's Date: 01/16/2017    History of Present Illness Pt is a 71 y/o M s/p L TKA.  Pt's PMH includes R TKA, cancer.    PT Comments    [name] made good progress this session after receiving pain medication.  He ambulated 140 ft with RW and several standing rest breaks.  Will complete stair training next date.  Pt will benefit from continued skilled PT services to increase functional independence and safety.    Follow Up Recommendations  Home health PT     Equipment Recommendations  None recommended by PT    Recommendations for Other Services       Precautions / Restrictions Precautions Precautions: Fall;Knee Precaution Booklet Issued: Yes (comment) Precaution Comments: Reminded pt of no pillow under knee Required Braces or Orthoses: Knee Immobilizer - Left Knee Immobilizer - Left: Other (comment)("If unable to perform straight leg raise") Restrictions Weight Bearing Restrictions: Yes LLE Weight Bearing: Weight bearing as tolerated    Mobility  Bed Mobility Overal bed mobility: Needs Assistance Bed Mobility: Sit to Supine       Sit to supine: Min guard   General bed mobility comments: Pt requires increased time and effort but does not require physical assist or cues.  Transfers Overall transfer level: Needs assistance Equipment used: Rolling walker (2 wheeled) Transfers: Sit to/from Stand Sit to Stand: Min guard         General transfer comment: Pt slow to stand and requires min guard due to mild instability.  Pt with poorly controlled descent to sit and requires cues for proper hand placement with stand>sit.   Ambulation/Gait Ambulation/Gait assistance: Min guard Ambulation Distance (Feet): 140 Feet Assistive device: Rolling walker (2 wheeled) Gait Pattern/deviations: Step-to pattern;Decreased stride length;Decreased stance time - left;Decreased step  length - right;Decreased weight shift to left;Antalgic;Step-through pattern Gait velocity: decreased Gait velocity interpretation: Below normal speed for age/gender General Gait Details: Pt requires several standing rest breaks due to pain but demonstrates slow but steady gait.  Cues for upright posture and forward gaze.     Stairs            Wheelchair Mobility    Modified Rankin (Stroke Patients Only)       Balance Overall balance assessment: Needs assistance Sitting-balance support: No upper extremity supported;Feet supported Sitting balance-Leahy Scale: Good     Standing balance support: During functional activity;Bilateral upper extremity supported Standing balance-Leahy Scale: Fair Standing balance comment: Pt able to stand statically without UE support but relies on UE support for dynamic activities                            Cognition Arousal/Alertness: Awake/alert Behavior During Therapy: WFL for tasks assessed/performed Overall Cognitive Status: Within Functional Limits for tasks assessed                                        Exercises Total Joint Exercises Ankle Circles/Pumps: AROM;Both;10 reps;Seated Quad Sets: Strengthening;Both;10 reps;Seated Straight Leg Raises: Strengthening;Left;10 reps;AAROM;Seated Knee Flexion: AAROM;Left;5 reps;Seated;Other (comment)(with 5 second holds) Goniometric ROM: 11 to 74 deg    General Comments        Pertinent Vitals/Pain Pain Assessment: Faces Faces Pain Scale: Hurts even more Pain Location: L knee Pain Descriptors / Indicators: Aching;Grimacing;Guarding Pain Intervention(s): Limited activity  within patient's tolerance;Monitored during session;Repositioned;Premedicated before session;Ice applied    Home Living                      Prior Function            PT Goals (current goals can now be found in the care plan section) Acute Rehab PT Goals Patient Stated Goal: to go  home and return to PLOF PT Goal Formulation: With patient Time For Goal Achievement: 01/30/17 Potential to Achieve Goals: Good Progress towards PT goals: Progressing toward goals    Frequency    BID      PT Plan Current plan remains appropriate    Co-evaluation              AM-PAC PT "6 Clicks" Daily Activity  Outcome Measure  Difficulty turning over in bed (including adjusting bedclothes, sheets and blankets)?: A Little Difficulty moving from lying on back to sitting on the side of the bed? : Unable Difficulty sitting down on and standing up from a chair with arms (e.g., wheelchair, bedside commode, etc,.)?: A Lot Help needed moving to and from a bed to chair (including a wheelchair)?: A Little Help needed walking in hospital room?: A Little Help needed climbing 3-5 steps with a railing? : A Little 6 Click Score: 15    End of Session Equipment Utilized During Treatment: Gait belt Activity Tolerance: Patient limited by pain;Patient tolerated treatment well Patient left: with call bell/phone within reach;Other (comment);in bed;with bed alarm set;with family/visitor present(with polar care and bone foam) Nurse Communication: Mobility status PT Visit Diagnosis: Pain;Unsteadiness on feet (R26.81);Other abnormalities of gait and mobility (R26.89);Muscle weakness (generalized) (M62.81) Pain - Right/Left: Left Pain - part of body: Knee     Time: 9476-5465 PT Time Calculation (min) (ACUTE ONLY): 19 min  Charges:  $Gait Training: 8-22 mins $Therapeutic Exercise: 8-22 mins                    G Codes:       Collie Siad PT, DPT 01/16/2017, 4:47 PM

## 2017-01-17 LAB — BASIC METABOLIC PANEL
Anion gap: 5 (ref 5–15)
BUN: 15 mg/dL (ref 6–20)
CO2: 27 mmol/L (ref 22–32)
CREATININE: 1.07 mg/dL (ref 0.61–1.24)
Calcium: 8.3 mg/dL — ABNORMAL LOW (ref 8.9–10.3)
Chloride: 104 mmol/L (ref 101–111)
GFR calc Af Amer: >60 mL/min (ref >60)
GLUCOSE: 150 mg/dL — AB (ref 65–99)
POTASSIUM: 4 mmol/L (ref 3.5–5.1)
SODIUM: 136 mmol/L (ref 135–145)

## 2017-01-17 LAB — CBC
HCT: 32.7 % — ABNORMAL LOW (ref 40.0–52.0)
Hemoglobin: 11 g/dL — ABNORMAL LOW (ref 13.0–18.0)
MCH: 32.1 pg (ref 26.0–34.0)
MCHC: 33.6 g/dL (ref 32.0–36.0)
MCV: 95.3 fL (ref 80.0–100.0)
PLATELETS: 141 10*3/uL — AB (ref 150–440)
RBC: 3.44 MIL/uL — AB (ref 4.40–5.90)
RDW: 14.1 % (ref 11.5–14.5)
WBC: 7.6 10*3/uL (ref 3.8–10.6)

## 2017-01-17 NOTE — Progress Notes (Signed)
Physical Therapy Treatment Patient Details Name: Craig [name] MRN: [id number] DOB: [birth year] Today's Date: 01/17/2017    History of Present Illness Pt is a 71 y/o M s/p L TKA.  Pt's PMH includes R TKA, cancer.   PT Comments    Craig [name] completed stair training this session and ambulated 160 ft using RW.  He requires cues for safe technique for transfers.  His L knee AAROM is currently 10 to 81 deg.  Follow up recommendations remain appropriate.     Follow Up Recommendations  Home health PT     Equipment Recommendations  None recommended by PT    Recommendations for Other Services       Precautions / Restrictions Precautions Precautions: Fall;Knee Precaution Booklet Issued: Yes (comment) Precaution Comments: Reminded pt of no pillow under knee Required Braces or Orthoses: Knee Immobilizer - Left Knee Immobilizer - Left: Other (comment)("If unable to perform straight leg raise") Restrictions Weight Bearing Restrictions: Yes LLE Weight Bearing: Weight bearing as tolerated    Mobility  Bed Mobility Overal bed mobility: Needs Assistance Bed Mobility: Supine to Sit     Supine to sit: Supervision;HOB elevated     General bed mobility comments: Increased time and effort but no physical assist.   Transfers Overall transfer level: Needs assistance Equipment used: Rolling walker (2 wheeled) Transfers: Sit to/from Stand Sit to Stand: Min guard         General transfer comment: When pt standing from bed he does so quickly, losing his balance, and cathcing himself by reaching out for bed rail.  Cues provided for safe transfer techniques.  For remainder of session pt performed sit<>stand x3, each time with safe technique and proper hand placement.    Ambulation/Gait Ambulation/Gait assistance: Min guard Ambulation Distance (Feet): 160 Feet Assistive device: Rolling walker (2 wheeled) Gait Pattern/deviations: Step-to pattern;Decreased stride length;Decreased stance  time - left;Decreased step length - right;Decreased weight shift to left;Antalgic;Step-through pattern Gait velocity: decreased Gait velocity interpretation: Below normal speed for age/gender General Gait Details: Pt initially with step to pattern, limited by stiffness and pain.   Pt progresses to step through pattern without cues.  Pt reaching out for railing on wall at times when ambulating in the hallway and education was provided on the importance of remaining steady with BUEs on RW rather than reaching outside of BOS.     Stairs Stairs: Yes   Stair Management: Two rails;Forwards;Step to pattern Number of Stairs: 4 General stair comments: Max verbal cues for proper technique and step by step cues for first step up.    Wheelchair Mobility    Modified Rankin (Stroke Patients Only)       Balance Overall balance assessment: Needs assistance Sitting-balance support: No upper extremity supported;Feet supported Sitting balance-Leahy Scale: Good     Standing balance support: During functional activity;Bilateral upper extremity supported Standing balance-Leahy Scale: Fair Standing balance comment: Pt able to stand statically without UE support but relies on UE support for dynamic activities                            Cognition Arousal/Alertness: Awake/alert Behavior During Therapy: WFL for tasks assessed/performed Overall Cognitive Status: Within Functional Limits for tasks assessed                                        Exercises Total Joint  Exercises Ankle Circles/Pumps: AROM;Both;10 reps;Supine Quad Sets: Strengthening;Both;10 reps;Supine Hip ABduction/ADduction: Strengthening;Left;10 reps;Supine Straight Leg Raises: Strengthening;Left;10 reps;AAROM;Supine Long Arc Quad: Strengthening;Left;Seated;5 reps;AAROM;Other (comment)(with focus on eccentric phase) Knee Flexion: AAROM;Left;5 reps;Seated;Other (comment)(with10 second holds) Goniometric ROM:  10 to 81 deg    General Comments        Pertinent Vitals/Pain Pain Assessment: 0-10 Pain Score: 8  Pain Location: L knee Pain Descriptors / Indicators: Aching;Grimacing;Guarding Pain Intervention(s): Limited activity within patient's tolerance;Monitored during session;Repositioned;Ice applied;Utilized relaxation techniques;Premedicated before session    Home Living                      Prior Function            PT Goals (current goals can now be found in the care plan section) Acute Rehab PT Goals Patient Stated Goal: to go home and return to PLOF PT Goal Formulation: With patient Time For Goal Achievement: 01/30/17 Potential to Achieve Goals: Good Progress towards PT goals: Progressing toward goals    Frequency    BID      PT Plan Current plan remains appropriate    Co-evaluation              AM-PAC PT "6 Clicks" Daily Activity  Outcome Measure  Difficulty turning over in bed (including adjusting bedclothes, sheets and blankets)?: A Little Difficulty moving from lying on back to sitting on the side of the bed? : A Little Difficulty sitting down on and standing up from a chair with arms (e.g., wheelchair, bedside commode, etc,.)?: A Little Help needed moving to and from a bed to chair (including a wheelchair)?: A Little Help needed walking in hospital room?: A Little Help needed climbing 3-5 steps with a railing? : A Little 6 Click Score: 18    End of Session Equipment Utilized During Treatment: Gait belt Activity Tolerance: Patient limited by pain;Patient tolerated treatment well Patient left: Other (comment);in chair;with call bell/phone within reach;with chair alarm set(with polar care and bone foam) Nurse Communication: Mobility status PT Visit Diagnosis: Pain;Unsteadiness on feet (R26.81);Other abnormalities of gait and mobility (R26.89);Muscle weakness (generalized) (M62.81) Pain - Right/Left: Left Pain - part of body: Knee     Time:  8832-5498 PT Time Calculation (min) (ACUTE ONLY): 47 min  Charges:  $Gait Training: 23-37 mins $Therapeutic Exercise: 8-22 mins                    G Codes:       Craig [name] PT, DPT 01/17/2017, 12:08 PM

## 2017-01-17 NOTE — Care Management Note (Signed)
Case Management Note  Patient Details  Name: Craig [name] MRN: [id number] Date of Birth: [birth year]  Subjective/Objective: Discharging today                  Action/Plan: Advanced notified of discharge.   Expected Discharge Date:  01/17/17               Expected Discharge Plan:  [name]  In-House Referral:     Discharge planning Services  CM Consult  Post Acute Care Choice:  Home Health Choice offered to:  Patient, Spouse  DME Arranged:    DME Agency:     HH Arranged:  PT Mutual:  [place]  Status of Service:  Completed, signed off  If discussed at [place] of Stay Meetings, dates discussed:    Additional Comments:  Jolly Mango, RN 01/17/2017, 9:32 AM

## 2017-01-17 NOTE — Progress Notes (Addendum)
Discharge note;  Spoke with MD Hooten about Pts BP. No new orders placed at this time and ok to discharge patient. Discharge instructions and prescriptions given to patient. IVs removed. Wife at bedside. Pt ready for discharge.

## 2017-01-17 NOTE — Progress Notes (Signed)
   Subjective: 2 Days Post-Op Procedure(s) (LRB): COMPUTER ASSISTED TOTAL KNEE ARTHROPLASTY (Left) Patient reports pain as 6 on 0-10 scale.  Complaining of the leg aching all the time. Taking pain medicine every 2 hours. Patient is well, and has had no acute complaints or problems We will start therapy today.  Plan is to go Home after hospital stay. no nausea and no vomiting Patient denies any chest pains or shortness of breath. Objective: Vital signs in last 24 hours: Temp:  [98 F (36.7 C)-99.5 F (37.5 C)] 98.4 F (36.9 C) (11/29 1921) Pulse Rate:  [64-81] 81 (11/29 1921) Resp:  [18-20] 20 (11/29 1921) BP: (122-135)/(67-80) 135/80 (11/29 1921) SpO2:  [95 %-100 %] 100 % (11/29 1921) well approximated incision Heels are non tender and elevated off the bed using rolled towels Intake/Output from previous day: 11/29 0701 - 11/30 0700 In: 480 [P.O.:480] Out: 1360 [Urine:1150; Drains:210] Intake/Output this shift: Total I/O In: -  Out: 560 [Urine:500; Drains:60]  Recent Labs    01/16/17 0324 01/17/17 0338  HGB 11.0* 11.0*   Recent Labs    01/16/17 0324 01/17/17 0338  WBC 7.9 7.6  RBC 3.41* 3.44*  HCT 32.3* 32.7*  PLT 154 141*   Recent Labs    01/16/17 0324 01/17/17 0338  NA 135 136  K 4.1 4.0  CL 103 104  CO2 24 27  BUN 16 15  CREATININE 1.05 1.07  GLUCOSE 115* 150*  CALCIUM 8.1* 8.3*   No results for input(s): LABPT, INR in the last 72 hours.  EXAM General - Patient is Alert, Appropriate and Oriented Extremity - Neurologically intact Neurovascular intact Sensation intact distally Intact pulses distally Dorsiflexion/Plantar flexion intact No cellulitis present Compartment soft Dressing - scant drainage Motor Function - intact, moving foot and toes well on exam.    Past Medical History:  Diagnosis Date  . Anginal pain ([place])   . BPH (benign prostatic hyperplasia)   . CAD (coronary artery disease)   . Cancer ([place])    skin cancer   . Chronic  kidney disease    left kidney removed  . GERD (gastroesophageal reflux disease)   . History of hiatal hernia   . Hypercholesterolemia   . Hypertension     Assessment/Plan: 2 Days Post-Op Procedure(s) (LRB): COMPUTER ASSISTED TOTAL KNEE ARTHROPLASTY (Left) Active Problems:   S/P total knee arthroplasty  Estimated body mass index is 32.08 kg/m as calculated from the following:   Height as of this encounter: 5\' 11"  (1.803 m).   Weight as of this encounter: 104.3 kg (230 lb). Up with therapy Discharge home with home health  Labs: Were reviewed and acceptable DVT Prophylaxis - Foot Pumps, TED hose and Eliquis Weight-Bearing as tolerated to left leg Hemovacs were discontinued on today's visit. Tip of the Hemovac were visually examined and were intact. Patient needs a bowel movement. Please wash the operative leg and change dressing. TED stockings are to be applied on both legs. Please give the patient 2 extra honeycomb dressings to take home  Jon R. Exeter Tatum 01/17/2017, 6:56 AM

## 2017-01-17 NOTE — Progress Notes (Signed)
Pt BP 173/90. Pt normally takes Lisinopril at home. No order for it. MD Hooten paged. Waiting for response.

## 2017-03-07 ENCOUNTER — Telehealth: Payer: Self-pay | Admitting: Radiology

## 2017-03-07 NOTE — Telephone Encounter (Signed)
Unable to LM on cell phone. LMOM of home phone. Received stool softener refill request. Requested return call to discuss.

## 2017-03-10 ENCOUNTER — Other Ambulatory Visit: Payer: Self-pay | Admitting: Radiology

## 2017-03-10 DIAGNOSIS — K5900 Constipation, unspecified: Secondary | ICD-10-CM

## 2017-03-10 MED ORDER — DOCUSATE SODIUM 100 MG PO CAPS: 100.0000 mg | ORAL_CAPSULE | Freq: Two times a day (BID) | ORAL | 3 refills | Status: AC

## 2017-03-10 NOTE — Telephone Encounter (Signed)
Spoke with pt's wife regarding request for stool softener refill. Wife states prescription is cheaper for them than OTC so script sent to pharmacy. Also, pt would like to make appt for urinary frequency. Appt made. Wife voices understanding of whole conversation & has no further questions at this time.

## 2017-03-21 ENCOUNTER — Encounter: Payer: Self-pay | Admitting: Urology

## 2017-03-21 ENCOUNTER — Ambulatory Visit (INDEPENDENT_AMBULATORY_CARE_PROVIDER_SITE_OTHER): Payer: Medicare Other | Admitting: Urology

## 2017-03-21 VITALS — BP 169/101 | HR 80 | Ht 71.0 in | Wt 231.0 lb

## 2017-03-21 DIAGNOSIS — R350 Frequency of micturition: Secondary | ICD-10-CM

## 2017-03-21 DIAGNOSIS — N401 Enlarged prostate with lower urinary tract symptoms: Secondary | ICD-10-CM

## 2017-03-21 DIAGNOSIS — N138 Other obstructive and reflux uropathy: Secondary | ICD-10-CM | POA: Diagnosis not present

## 2017-03-21 DIAGNOSIS — Z905 Acquired absence of kidney: Secondary | ICD-10-CM | POA: Diagnosis not present

## 2017-03-21 LAB — MICROSCOPIC EXAMINATION
Bacteria, UA: NONE SEEN
EPITHELIAL CELLS (NON RENAL): NONE SEEN /HPF (ref 0–10)
WBC UA: NONE SEEN /HPF (ref 0–?)

## 2017-03-21 LAB — URINALYSIS, COMPLETE
BILIRUBIN UA: NEGATIVE
Glucose, UA: NEGATIVE
Ketones, UA: NEGATIVE
LEUKOCYTES UA: NEGATIVE
NITRITE UA: NEGATIVE
PH UA: 6 (ref 5.0–7.5)
Protein, UA: NEGATIVE
RBC, UA: NEGATIVE
Specific Gravity, UA: 1.02 (ref 1.005–1.030)
Urobilinogen, Ur: 0.2 mg/dL (ref 0.2–1.0)

## 2017-03-21 LAB — BLADDER SCAN AMB NON-IMAGING

## 2017-03-21 NOTE — Progress Notes (Signed)
03/21/2017 3:48 PM   Craig [name] 27-Jan-1946 [id number]  Referring provider: Tracie Harrier, MD [address] [facility] [place], [place] [address]  Chief Complaint  Patient presents with  . 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    He remains on both finasteride and Flomax.     He continues to have severe refractory urinary symptoms including frequency, urgency, nocturia x3.  He did try Myrbetriq 25 mg x 1 month which did improve his urinary symptoms with this medication was prohibitively expensive and is not able to fill the prescription as such.  Since last visit, he has had several other surgeries including an oral surgery as well as knee replacement.  As part of his preoperative evaluation for his knee surgery, he did have a urine culture back in 12/2016 which grew Klebsiella.  Associated urinalysis with otherwise negative.  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 Surgery was uncomplicated. 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 No malignancy was identified.     PMH: Past Medical History:  Diagnosis Date  . Anginal pain ([place])   . BPH (benign prostatic hyperplasia)   . CAD (coronary artery disease)   . Cancer ([place])    skin cancer   . Chronic kidney disease    left kidney removed  . GERD (gastroesophageal reflux disease)   . History of hiatal hernia   . Hypercholesterolemia   . Hypertension     Surgical History: Past Surgical History:  Procedure Laterality Date  . CORONARY ARTERY BYPASS GRAFT  2018   3 bypasses  . IR GENERIC  HISTORICAL  09/29/2015   IR NEPHROSTOMY PLACEMENT LEFT 09/29/2015 [name], MD [facility]  . IR GENERIC HISTORICAL  10/20/2015   IR NEPHROSTOGRAM LEFT THRU EXISTING ACCESS 10/20/2015 [facility]  . JOINT REPLACEMENT     right knee  . KNEE ARTHROPLASTY Left 01/15/2017   Procedure: COMPUTER ASSISTED TOTAL KNEE ARTHROPLASTY;  Surgeon: Dereck Leep, MD;  Location: [facility] ORS;  Service: Orthopedics;  Laterality: Left;  . LAPAROSCOPIC NEPHRECTOMY, HAND ASSISTED Left 11/07/2015   Procedure: HAND ASSISTED LAPAROSCOPIC NEPHRECTOMY;  Surgeon: Hollice Espy, MD;  Location: [facility];  Service: Urology;  Laterality: Left;  . LEFT HEART CATH AND CORONARY ANGIOGRAPHY N/A 06/28/2016   Procedure: Left Heart Cath and Coronary Angiography;  Surgeon: Yolonda Kida, MD;  Location: [facility];  Service: Cardiovascular;  Laterality: N/A;  . PROSTATE ABLATION N/A 04/10/2016   Procedure: PROSTATE ABLATION;  Surgeon: Hollice Espy, MD;  Location: [facility] OR[facility];  Service: Urology;  Laterality: N/A;  . REPLACEMENT TOTAL KNEE Right   . THULIUM LASER TURP (TRANSURETHRAL RESECTION OF PROSTATE) N/A 04/10/2016   Procedure: THULIUM LASER TURP (TRANSURETHRAL RESECTION OF PROSTATE);  Surgeon: Hollice Espy, MD;  Location: [facility] ORS;  Service: Urology;  Laterality: N/A;  . TONSILLECTOMY      Home Medications:  Allergies as of 03/21/2017      Reactions   Peanut Oil Other (See Comments)   Mouth ulcers   Peanut-containing Drug Products Other (See Comments)   Mouth ulcers       Medication List        Accurate as  of 03/21/17 11:59 PM. Always use your most recent med list.          acetaminophen 500 MG tablet Commonly known as:  TYLENOL Take 500 mg every 6 (six) hours as needed by mouth for moderate pain or headache.   atorvastatin 80 MG tablet Commonly known as:  LIPITOR Take 80 mg by mouth at bedtime.   cetirizine 10 MG tablet Commonly known as:  ZYRTEC Take 10 mg by mouth daily.   docusate sodium  100 MG capsule Commonly known as:  COLACE Take 1 capsule (100 mg total) by mouth 2 (two) times daily.   ELIQUIS 5 MG Tabs tablet Generic drug:  apixaban Take 5 mg 2 (two) times daily by mouth.   ferrous sulfate 325 (65 FE) MG tablet Take 325 mg by mouth daily with breakfast.   finasteride 5 MG tablet Commonly known as:  PROSCAR Take 5 mg at bedtime by mouth.   FLUoxetine 20 MG capsule Commonly known as:  PROZAC Take 20 mg by mouth daily.   fluticasone 50 MCG/ACT nasal spray Commonly known as:  FLONASE Place 2 sprays daily into both nostrils.   gabapentin 100 MG capsule Commonly known as:  NEURONTIN Take 100 mg 2 (two) times daily by mouth.   multivitamin tablet Take 1 tablet by mouth daily.   omeprazole 40 MG capsule Commonly known as:  PRILOSEC Take 40 mg by mouth 2 (two) times daily.   tamsulosin 0.4 MG Caps capsule Commonly known as:  FLOMAX Take 0.4 mg at bedtime by mouth.   traMADol 50 MG tablet Commonly known as:  ULTRAM Take 50 mg by mouth every 8 (eight) hours as needed for moderate pain.   traZODone 50 MG tablet Commonly known as:  DESYREL Take 50 mg by mouth at bedtime.   vitamin E 400 UNIT capsule Take 800 Units daily by mouth.       Allergies:  Allergies  Allergen Reactions  . Peanut Oil Other (See Comments)    Mouth ulcers   . Peanut-Containing Drug Products Other (See Comments)    Mouth ulcers     Family History: Family History  Problem Relation Age of Onset  . Kidney cancer Mother   . Lung cancer Father   . Prostate cancer Neg Hx   . Bladder Cancer Neg Hx     Social History:  reports that he quit smoking about 33 years ago. He has a 30.00 pack-year smoking history. he has never used smokeless tobacco. He reports that he does not drink alcohol or use drugs.  ROS: UROLOGY Frequent Urination?: Yes Hard to postpone urination?: Yes Burning/pain with urination?: No Get up at night to urinate?: Yes Leakage of urine?: Yes Urine  stream starts and stops?: Yes Trouble starting stream?: Yes Do you have to strain to urinate?: No Blood in urine?: No Urinary tract infection?: No Sexually transmitted disease?: No Injury to kidneys or bladder?: No Painful intercourse?: No Weak stream?: No Erection problems?: No Penile pain?: No  Gastrointestinal Nausea?: No Vomiting?: No Indigestion/heartburn?: No Diarrhea?: No Constipation?: No  Constitutional Fever: No Night sweats?: No Weight loss?: No Fatigue?: No  Skin Skin rash/lesions?: No Itching?: No  Eyes Blurred vision?: No Double vision?: No  Ears/Nose/Throat Sore throat?: No Sinus problems?: No  Hematologic/Lymphatic Swollen glands?: No Easy bruising?: No  Cardiovascular Leg swelling?: No Chest pain?: No  Respiratory Cough?: No Shortness of breath?: No  Endocrine Excessive thirst?: No  Musculoskeletal Back pain?: No Joint pain?: No  Neurological Headaches?:  No Dizziness?: No  Psychologic Depression?: No Anxiety?: No  Physical Exam: BP (!) 169/101   Pulse 80   Ht 5\' 11"  (1.803 m)   Wt 231 lb (104.8 kg)   BMI 32.22 kg/m   Constitutional:  Alert and oriented, No acute distress.   HEENT: [place] AT, moist mucus membranes.  Trachea midline, no masses. Cardiovascular: No clubbing, cyanosis, or edema.  Respiratory: Normal respiratory effort, no increased work of breathing.  Skin: No rashes, bruises or suspicious lesions. Neurologic: Grossly intact, no focal deficits, moving all 4 extremities. Psychiatric: Normal mood and affect.  Laboratory Data: Lab Results  Component Value Date   WBC 7.6 01/17/2017   HGB 11.0 (L) 01/17/2017   HCT 32.7 (L) 01/17/2017   MCV 95.3 01/17/2017   PLT 141 (L) 01/17/2017    Lab Results  Component Value Date   CREATININE 1.07 01/17/2017   Results for orders placed or performed in visit on 03/21/17  Microscopic Examination  Result Value Ref Range   WBC, UA None seen 0 - 5 /hpf   RBC, UA 0-2 0 -  2 /hpf   Epithelial Cells (non renal) None seen 0 - 10 /hpf   Mucus, UA Present (A) Not Estab.   Bacteria, UA None seen None seen/Few  Urinalysis, Complete  Result Value Ref Range   Specific Gravity, UA 1.020 1.005 - 1.030   pH, UA 6.0 5.0 - 7.5   Color, UA Yellow Yellow   Appearance Ur Clear Clear   Leukocytes, UA Negative Negative   Protein, UA Negative Negative/Trace   Glucose, UA Negative Negative   Ketones, UA Negative Negative   RBC, UA Negative Negative   Bilirubin, UA Negative Negative   Urobilinogen, Ur 0.2 0.2 - 1.0 mg/dL   Nitrite, UA Negative Negative   Microscopic Examination See below:   BLADDER SCAN AMB NON-IMAGING  Result Value Ref Range   Scan Result 71ml    Results for orders placed or performed in visit on 03/21/17  Microscopic Examination  Result Value Ref Range   WBC, UA None seen 0 - 5 /hpf   RBC, UA 0-2 0 - 2 /hpf   Epithelial Cells (non renal) None seen 0 - 10 /hpf   Mucus, UA Present (A) Not Estab.   Bacteria, UA None seen None seen/Few  Urinalysis, Complete  Result Value Ref Range   Specific Gravity, UA 1.020 1.005 - 1.030   pH, UA 6.0 5.0 - 7.5   Color, UA Yellow Yellow   Appearance Ur Clear Clear   Leukocytes, UA Negative Negative   Protein, UA Negative Negative/Trace   Glucose, UA Negative Negative   Ketones, UA Negative Negative   RBC, UA Negative Negative   Bilirubin, UA Negative Negative   Urobilinogen, Ur 0.2 0.2 - 1.0 mg/dL   Nitrite, UA Negative Negative   Microscopic Examination See below:   BLADDER SCAN AMB NON-IMAGING  Result Value Ref Range   Scan Result 35ml     Assessment & Plan:    1. BPH with obstruction/lower urinary tract symptoms, urinary frequency s/p laser ablation of the prostate on 04/10/2016 Continues on finasteride and Flomax Urinary symptoms primarily irritative UA today negative, no evidence of infection as concerning factor Symptoms possibly exacerbated by recent stroke Given samples of Mybetriq 50 mg 4  weeks today given fairly good results with Myrbetriq 25 mg but could be optimized Discussed to call our office if this medication helps, will work with Vikki Ports in our office to see if  we can find a more cost effective way to obtain this medication If he fails to do so, would strongly consider PTNS or Botox for refractory OAB symptoms  2. History of urinary retention As above  PVR today minimal   3. Left renal atrophy Secondary to chronic UPJ obstruction S/p left nephrectomy 9/17  Return in about 6 months (around 09/18/2017) for IPSS/ PVR/ UA.  Hollice Espy, MD  [facility] [address] [place], [place],  [address] [phone]

## 2017-03-25 LAB — CULTURE, URINE COMPREHENSIVE

## 2017-04-01 ENCOUNTER — Ambulatory Visit: Payer: Medicare Other | Admitting: Urology

## 2017-05-05 ENCOUNTER — Telehealth: Payer: Self-pay | Admitting: Urology

## 2017-05-05 NOTE — Telephone Encounter (Signed)
Pt called office stating he needs something cheaper than Mybetriq, pt states that this Rx costs $200/month.  Pt also stated "nurse" told him she was working on this but hasn't heard from anyone. Please advise.  Thanks.

## 2017-05-08 NOTE — Telephone Encounter (Signed)
Was not able to get PA approved. Please advise.

## 2017-05-08 NOTE — Telephone Encounter (Signed)
Please have him call his insurance to see what is covered for OAB.  Hollice Espy, MD

## 2017-05-12 NOTE — Telephone Encounter (Signed)
Spoke with pt in reference to insurance and medications. Pt voiced understanding stating he will call back.

## 2017-06-07 IMAGING — XA IR NEPHROSTOGRAM EXISTING ACCESS LEFT
2 series · 2 of 2 positions shown · non-contrast
Comparison: CT the abdomen pelvis as 09/25/2015;

INDICATION: History of chronic left-sided UPJ obstruction with nearly
nonfunctioning left kidney, however the patient recently developed
urosepsis for which the patient underwent a technically successful
left-sided ultrasound and fluoroscopic guided percutaneous
nephrostomy catheter placement on 09/29/2015.

[Series 1: fl - angio · 1 of 1 slices shown (1 of 2)]
[im 1/1]
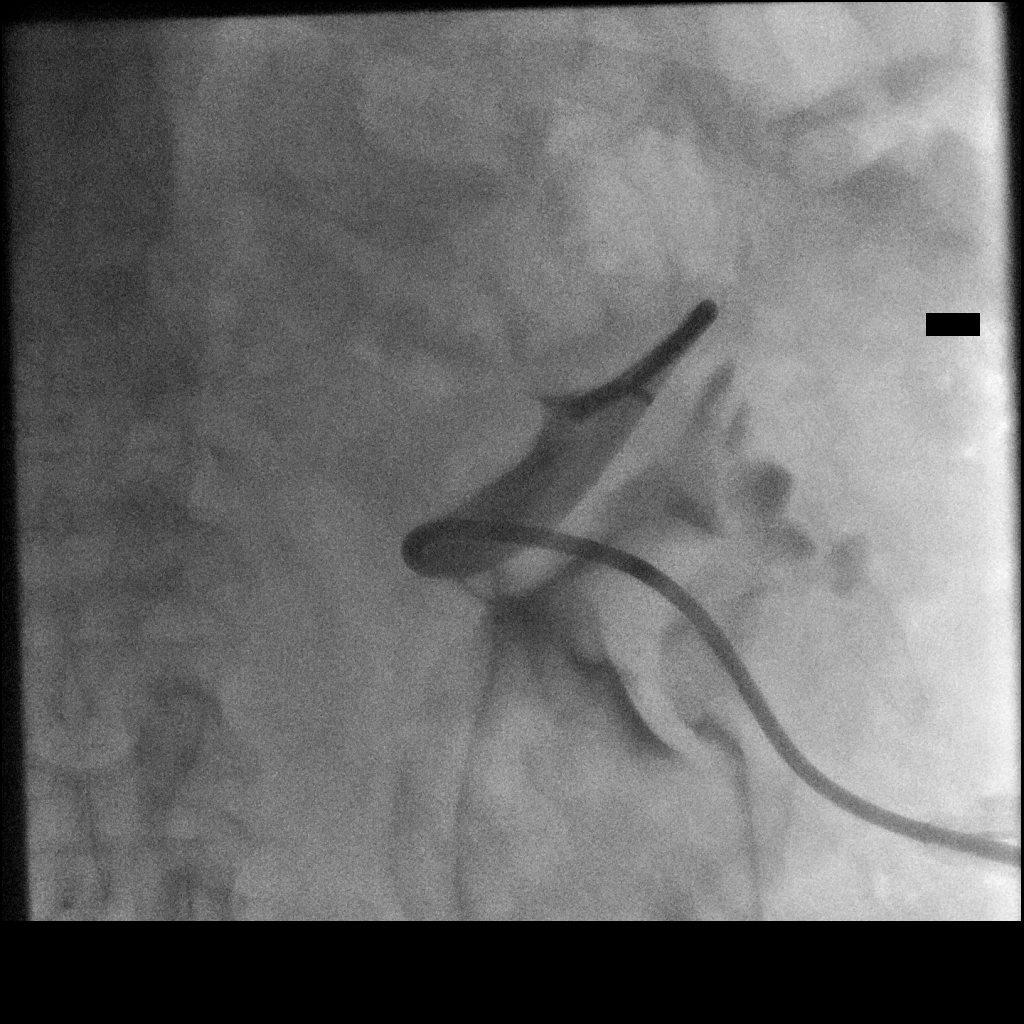

[Series 2: fl - angio · 1 of 1 slices shown (2 of 2)]
[im 1/1]
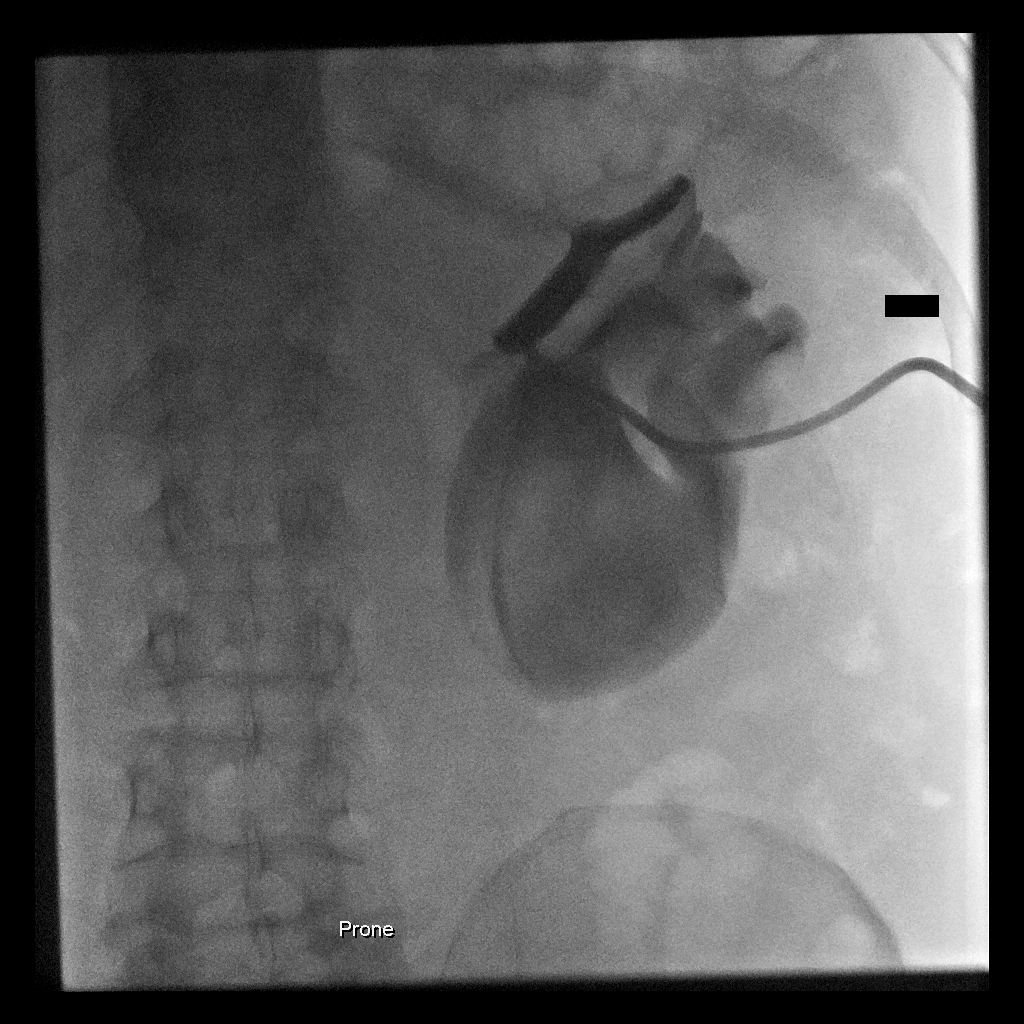

[2 of 2 positions shown; findings below may reference images not displayed]

The patient reports very little output from the left-sided
percutaneous nephrostomy catheter for the past 2-3 days and presents
today for left-sided percutaneous nephrostogram and potential
exchange.

Note, the patient is to undergo left-sided nephrectomy later this
month.

EXAM:
IR NEPHROSTOGRAM EXISTING ACCESS LEFT
08/16/2013;
ultrasound fluoroscopic guided left-sided percutaneous nephrostomy
catheter placement - 09/29/2015

MEDICATIONS:
None

ANESTHESIA/SEDATION:
None

CONTRAST:  20mL JIHUD4-BZZ IOPAMIDOL (JIHUD4-BZZ) INJECTION 61% -
administered into the collecting system(s)

FLUOROSCOPY TIME:  Fluoroscopy Time: 12 seconds (13 mGy).

COMPLICATIONS:
None immediate.

PROCEDURE:
Informed written consent was obtained from the patient after a
thorough discussion of the procedural risks, benefits and
alternatives. All questions were addressed. Maximal Sterile Barrier
Technique was utilized including caps, mask, sterile gowns, sterile
gloves, sterile drape, hand hygiene and skin antiseptic. A timeout
was performed prior to the initiation of the procedure.

The patient was positioned supine on the fluoroscopy table.

Preprocedural spot fluoroscopic image was obtained of the existing
left-sided percutaneous nephrostomy catheter. Small amount of
contrast was injected into the left renal collecting system as
several spot fluoroscopic images were obtained in various
obliquities. The images reviewed in the procedure was terminated.
FINDINGS: Preprocedural spot fluoroscopic image demonstrates unchanged
positioning of the left-sided percutaneous nephrostomy catheter
overlying expected location of left renal fossa.

Contrast injection demonstrated decompression of one of the markedly
dilated calices within the superior pole the left kidney.

There is contrast opacification of multiple additional enlarged and
dysmorphic appearing calices as well as the enlarged left-sided
renal pelvis compatible with history of known chronic left UPJ
obstruction.

There is no definitive opacification of the left ureter.
IMPRESSION: Appropriately positioned and functioning left-sided percutaneous
nephrostomy catheter. Unfortunately, there is very little output
from the patient's nephrostomy catheter due to the nearly non
functional status of the left kidney.

As the patient is undergoing surgical resection of the left kidney
later this month, fluoroscopic guided exchange was not performed at
this time.

## 2017-06-23 ENCOUNTER — Ambulatory Visit (INDEPENDENT_AMBULATORY_CARE_PROVIDER_SITE_OTHER): Payer: Medicare Other | Admitting: Podiatry

## 2017-06-23 ENCOUNTER — Ambulatory Visit (INDEPENDENT_AMBULATORY_CARE_PROVIDER_SITE_OTHER): Payer: Medicare Other

## 2017-06-23 ENCOUNTER — Other Ambulatory Visit: Payer: Self-pay | Admitting: Podiatry

## 2017-06-23 ENCOUNTER — Encounter: Payer: Self-pay | Admitting: Podiatry

## 2017-06-23 DIAGNOSIS — S93602A Unspecified sprain of left foot, initial encounter: Secondary | ICD-10-CM | POA: Diagnosis not present

## 2017-06-23 DIAGNOSIS — T1490XA Injury, unspecified, initial encounter: Secondary | ICD-10-CM

## 2017-06-23 DIAGNOSIS — R609 Edema, unspecified: Secondary | ICD-10-CM

## 2017-06-23 NOTE — Progress Notes (Signed)
This patient presents the office with chief complaint of a painful left foot.  He says approximately 6 weeks ago he dropped firewood on his left foot.  He says the foot was bruised and ultimately healed and became pain-free.  He says that approximately 3 weeks after the injury He started developing pain and discomfort on the outside of his left foot. He states he went to occur noted in clinic approximately one week ago where x-rays were taken.  He says that the x-rays revealed no evidence of any bony break.  He presents the office today 6 weeks after the injury having severe pain along the outside of his left foot under his ankle bone.  He states he has had no treatment for this and presents the office today for an evaluation and treatment.   General Appearance  Alert, conversant and in no acute stress.  Vascular  Dorsalis pedis and posterior tibial  pulses are palpable  bilaterally.  Capillary return is within normal limits  bilaterally. Temperature is within normal limits  bilaterally.  Neurologic  Senn-Weinstein monofilament wire test within normal limits  bilaterally. Muscle power within normal limits bilaterally.  Nails Normal nails noted with no evidence of any bacterial or fungal infection.  Orthopedic  No limitations of motion of motion feet .  No crepitus or effusions noted.  No bony pathology or digital deformities noted. Patient hasredness and swelling noted along turse of the peroneal tendon on the left anklefoot.  He has noticeable. Palpable pain noted in the cuboid of the left foot and in the sinus tarsi of the left foot.  Muscle power is within normal limits.  Skin  normotropic skin with no porokeratosis noted bilaterally.  No signs of infections or ulcers noted.   With no evidence of fungal or bacterial infection.  Foot Sprain left foot.  IE  X-rays taken reveal questionable fracture at the base of the third and fourth metatarsals, left foot.  There is also a questionable fracture to  the cuboid left foot.  Clinically he does have significant inflammation noted at the level of the cuboid and sinus tarsi left foot.  I am diagnosing him as having a foot sprain since he was pain-free at approximately 3 weeks.  I recommended he wear a Cam Walker for the next 3 weeks and allow his left foot to heal. .  Call the office if any problems occur..  Dr. Milinda Pointer evaluated the x-rays with me.   Gardiner Barefoot DPM

## 2017-07-09 ENCOUNTER — Telehealth: Payer: Self-pay | Admitting: Podiatry

## 2017-07-09 NOTE — Telephone Encounter (Signed)
Pt needs another boot because the one he had is no longer any good. Please call pt if he is able to get a new boot. Pt said he has put super glue on the boot to hold it together and its still not working. He would like a call today, if he is able to pick boot up.

## 2017-07-11 NOTE — Telephone Encounter (Signed)
Spoke with patient regarding boot, he reports the velcro is coming off the stem of the boot and the straps are not staying velcroed and is defective.  I informed him to come by office and pick up a new boot.

## 2017-07-17 ENCOUNTER — Ambulatory Visit (INDEPENDENT_AMBULATORY_CARE_PROVIDER_SITE_OTHER): Payer: Medicare Other | Admitting: Podiatry

## 2017-07-17 ENCOUNTER — Encounter: Payer: Self-pay | Admitting: Podiatry

## 2017-07-17 DIAGNOSIS — R609 Edema, unspecified: Secondary | ICD-10-CM | POA: Diagnosis not present

## 2017-07-17 DIAGNOSIS — S93602A Unspecified sprain of left foot, initial encounter: Secondary | ICD-10-CM

## 2017-07-17 MED ORDER — MELOXICAM 7.5 MG PO TABS: 7.5000 mg | ORAL_TABLET | Freq: Every day | ORAL | 0 refills | Status: AC

## 2017-07-17 NOTE — Progress Notes (Signed)
Patient returns to  the office for an evaluation of his sprained left foot.  Patient states that he still has pain at the site of the injury and there is swelling persisting on the outside top of his left foot.  Patient states that he does well when he wears his cam walker.  He still describes pain walking in his regular footgear.  Patient states that he is improving and is having less discomfort on the inside of his left foot during gait.  He presents to the office  today for continued evaluation and treatment of his sprained left foot.  General Appearance  Alert, conversant and in no acute stress.  Vascular  Dorsalis pedis and posterior tibial  pulses are palpable  bilaterally.  Capillary return is within normal limits  bilaterally. Temperature is within normal limits  bilaterally.  Neurologic  Senn-Weinstein monofilament wire test within normal limits  bilaterally. Muscle power within normal limits bilaterally.  Nails normal nails noted with no evidence of any bacterial or fungal infection  Orthopedic  No limitations of motion of motion feet .  No crepitus or effusions noted.  No bony pathology or digital deformities noted.palpable pain noted over the cuboid with increased swelling over the cuboid.  Pain along the course of the peroneal tendons left ankle.    Skin  normotropic skin with no porokeratosis noted bilaterally.  No signs of infections or ulcers noted.    Foot Sprain left foot.    ROV.  Rechecked his x-rays and there looks like there may be bony pathology to the cuboid bone.  There is no pain at the base of the metatarsals of the left foot  upon exam. Clinically there is improvement of his left foot but there is persistent pain at the site of the cuboid.  I prescribed Mobic 7.5 mg for this patient to be taken daily.  He was also given a compression sock to wear daily.  Patient was also told to ice and soaking his foot after days activity.  Patient to return to the office in 3 weeks for  continued  evaluation and treatment.   Gardiner Barefoot DPM

## 2017-08-04 ENCOUNTER — Ambulatory Visit (INDEPENDENT_AMBULATORY_CARE_PROVIDER_SITE_OTHER): Payer: Medicare Other | Admitting: Podiatry

## 2017-08-04 ENCOUNTER — Encounter: Payer: Self-pay | Admitting: Podiatry

## 2017-08-04 DIAGNOSIS — S93602D Unspecified sprain of left foot, subsequent encounter: Secondary | ICD-10-CM | POA: Diagnosis not present

## 2017-08-04 NOTE — Progress Notes (Signed)
Patient returns to  the office for an evaluation of his sprained left foot.  Patient states that he has experienced  No pain or discomfort in his left foot for the last 2 weeks.  He states he has been wearing his surgical compression sock in addition to his Cam Walker.  E states that he is very pleased with his progress and presents to  the office today for continued evaluation and treatment.  General Appearance  Alert, conversant and in no acute stress.  Vascular  Dorsalis pedis and posterior tibial  pulses are palpable  bilaterally.  Capillary return is within normal limits  bilaterally. Temperature is within normal limits  bilaterally.  Neurologic  Senn-Weinstein monofilament wire test within normal limits  bilaterally. Muscle power within normal limits bilaterally.  Nails normal nails noted with no evidence of any bacterial or fungal infection  Orthopedic  No limitations of motion of motion feet .  No crepitus or effusions noted.  No bony pathology or digital deformities noted. Marland KitchenNo palpable pain over the cuboid, no sinus tarsi pain and no pain along the peroneal tendon left foot..    Skin  normotropic skin with no porokeratosis noted bilaterally.  No signs of infections or ulcers noted.    Foot Sprain left foot.    ROV.  Rechecked his x-rays and there looks like there may be bony pathology to the cuboid bone.  There is no pain at the base of the metatarsals and the cuboid  of the left foot  upon exam. Clinically there is improvement of his left foot . Patient was told to try to wear regular footgear.   Patient to return to the office in three weeks for final xrays. Call the office as needed.   Gardiner Barefoot DPM

## 2017-08-25 ENCOUNTER — Ambulatory Visit (INDEPENDENT_AMBULATORY_CARE_PROVIDER_SITE_OTHER): Payer: Medicare Other

## 2017-08-25 ENCOUNTER — Ambulatory Visit (INDEPENDENT_AMBULATORY_CARE_PROVIDER_SITE_OTHER): Payer: Medicare Other | Admitting: Podiatry

## 2017-08-25 ENCOUNTER — Encounter: Payer: Self-pay | Admitting: Podiatry

## 2017-08-25 DIAGNOSIS — S93602D Unspecified sprain of left foot, subsequent encounter: Secondary | ICD-10-CM

## 2017-08-25 DIAGNOSIS — S92015D Nondisplaced fracture of body of left calcaneus, subsequent encounter for fracture with routine healing: Secondary | ICD-10-CM

## 2017-08-25 DIAGNOSIS — S93602A Unspecified sprain of left foot, initial encounter: Secondary | ICD-10-CM | POA: Diagnosis not present

## 2017-08-26 ENCOUNTER — Encounter: Payer: Self-pay | Admitting: Podiatry

## 2017-08-26 NOTE — Progress Notes (Signed)
Patient returns to  the office for an evaluation of his sprained left foot.  Patient states that he has experienced  No pain or discomfort in his left foot for the last 2 weeks.  He states he has been wearing his surgical compression sock and wearing his regular footgear.  He says he wore the cam walker for the one week following his last visit.  The last two weeks he has been wearing regular shoes.   He  states that he is very pleased with his progress and presents to  the office today for continued evaluation and treatment.  General Appearance  Alert, conversant and in no acute stress.  Vascular  Dorsalis pedis and posterior tibial  pulses are palpable  bilaterally.  Capillary return is within normal limits  bilaterally. Temperature is within normal limits  bilaterally.  Neurologic  Senn-Weinstein monofilament wire test within normal limits  bilaterally. Muscle power within normal limits bilaterally.  Nails normal nails noted with no evidence of any bacterial or fungal infection  Orthopedic  No limitations of motion of motion feet .  No crepitus or effusions noted.  No bony pathology or digital deformities noted. Marland KitchenNo palpable pain over the cuboid, no sinus tarsi pain and no pain along the peroneal tendon left foot..    Skin  normotropic skin with no porokeratosis noted bilaterally.  No signs of infections or ulcers noted.    Foot Sprain left foot.    ROV.  Xrays reveal no displaced fracture.  There are signs of arthritic changes and when questioned he admits to foot sprain injuries over 20 years.  There is no pain at the base of the metatarsals and the cuboid  of the left foot  upon exam. Clinically there is improvement of his left foot . Patient was told to continue  to wear regular footgear.    Call the office as needed.   Gardiner Barefoot DPM

## 2017-09-18 ENCOUNTER — Other Ambulatory Visit: Payer: Self-pay

## 2017-09-18 ENCOUNTER — Ambulatory Visit (INDEPENDENT_AMBULATORY_CARE_PROVIDER_SITE_OTHER): Payer: Medicare Other | Admitting: Urology

## 2017-09-18 ENCOUNTER — Encounter: Payer: Self-pay | Admitting: Urology

## 2017-09-18 VITALS — BP 163/91 | HR 75 | Ht 71.0 in | Wt 233.6 lb

## 2017-09-18 DIAGNOSIS — R350 Frequency of micturition: Secondary | ICD-10-CM | POA: Diagnosis not present

## 2017-09-18 DIAGNOSIS — N138 Other obstructive and reflux uropathy: Secondary | ICD-10-CM | POA: Diagnosis not present

## 2017-09-18 DIAGNOSIS — N401 Enlarged prostate with lower urinary tract symptoms: Secondary | ICD-10-CM | POA: Diagnosis not present

## 2017-09-18 LAB — MICROSCOPIC EXAMINATION: RBC, UA: NONE SEEN /hpf (ref 0–2)

## 2017-09-18 LAB — URINALYSIS, COMPLETE
Bilirubin, UA: NEGATIVE
GLUCOSE, UA: NEGATIVE
Leukocytes, UA: NEGATIVE
Nitrite, UA: NEGATIVE
PH UA: 6 (ref 5.0–7.5)
Protein, UA: NEGATIVE
RBC, UA: NEGATIVE
Specific Gravity, UA: 1.025 (ref 1.005–1.030)
UUROB: 1 mg/dL (ref 0.2–1.0)

## 2017-09-18 LAB — BLADDER SCAN AMB NON-IMAGING: SCAN RESULT: 0

## 2017-09-18 MED ORDER — OXYBUTYNIN CHLORIDE 5 MG PO TABS
5.0000 mg | ORAL_TABLET | Freq: Three times a day (TID) | ORAL | 0 refills | Status: DC | PRN
Start: 2017-09-18 — End: 2020-02-01

## 2017-09-18 NOTE — Progress Notes (Addendum)
09/18/2017 4:28 PM   Craig [name] 1945-04-22 [id number]  Referring provider: Tracie Harrier, MD [address] [facility] [place], [place] [address]  Chief Complaint  Patient presents with  . 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    He remains on both finasteride and Flomax.      He continues to have moderate  refractory urinary symptoms including frequency, urgency, nocturia x2 which is slightly improved from previous visit.  He reports that overall, his symptoms are better but still bothersome.  IPSS as below.   He is tried both Myrbetriq 25 mg and 50 mg but unable to get this covered reasonably by his insurance company.  It is prohibitively expensive.  No dysuria or gross hematuria.    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 Surgery was uncomplicated. 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 No malignancy was identified.     IPSS    Row Name 09/18/17 0900         International Prostate Symptom Score   How often have you had the sensation of not emptying your bladder?  Less than half the time     How often have you had to urinate less than every two hours?  More than half the time     How often have you found you stopped and started again several times when you urinated?  Not at All     How often have you found it difficult to postpone urination?  About half the time     How often have you had a weak urinary stream?  About half the time     How often have you had to strain to start urination?  Less than half the time     How many times did you typically get up at night to urinate?  2 Times     Total IPSS Score   16       Quality of Life due to urinary symptoms   If you were to spend the rest of your life with your urinary condition just the way it is now how would you feel about that?  Mixed        Score:  1-7 Mild 8-19 Moderate 20-35 Severe    PMH: Past Medical History:  Diagnosis Date  . Anginal pain ([place])   . BPH (benign prostatic hyperplasia)   . CAD (coronary artery disease)   . Cancer ([place])    skin cancer   . Chronic kidney disease    left kidney removed  . GERD (gastroesophageal reflux disease)   . History of hiatal hernia   . Hypercholesterolemia   . Hypertension     Surgical History: Past Surgical History:  Procedure Laterality Date  . CORONARY ARTERY BYPASS GRAFT  2018   3 bypasses  . IR GENERIC HISTORICAL  09/29/2015   IR NEPHROSTOMY PLACEMENT LEFT 09/29/2015 [name], MD [facility]  . IR GENERIC HISTORICAL  10/20/2015   IR NEPHROSTOGRAM LEFT THRU EXISTING ACCESS 10/20/2015 [facility]  . JOINT REPLACEMENT     right knee  . KNEE ARTHROPLASTY Left 01/15/2017   Procedure: COMPUTER ASSISTED TOTAL KNEE ARTHROPLASTY;  Surgeon: Dereck Leep, MD;  Location: [facility] OR[facility];  Service: Orthopedics;  Laterality: Left;  .  LAPAROSCOPIC NEPHRECTOMY, HAND ASSISTED Left 11/07/2015   Procedure: HAND ASSISTED LAPAROSCOPIC NEPHRECTOMY;  Surgeon: Hollice Espy, MD;  Location: [facility];  Service: Urology;  Laterality: Left;  . LEFT HEART CATH AND CORONARY ANGIOGRAPHY N/A 06/28/2016   Procedure: Left Heart Cath and Coronary Angiography;  Surgeon: Yolonda Kida, MD;  Location: [place] [facility];  Service: Cardiovascular;  Laterality: N/A;  . PROSTATE ABLATION N/A 04/10/2016   Procedure: PROSTATE ABLATION;  Surgeon: Hollice Espy, MD;  Location: [facility];  Service: Urology;  Laterality: N/A;  . REPLACEMENT TOTAL KNEE Right   . THULIUM LASER TURP (TRANSURETHRAL RESECTION OF PROSTATE) N/A 04/10/2016   Procedure: THULIUM LASER TURP (TRANSURETHRAL RESECTION OF PROSTATE);  Surgeon:  Hollice Espy, MD;  Location: [facility];  Service: Urology;  Laterality: N/A;  . TONSILLECTOMY      Home Medications:  Allergies as of 09/18/2017      Reactions   Peanut Oil Other (See Comments)   Mouth ulcers Mouth ulcers   Peanut-containing Drug Products Other (See Comments)   Mouth ulcers       Medication List        Accurate as of 09/18/17  4:28 PM. Always use your most recent med list.          acetaminophen 500 MG tablet Commonly known as:  TYLENOL Take 500 mg every 6 (six) hours as needed by mouth for moderate pain or headache.   atorvastatin 80 MG tablet Commonly known as:  LIPITOR Take 80 mg by mouth at bedtime.   cetirizine 10 MG tablet Commonly known as:  ZYRTEC Take 10 mg by mouth daily.   docusate sodium 100 MG capsule Commonly known as:  COLACE Take 1 capsule (100 mg total) by mouth 2 (two) times daily.   ELIQUIS 5 MG Tabs tablet Generic drug:  apixaban Take 5 mg 2 (two) times daily by mouth.   ferrous sulfate 325 (65 FE) MG tablet Take 325 mg by mouth daily with breakfast.   finasteride 5 MG tablet Commonly known as:  PROSCAR Take 5 mg at bedtime by mouth.   FLUoxetine 20 MG capsule Commonly known as:  PROZAC Take 20 mg by mouth daily.   fluticasone 50 MCG/ACT nasal spray Commonly known as:  FLONASE Place 2 sprays daily into both nostrils.   gabapentin 100 MG capsule Commonly known as:  NEURONTIN Take 100 mg 2 (two) times daily by mouth.   meloxicam 7.5 MG tablet Commonly known as:  MOBIC Take 1 tablet (7.5 mg total) by mouth daily.   multivitamin tablet Take 1 tablet by mouth daily.   omeprazole 40 MG capsule Commonly known as:  PRILOSEC Take 40 mg by mouth 2 (two) times daily.   oxybutynin 5 MG tablet Commonly known as:  DITROPAN Take 1 tablet (5 mg total) by mouth every 8 (eight) hours as needed for bladder spasms.   tamsulosin 0.4 MG Caps capsule Commonly known as:  FLOMAX Take 0.4 mg at bedtime by mouth.   traMADol 50 MG  tablet Commonly known as:  ULTRAM Take 50 mg by mouth every 8 (eight) hours as needed for moderate pain.   traZODone 50 MG tablet Commonly known as:  DESYREL Take 50 mg by mouth at bedtime.   vitamin E 400 UNIT capsule Take 800 Units daily by mouth.       Allergies:  Allergies  Allergen Reactions  . Peanut Oil Other (See Comments)    Mouth ulcers  Mouth ulcers  . Peanut-Containing Drug Products  Other (See Comments)    Mouth ulcers     Family History: Family History  Problem Relation Age of Onset  . Kidney cancer Mother   . Lung cancer Father   . Prostate cancer Neg Hx   . Bladder Cancer Neg Hx     Social History:  reports that he quit smoking about 34 years ago. He has a 30.00 pack-year smoking history. He has never used smokeless tobacco. He reports that he does not drink alcohol or use drugs.  ROS: UROLOGY Frequent Urination?: Yes Hard to postpone urination?: Yes Burning/pain with urination?: No Get up at night to urinate?: No Leakage of urine?: No Urine stream starts and stops?: No Trouble starting stream?: No Do you have to strain to urinate?: No Blood in urine?: No Urinary tract infection?: No Sexually transmitted disease?: No Injury to kidneys or bladder?: No Painful intercourse?: No Weak stream?: No Erection problems?: No Penile pain?: No  Gastrointestinal Nausea?: No Vomiting?: No Indigestion/heartburn?: No Diarrhea?: No Constipation?: No  Constitutional Fever: No Night sweats?: No Weight loss?: No Fatigue?: No  Skin Skin rash/lesions?: No Itching?: No  Eyes Blurred vision?: No Double vision?: No  Ears/Nose/Throat Sore throat?: No Sinus problems?: No  Hematologic/Lymphatic Swollen glands?: No Easy bruising?: No  Cardiovascular Leg swelling?: No Chest pain?: No  Respiratory Cough?: No Shortness of breath?: No  Endocrine Excessive thirst?: No  Musculoskeletal Back pain?: No Joint pain?:  No  Neurological Headaches?: No Dizziness?: No  Psychologic Depression?: No Anxiety?: No  Physical Exam: BP (!) 163/91   Pulse 75   Ht 5\' 11"  (1.803 m)   Wt 233 lb 9.6 oz (106 kg)   BMI 32.58 kg/m   Constitutional:  Alert and oriented, No acute distress. HEENT: [place] AT, moist mucus membranes.  Trachea midline, no masses. Cardiovascular: No clubbing, cyanosis, or edema. Respiratory: Normal respiratory effort, no increased work of breathing. GI: Abdomen is soft, nontender, nondistended, no abdominal masses Rectal: Normal sphincter tone, 40 cc prostate, diffusely firm without nodules, nontender.   Skin: No rashes, bruises or suspicious lesions. Neurologic: Grossly intact, no focal deficits, moving all 4 extremities. Psychiatric: Normal mood and affect.  Laboratory Data: Lab Results  Component Value Date   WBC 7.6 01/17/2017   HGB 11.0 (L) 01/17/2017   HCT 32.7 (L) 01/17/2017   MCV 95.3 01/17/2017   PLT 141 (L) 01/17/2017    Lab Results  Component Value Date   CREATININE 1.07 01/17/2017   Urinalysis Results for orders placed or performed in visit on 09/18/17  Microscopic Examination  Result Value Ref Range   WBC, UA 6-10 (A) 0 - 5 /hpf   RBC, UA None seen 0 - 2 /hpf   Epithelial Cells (non renal) CANCELED    Mucus, UA Present (A) Not Estab.   Bacteria, UA Moderate (A) None seen/Few  Urinalysis, Complete  Result Value Ref Range   Specific Gravity, UA 1.025 1.005 - 1.030   pH, UA 6.0 5.0 - 7.5   Color, UA Yellow Yellow   Appearance Ur Clear Clear   Leukocytes, UA Negative Negative   Protein, UA Negative Negative/Trace   Glucose, UA Negative Negative   Ketones, UA Trace (A) Negative   RBC, UA Negative Negative   Bilirubin, UA Negative Negative   Urobilinogen, Ur 1.0 0.2 - 1.0 mg/dL   Nitrite, UA Negative Negative   Microscopic Examination See below:   BLADDER SCAN AMB NON-IMAGING  Result Value Ref Range   Scan Result 0  Pertinent Imaging: PVR 0     Assessment & Plan:    1. BPH with obstruction/lower urinary tract symptoms Obstructive urinary symptoms stable on finasteride and Flomax We will plan to continue these medications for the time being PSA today, rectal exam fairly unremarkable Adequate bladder emptying today - Urinalysis, Complete - BLADDER SCAN AMB NON-IMAGING - PSA  2. Urinary frequency Persistent irritative voiding symptoms without hematuria or dysuria No concern for infection We discussed addition of anticholinergic given difficulty with reimbursement with beta 3 agonist He will try oxybutynin 5 mg 3 times daily, discussed side effects such as dry eyes dry mouth and constipation Also check with his insurance company on drugs preferentially covered by his formulary - Urinalysis, Complete - BLADDER SCAN AMB NON-IMAGING   Return in about 6 months (around 03/21/2018) for [facility] IPSS/ PVR.  Hollice Espy, MD  [facility] [address] [place], [place] [address] [phone]  Addendum: PSA was noted to be elevated to 2.0 up from 0.8 the previous year.  We will keep a close eye on this, plan for repeat PSA in 6 months with [facility].

## 2017-09-19 LAB — PSA: Prostate Specific Ag, Serum: 2 ng/mL (ref 0.0–4.0)

## 2017-12-31 ENCOUNTER — Ambulatory Visit
Admission: RE | Admit: 2017-12-31 | Discharge: 2017-12-31 | Disposition: A | Discharge status: Home / Self Care | Payer: Medicare Other | Source: Ambulatory Visit | Attending: Unknown Physician Specialty | Admitting: Unknown Physician Specialty

## 2017-12-31 ENCOUNTER — Ambulatory Visit: Payer: Medicare Other | Admitting: Anesthesiology

## 2017-12-31 ENCOUNTER — Encounter
Admission: RE | Disposition: A | Discharge status: Home / Self Care | Payer: Self-pay | Source: Ambulatory Visit | Attending: Unknown Physician Specialty

## 2017-12-31 DIAGNOSIS — N400 Enlarged prostate without lower urinary tract symptoms: Secondary | ICD-10-CM | POA: Insufficient documentation

## 2017-12-31 DIAGNOSIS — Z85828 Personal history of other malignant neoplasm of skin: Secondary | ICD-10-CM | POA: Diagnosis not present

## 2017-12-31 DIAGNOSIS — N189 Chronic kidney disease, unspecified: Secondary | ICD-10-CM | POA: Insufficient documentation

## 2017-12-31 DIAGNOSIS — I2510 Atherosclerotic heart disease of native coronary artery without angina pectoris: Secondary | ICD-10-CM | POA: Diagnosis not present

## 2017-12-31 DIAGNOSIS — Z7901 Long term (current) use of anticoagulants: Secondary | ICD-10-CM | POA: Diagnosis not present

## 2017-12-31 DIAGNOSIS — Z8673 Personal history of transient ischemic attack (TIA), and cerebral infarction without residual deficits: Secondary | ICD-10-CM | POA: Diagnosis not present

## 2017-12-31 DIAGNOSIS — K219 Gastro-esophageal reflux disease without esophagitis: Secondary | ICD-10-CM | POA: Diagnosis not present

## 2017-12-31 DIAGNOSIS — Z79899 Other long term (current) drug therapy: Secondary | ICD-10-CM | POA: Insufficient documentation

## 2017-12-31 DIAGNOSIS — D122 Benign neoplasm of ascending colon: Secondary | ICD-10-CM | POA: Diagnosis not present

## 2017-12-31 DIAGNOSIS — I252 Old myocardial infarction: Secondary | ICD-10-CM | POA: Diagnosis not present

## 2017-12-31 DIAGNOSIS — K222 Esophageal obstruction: Secondary | ICD-10-CM | POA: Diagnosis not present

## 2017-12-31 DIAGNOSIS — R040 Epistaxis: Secondary | ICD-10-CM | POA: Diagnosis not present

## 2017-12-31 DIAGNOSIS — K449 Diaphragmatic hernia without obstruction or gangrene: Secondary | ICD-10-CM | POA: Insufficient documentation

## 2017-12-31 DIAGNOSIS — D120 Benign neoplasm of cecum: Secondary | ICD-10-CM | POA: Diagnosis not present

## 2017-12-31 DIAGNOSIS — Z79891 Long term (current) use of opiate analgesic: Secondary | ICD-10-CM | POA: Insufficient documentation

## 2017-12-31 DIAGNOSIS — Z791 Long term (current) use of non-steroidal anti-inflammatories (NSAID): Secondary | ICD-10-CM | POA: Insufficient documentation

## 2017-12-31 DIAGNOSIS — Z86010 Personal history of colonic polyps: Secondary | ICD-10-CM | POA: Diagnosis not present

## 2017-12-31 DIAGNOSIS — Z951 Presence of aortocoronary bypass graft: Secondary | ICD-10-CM | POA: Diagnosis not present

## 2017-12-31 DIAGNOSIS — Z87891 Personal history of nicotine dependence: Secondary | ICD-10-CM | POA: Insufficient documentation

## 2017-12-31 DIAGNOSIS — Q438 Other specified congenital malformations of intestine: Secondary | ICD-10-CM | POA: Insufficient documentation

## 2017-12-31 DIAGNOSIS — E7800 Pure hypercholesterolemia, unspecified: Secondary | ICD-10-CM | POA: Diagnosis not present

## 2017-12-31 DIAGNOSIS — I129 Hypertensive chronic kidney disease with stage 1 through stage 4 chronic kidney disease, or unspecified chronic kidney disease: Secondary | ICD-10-CM | POA: Insufficient documentation

## 2017-12-31 DIAGNOSIS — Z96651 Presence of right artificial knee joint: Secondary | ICD-10-CM | POA: Diagnosis not present

## 2017-12-31 DIAGNOSIS — K640 First degree hemorrhoids: Secondary | ICD-10-CM | POA: Insufficient documentation

## 2017-12-31 DIAGNOSIS — J449 Chronic obstructive pulmonary disease, unspecified: Secondary | ICD-10-CM | POA: Insufficient documentation

## 2017-12-31 DIAGNOSIS — D631 Anemia in chronic kidney disease: Secondary | ICD-10-CM | POA: Insufficient documentation

## 2017-12-31 DIAGNOSIS — Z905 Acquired absence of kidney: Secondary | ICD-10-CM | POA: Insufficient documentation

## 2017-12-31 HISTORY — DX: Unspecified osteoarthritis, unspecified site: M19.90

## 2017-12-31 HISTORY — PX: COLONOSCOPY WITH PROPOFOL: SHX5780

## 2017-12-31 HISTORY — DX: Acute myocardial infarction, unspecified: I21.9

## 2017-12-31 HISTORY — PX: ESOPHAGOGASTRODUODENOSCOPY: SHX5428

## 2017-12-31 HISTORY — DX: Cerebral infarction, unspecified: I63.9

## 2017-12-31 SURGERY — EGD (ESOPHAGOGASTRODUODENOSCOPY)
Anesthesia: General

## 2017-12-31 MED ORDER — PROPOFOL 500 MG/50ML IV EMUL
INTRAVENOUS | Status: DC | PRN
  Administered 2017-12-31: 120 ug/kg/min via INTRAVENOUS

## 2017-12-31 MED ORDER — LIDOCAINE HCL (CARDIAC) PF 100 MG/5ML IV SOSY
PREFILLED_SYRINGE | INTRAVENOUS | Status: DC | PRN
  Administered 2017-12-31: 100 mg via INTRAVENOUS

## 2017-12-31 MED ORDER — PIPERACILLIN-TAZOBACTAM 3.375 G IVPB
INTRAVENOUS | Status: AC
  Filled 2017-12-31: qty 50

## 2017-12-31 MED ORDER — SODIUM CHLORIDE 0.9 % IV SOLN
INTRAVENOUS | Status: DC
  Administered 2017-12-31: 13:00:00 via INTRAVENOUS

## 2017-12-31 MED ORDER — SODIUM CHLORIDE 0.9 % IV SOLN: INTRAVENOUS | Status: DC

## 2017-12-31 MED ORDER — PROPOFOL 10 MG/ML IV BOLUS
INTRAVENOUS | Status: DC | PRN
  Administered 2017-12-31: 100 mg via INTRAVENOUS

## 2017-12-31 MED ORDER — PIPERACILLIN-TAZOBACTAM 3.375 G IVPB
3.3750 g | Freq: Once | INTRAVENOUS | Status: AC
  Administered 2017-12-31: 3.375 g via INTRAVENOUS

## 2017-12-31 NOTE — OR Nursing (Signed)
See Dr. Bonnielee Haff notes regarding bleeding from the right nare during procedure.

## 2017-12-31 NOTE — Anesthesia Postprocedure Evaluation (Signed)
Anesthesia Post Note  Patient: MAEL DELAP  Procedure(s) Performed: ESOPHAGOGASTRODUODENOSCOPY (EGD) (N/A ) COLONOSCOPY WITH PROPOFOL (N/A )  Patient location during evaluation: Endoscopy Anesthesia Type: General Level of consciousness: awake and alert Pain management: pain level controlled Vital Signs Assessment: post-procedure vital signs reviewed and stable Respiratory status: spontaneous breathing, nonlabored ventilation and respiratory function stable Cardiovascular status: blood pressure returned to baseline and stable Postop Assessment: no apparent nausea or vomiting Anesthetic complications: no Comments: No nasal bleeding. Pt to call if any issues. Stable; doing well     Last Vitals:  Vitals:   12/31/17 1231 12/31/17 1420  BP: (!) 158/105 (!) 154/98  Pulse: 68   Resp: 20   Temp: (!) 35.8 C   SpO2: 100%     Last Pain:  Vitals:   12/31/17 1400  TempSrc:   PainSc: 0-No pain                 Alphonsus Sias

## 2017-12-31 NOTE — Anesthesia Post-op Follow-up Note (Signed)
Anesthesia QCDR form completed.        

## 2017-12-31 NOTE — H&P (Signed)
Primary Care Physician:  Tracie Harrier, MD Primary Gastroenterologist:  Dr. Vira Agar  Pre-Procedure History & Physical: HPI:  Craig [name] is a 72 y.o. male is here for an endoscopy and colonoscopy.  Anemia of chronic disease and hx of adenomatous polyps.   Past Medical History:  Diagnosis Date  . Anginal pain ([place])   . Arthritis   . BPH (benign prostatic hyperplasia)   . CAD (coronary artery disease)   . Cancer ([place])    skin cancer   . Chronic kidney disease    left kidney removed  . GERD (gastroesophageal reflux disease)   . History of hiatal hernia   . Hypercholesterolemia   . Hypertension   . Myocardial infarction ([place])   . Stroke [facility])     Past Surgical History:  Procedure Laterality Date  . COLONOSCOPY    . CORONARY ARTERY BYPASS GRAFT  2018   3 bypasses  . ESOPHAGOGASTRODUODENOSCOPY    . IR GENERIC HISTORICAL  09/29/2015   IR NEPHROSTOMY PLACEMENT LEFT 09/29/2015 [name], MD [facility]INTERV RAD  . IR GENERIC HISTORICAL  10/20/2015   IR NEPHROSTOGRAM LEFT THRU EXISTING ACCESS 10/20/2015 [facility]INTER[facility]  . JOINT REPLACEMENT     right knee  . KNEE ARTHROPLASTY Left 01/15/2017   Procedure: COMPUTER ASSISTED TOTAL KNEE ARTHROPLASTY;  Surgeon: Dereck Leep, MD;  Location: [facility] ORS;  Service: Orthopedics;  Laterality: Left;  . LAPAROSCOPIC NEPHRECTOMY, HAND ASSISTED Left 11/07/2015   Procedure: HAND ASSISTED LAPAROSCOPIC NEPHRECTOMY;  Surgeon: Hollice Espy, MD;  Location: [facility] ORS;  Service: Urology;  Laterality: Left;  . LEFT HEART CATH AND CORONARY ANGIOGRAPHY N/A 06/28/2016   Procedure: Left Heart Cath and Coronary Angiography;  Surgeon: Yolonda Kida, MD;  Location: [place] [facility];  Service: Cardiovascular;  Laterality: N/A;  . PROSTATE ABLATION N/A 04/10/2016   Procedure: PROSTATE ABLATION;  Surgeon: Hollice Espy, MD;  Location: [facility] ORS;  Service: Urology;  Laterality: N/A;  . REPLACEMENT TOTAL KNEE Right   . THULIUM LASER TURP (TRANSURETHRAL  RESECTION OF PROSTATE) N/A 04/10/2016   Procedure: THULIUM LASER TURP (TRANSURETHRAL RESECTION OF PROSTATE);  Surgeon: Hollice Espy, MD;  Location: [facility] ORS;  Service: Urology;  Laterality: N/A;  . TONSILLECTOMY      Prior to Admission medications   Medication Sig Start Date End Date Taking? Authorizing Provider  finasteride (PROSCAR) 5 MG tablet Take 5 mg at bedtime by mouth.  08/05/16  Yes [provider]  fluticasone (FLONASE) 50 MCG/ACT nasal spray Place 2 sprays daily into both nostrils.    Yes [provider]  omeprazole (PRILOSEC) 40 MG capsule Take 40 mg by mouth 2 (two) times daily.   Yes [provider]  tamsulosin (FLOMAX) 0.4 MG CAPS capsule Take 0.4 mg at bedtime by mouth.    Yes [provider]  traMADol (ULTRAM) 50 MG tablet Take 50 mg by mouth every 8 (eight) hours as needed for moderate pain.  11/17/15  Yes [provider]  vitamin E 400 UNIT capsule Take 800 Units daily by mouth.   Yes [provider]  acetaminophen (TYLENOL) 500 MG tablet Take 500 mg every 6 (six) hours as needed by mouth for moderate pain or headache.    [provider]  apixaban (ELIQUIS) 5 MG TABS tablet Take 5 mg 2 (two) times daily by mouth.    [provider]  atorvastatin (LIPITOR) 80 MG tablet Take 80 mg by mouth at bedtime.  08/05/16 08/05/17  [provider]  cetirizine (  ZYRTEC) 10 MG tablet Take 10 mg by mouth daily.    [provider]  docusate sodium (COLACE) 100 MG capsule Take 1 capsule (100 mg total) by mouth 2 (two) times daily. 03/10/17   Hollice Espy, MD  ferrous sulfate 325 (65 FE) MG tablet Take 325 mg by mouth daily with breakfast.    [provider]  FLUoxetine (PROZAC) 20 MG capsule Take 20 mg by mouth daily.  08/05/16 08/05/17  [provider]  gabapentin (NEURONTIN) 100 MG capsule Take 100 mg 2 (two) times daily by mouth.  08/07/16 08/07/17  [provider]  meloxicam  (MOBIC) 7.5 MG tablet Take 1 tablet (7.5 mg total) by mouth daily. Patient taking differently: Take 7.5 mg by mouth 2 (two) times daily. Take 1 tablet once a day for 3 days, then 1 tablet twice daily as instructed 07/17/17   Gardiner Barefoot, DPM  Multiple Vitamin (MULTIVITAMIN) tablet Take 1 tablet by mouth daily.    [provider]  oxybutynin (DITROPAN) 5 MG tablet Take 1 tablet (5 mg total) by mouth every 8 (eight) hours as needed for bladder spasms. Patient not taking: Reported on 12/31/2017 09/18/17   Hollice Espy, MD  traZODone (DESYREL) 50 MG tablet Take 50 mg by mouth at bedtime.    [provider]    Allergies as of 12/19/2017 - Review Complete 09/18/2017  Allergen Reaction Noted  . Peanut oil Other (See Comments) 07/01/2014  . Peanut-containing drug products Other (See Comments) 06/27/2016    Family History  Problem Relation Age of Onset  . Kidney cancer Mother   . Lung cancer Father   . Prostate cancer Neg Hx   . Bladder Cancer Neg Hx     Social History   Socioeconomic History  . Marital status: Married    Spouse name: Not on file  . Number of children: Not on file  . Years of education: Not on file  . Highest education level: Not on file  Occupational History  . Not on file  Social Needs  . Financial resource strain: Not on file  . Food insecurity:    Worry: Not on file    Inability: Not on file  . Transportation needs:    Medical: Not on file    Non-medical: Not on file  Tobacco Use  . Smoking status: Former Smoker    Packs/day: 1.50    Years: 20.00    Pack years: 30.00    Last attempt to quit: 09/25/1983    Years since quitting: 34.2  . Smokeless tobacco: Never Used  Substance and Sexual Activity  . Alcohol use: No  . Drug use: No  . Sexual activity: Not on file  Lifestyle  . Physical activity:    Days per week: Not on file    Minutes per session: Not on file  . Stress: Not on file  Relationships  . Social connections:    Talks  on phone: Not on file    Gets together: Not on file    Attends religious service: Not on file    Active member of club or organization: Not on file    Attends meetings of clubs or organizations: Not on file    Relationship status: Not on file  . Intimate partner violence:    Fear of current or ex partner: Not on file    Emotionally abused: Not on file    Physically abused: Not on file    Forced sexual activity: Not on file  Other Topics Concern  . Not on file  Social History Narrative  . Not on file    Review of Systems: See HPI, otherwise negative ROS  Physical Exam: BP (!) 158/105   Pulse 68   Temp (!) 96.4 F (35.8 C) (Tympanic)   Resp 20   Ht 5\' 11"  (1.803 m)   Wt 106.6 kg   SpO2 100%   BMI 32.78 kg/m  General:   Alert,  pleasant and cooperative in NAD Head:  Normocephalic and atraumatic. Neck:  Supple; no masses or thyromegaly. Lungs:  Clear throughout to auscultation.    Heart:  Regular rate and rhythm. Abdomen:  Soft, nontender and nondistended. Normal bowel sounds, without guarding, and without rebound.   Neurologic:  Alert and  oriented x4;  grossly normal neurologically.  Impression/Plan: ANDRAS GRUNEWALD is here for an endoscopy and colonoscopy to be performed for anemia of chronic disease and hx of colon polyps.  Risks, benefits, limitations, and alternatives regarding  endoscopy and colonoscopy have been reviewed with the patient.  Questions have been answered.  All parties agreeable.   Gaylyn Cheers, MD  12/31/2017, 12:56 PM

## 2017-12-31 NOTE — Transfer of Care (Signed)
Immediate Anesthesia Transfer of Care Note  Patient: Craig [name]  Procedure(s) Performed: ESOPHAGOGASTRODUODENOSCOPY (EGD) (N/A ) COLONOSCOPY WITH PROPOFOL (N/A )  Patient Location: PACU and Endoscopy Unit  Anesthesia Type:General  Level of Consciousness: awake  Airway & Oxygen Therapy: Patient Spontanous Breathing  Post-op Assessment: Report given to RN  Post vital signs: stable  Last Vitals:  Vitals Value Taken Time  BP 104/82 12/31/2017  1:51 PM  Temp    Pulse 75 12/31/2017  1:53 PM  Resp 14 12/31/2017  1:53 PM  SpO2 100 % 12/31/2017  1:53 PM  Vitals shown include unvalidated device data.  Last Pain:  Vitals:   12/31/17 1231  TempSrc: Tympanic  PainSc: 0-No pain         Complications: No apparent anesthesia complications. Pt had episode of epistaxis 2 to NT placement R nares. Bleeding stopped by arrival to recovery. Pt stable

## 2017-12-31 NOTE — Op Note (Signed)
[facility] Gastroenterology Patient Name: Craig [name] Procedure Date: 12/31/2017 12:55 PM MRN: [id number] Account #: [id number] Date of Birth: 1945-03-20 Admit Type: Outpatient Age: 72 Room: [facility] ENDO ROOM 2 Gender: Male Note Status: Finalized Procedure:            Upper GI endoscopy Indications:          Anemia of chronic disease Providers:            Manya Silvas, MD Referring MD:         Tracie Harrier, MD (Referring MD) Medicines:            Propofol per Anesthesia Complications:        No immediate complications. Procedure:            Pre-Anesthesia Assessment:                       - After reviewing the risks and benefits, the patient                        was deemed in satisfactory condition to undergo the                        procedure.                       After obtaining informed consent, the endoscope was                        passed under direct vision. Throughout the procedure,                        the patient's blood pressure, pulse, and oxygen                        saturations were monitored continuously. The Endoscope                        was introduced through the mouth, and advanced to the                        second part of duodenum. The upper GI endoscopy was                        accomplished without difficulty. The patient tolerated                        the procedure well. Findings:      A non-obstructing Schatzki ring was found at the gastroesophageal       junction. GEJ 40cm.      A medium-sized hiatal hernia was present.      The stomach was normal.      The examined duodenum was normal. Impression:           - Non-obstructing Schatzki ring.                       - Medium-sized hiatal hernia.                       - Normal stomach.                       -  Normal examined duodenum.                       - No specimens collected. Recommendation:       - Perform a colonoscopy as previously scheduled. Manya Silvas, MD 12/31/2017 1:12:59 PM This report has been signed electronically. Number of Addenda: 0 Note Initiated On: 12/31/2017 12:55 PM      [facility]

## 2017-12-31 NOTE — Op Note (Signed)
[facility] Gastroenterology Patient Name: Craig [name] Procedure Date: 12/31/2017 12:19 PM MRN: [id number] Account #: [id number] Date of Birth: Oct 01, 1945 Admit Type: Outpatient Age: 72 Room: [facility] ENDO ROOM 2 Gender: Male Note Status: Finalized Procedure:            Colonoscopy Indications:          High risk colon cancer surveillance: Personal history                        of colonic polyps Providers:            Manya Silvas, MD Referring MD:         Tracie Harrier, MD (Referring MD) Medicines:            Propofol per Anesthesia Complications:        No immediate complications. Procedure:            Pre-Anesthesia Assessment:                       - After reviewing the risks and benefits, the patient                        was deemed in satisfactory condition to undergo the                        procedure.                       After obtaining informed consent, the colonoscope was                        passed under direct vision. Throughout the procedure,                        the patient's blood pressure, pulse, and oxygen                        saturations were monitored continuously. The                        Colonoscope was introduced through the anus and                        advanced to the the cecum, identified by appendiceal                        orifice and ileocecal valve. The colonoscopy was                        somewhat difficult due to a redundant colon,                        significant looping and a tortuous colon. Findings:      Three sessile polyps were found in the ascending colon. The polyps were       diminutive in size. These polyps were removed with a cold snare.       Resection and retrieval were complete.      A medium polyp was found in the proximal ascending colon. The polyp was       sessile. The polyp was removed with a hot snare. Resection and retrieval  were complete.      Internal hemorrhoids were found  during endoscopy. The hemorrhoids were       small and Grade I (internal hemorrhoids that do not prolapse).      The exam was otherwise without abnormality.      He had bleeding from his nose and attention to this caused it to slow       down and stop bleeding. Impression:           - Three diminutive polyps in the ascending colon,                        removed with a cold snare. Resected and retrieved.                       - One medium polyp in the proximal ascending colon,                        removed with a hot snare. Resected and retrieved.                       - Internal hemorrhoids.                       - The examination was otherwise normal. Recommendation:       - The findings and recommendations were discussed with                        the patient. Manya Silvas, MD 12/31/2017 1:49:24 PM This report has been signed electronically. Number of Addenda: 0 Note Initiated On: 12/31/2017 12:19 PM Scope Withdrawal Time: 0 hours 11 minutes 3 seconds  Total Procedure Duration: 0 hours 23 minutes 41 seconds       [facility]

## 2017-12-31 NOTE — Anesthesia Preprocedure Evaluation (Addendum)
Anesthesia Evaluation  Patient identified by MRN, date of birth, ID band Patient awake    Reviewed: Allergy & Precautions, H&P , NPO status , reviewed documented beta blocker date and time   Airway Mallampati: III  TM Distance: >3 FB Neck ROM: limited    Dental  (+) Chipped, Missing, Partial Upper   Pulmonary COPD, former smoker,    Pulmonary exam normal        Cardiovascular hypertension, + angina + CAD and + Past MI  Normal cardiovascular exam  SB on EKG   Neuro/Psych CVA, No Residual Symptoms    GI/Hepatic hiatal hernia, GERD  Controlled and Medicated,  Endo/Other    Renal/GU Renal disease     Musculoskeletal  (+) Arthritis ,   Abdominal   Peds  Hematology  (+) Blood dyscrasia, anemia ,   Anesthesia Other Findings Past Medical History: No date: Anginal pain ([facility]) No date: BPH (benign prostatic hyperplasia) No date: CAD (coronary artery disease) No date: Cancer ([place])     Comment:  skin cancer  No date: Chronic kidney disease     Comment:  left kidney removed No date: GERD (gastroesophageal reflux disease) No date: History of hiatal hernia No date: Hypercholesterolemia No date: Hypertension  Past Surgical History: 2018: CORONARY ARTERY BYPASS GRAFT     Comment:  3 bypasses 09/29/2015: IR GENERIC HISTORICAL     Comment:  IR NEPHROSTOMY PLACEMENT LEFT 09/29/2015 [name], MD               [facility]INTERV RAD 10/20/2015: IR GENERIC HISTORICAL     Comment:  IR NEPHROSTOGRAM LEFT THRU EXISTING ACCESS 10/20/2015               [facility]INTER[facility] RAD No date: JOINT REPLACEMENT     Comment:  right knee 01/15/2017: KNEE ARTHROPLASTY; Left     Comment:  Procedure: COMPUTER ASSISTED TOTAL KNEE ARTHROPLASTY;                Surgeon: Dereck Leep, MD;  Location: [facility] ORS;                Service: Orthopedics;  Laterality: Left; 11/07/2015: LAPAROSCOPIC NEPHRECTOMY, HAND ASSISTED; Left     Comment:  Procedure: HAND ASSISTED  LAPAROSCOPIC NEPHRECTOMY;                Surgeon: Hollice Espy, MD;  Location: [facility] ORS;                Service: Urology;  Laterality: Left; 06/28/2016: LEFT HEART CATH AND CORONARY ANGIOGRAPHY; N/A     Comment:  Procedure: Left Heart Cath and Coronary Angiography;                Surgeon: Yolonda Kida, MD;  Location: [place]              CV LAB;  Service: Cardiovascular;  Laterality: N/A; 04/10/2016: PROSTATE ABLATION; N/A     Comment:  Procedure: PROSTATE ABLATION;  Surgeon: Hollice Espy,               MD;  Location: [facility] ORS;  Service: Urology;  Laterality:               N/A; No date: REPLACEMENT TOTAL KNEE; Right 04/10/2016: THULIUM LASER TURP (TRANSURETHRAL RESECTION OF PROSTATE);  N/A     Comment:  Procedure: THULIUM LASER TURP (TRANSURETHRAL RESECTION               OF PROSTATE);  Surgeon: Hollice Espy, MD;  Location:               [facility] ORS;  Service: Urology;  Laterality: N/A; No date: TONSILLECTOMY     Reproductive/Obstetrics                            Anesthesia Physical Anesthesia Plan  ASA: III  Anesthesia Plan: General   Post-op Pain Management:    Induction: Intravenous  PONV Risk Score and Plan: 2 and Treatment may vary due to age or medical condition and TIVA  Airway Management Planned: Nasal Cannula and Natural Airway  Additional Equipment:   Intra-op Plan:   Post-operative Plan:   Informed Consent: I have reviewed the patients History and Physical, chart, labs and discussed the procedure including the risks, benefits and alternatives for the proposed anesthesia with the patient or authorized representative who has indicated his/her understanding and acceptance.   Dental Advisory Given  Plan Discussed with:   Anesthesia Plan Comments:         Anesthesia Quick Evaluation

## 2018-01-01 ENCOUNTER — Encounter: Payer: Self-pay | Admitting: Unknown Physician Specialty

## 2018-01-02 LAB — SURGICAL PATHOLOGY

## 2018-03-23 ENCOUNTER — Ambulatory Visit: Payer: Medicare Other | Admitting: Urology

## 2018-03-25 ENCOUNTER — Ambulatory Visit (INDEPENDENT_AMBULATORY_CARE_PROVIDER_SITE_OTHER): Payer: Medicare Other | Admitting: Urology

## 2018-03-25 ENCOUNTER — Encounter: Payer: Self-pay | Admitting: Urology

## 2018-03-25 VITALS — BP 158/94 | HR 60 | Ht 71.0 in | Wt 235.0 lb

## 2018-03-25 DIAGNOSIS — R350 Frequency of micturition: Secondary | ICD-10-CM | POA: Diagnosis not present

## 2018-03-25 DIAGNOSIS — N138 Other obstructive and reflux uropathy: Secondary | ICD-10-CM

## 2018-03-25 DIAGNOSIS — N401 Enlarged prostate with lower urinary tract symptoms: Secondary | ICD-10-CM | POA: Diagnosis not present

## 2018-03-25 LAB — BLADDER SCAN AMB NON-IMAGING

## 2018-03-25 MED ORDER — FESOTERODINE FUMARATE ER 4 MG PO TB24: 4.0000 mg | ORAL_TABLET | Freq: Every day | ORAL | 0 refills | Status: DC

## 2018-03-25 NOTE — Progress Notes (Signed)
03/25/2018 9:27 AM   Craig [name] 07-20-1945 [id number]  Referring provider: Tracie Harrier, MD [address] [facility] [place], [place] [address]  Chief Complaint  Patient presents with  . Urinary Frequency    HPI: Craig [name] is a 73 yo M who returns for a 6 month f/u for the evaluation and management of BPH with obstruction/LUTS.   He reports of urinary frequency and difficulties with postponing urination starting since his kidney removal from years ago. He limits drinking water to compensate for frequency. He states that I he drinks 8oz of water he'll go to bathroom half a dozen times in an hour. He stopped drinking coffee. He drinks a couple glasses of tea a week. He drinks diet coke, flavored water, and Sunkist throughout the day. He discontinued drinking juices due to increased sodium intake and denies drinking alcohol.   He reports that the Myrbetriq samples were not effective and its cost prohibitive. He has not started oxybutynin. He remains on both finasteride and Flomax.   His I PSS is 16/3 and PVR is 24 mL.  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 Surgery was uncomplicated. 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 No malignancy was identified.   PSA trend Component     Latest Ref Rng & Units 12/06/2015 09/18/2017  Prostate Specific Ag, Serum     0.0 - 4.0 ng/mL 0.8 2.0     IPSS    Row Name 03/25/18 0800         International Prostate Symptom Score   How often have you had the sensation of not emptying your bladder?  About half the time     How often have you had to urinate less  than every two hours?  Almost always     How often have you found you stopped and started again several times when you urinated?  Less than half the time     How often have you found it difficult to postpone urination?  About half the time     How often have you had a weak urinary stream?  Not at All     How often have you had to strain to start urination?  Less than 1 in 5 times     How many times did you typically get up at night to urinate?  2 Times     Total IPSS Score  16       Quality of Life due to urinary symptoms   If you were to spend the rest of your life with your urinary condition just the way it is now how would you feel about that?  Mixed        Score:  1-7 Mild 8-19 Moderate 20-35 Severe   PMH: Past Medical History:  Diagnosis Date  . Anginal pain ([place])   . Arthritis   . BPH (benign prostatic hyperplasia)   . CAD (coronary artery disease)   . Cancer ([place])    skin cancer   . Chronic kidney disease    left kidney removed  . GERD (gastroesophageal reflux disease)   . History of hiatal hernia   . Hypercholesterolemia   . Hypertension   . Myocardial infarction (  [place])   . Stroke [facility])     Surgical History: Past Surgical History:  Procedure Laterality Date  . COLONOSCOPY    . COLONOSCOPY WITH PROPOFOL N/A 12/31/2017   Procedure: COLONOSCOPY WITH PROPOFOL;  Surgeon: Manya Silvas, MD;  Location: [facility] ENDOSCOPY;  Service: Endoscopy;  Laterality: N/A;  . CORONARY ARTERY BYPASS GRAFT  2018   3 bypasses  . ESOPHAGOGASTRODUODENOSCOPY    . ESOPHAGOGASTRODUODENOSCOPY N/A 12/31/2017   Procedure: ESOPHAGOGASTRODUODENOSCOPY (EGD);  Surgeon: Manya Silvas, MD;  Location: [facility] ENDOSCOPY;  Service: Endoscopy;  Laterality: N/A;  . IR GENERIC HISTORICAL  09/29/2015   IR NEPHROSTOMY PLACEMENT LEFT 09/29/2015 [name], MD [facility]-INTERV RAD  . IR GENERIC HISTORICAL  10/20/2015   IR NEPHROSTOGRAM LEFT THRU EXISTING ACCESS 10/20/2015 [facility]-INTERV RAD  . JOINT REPLACEMENT       right knee  . KNEE ARTHROPLASTY Left 01/15/2017   Procedure: COMPUTER ASSISTED TOTAL KNEE ARTHROPLASTY;  Surgeon: Dereck Leep, MD;  Location: [facility] ORS;  Service: Orthopedics;  Laterality: Left;  . LAPAROSCOPIC NEPHRECTOMY, HAND ASSISTED Left 11/07/2015   Procedure: HAND ASSISTED LAPAROSCOPIC NEPHRECTOMY;  Surgeon: Hollice Espy, MD;  Location: [facility] ORS;  Service: Urology;  Laterality: Left;  . LEFT HEART CATH AND CORONARY ANGIOGRAPHY N/A 06/28/2016   Procedure: Left Heart Cath and Coronary Angiography;  Surgeon: Yolonda Kida, MD;  Location: [place] CV LAB;  Service: Cardiovascular;  Laterality: N/A;  . PROSTATE ABLATION N/A 04/10/2016   Procedure: PROSTATE ABLATION;  Surgeon: Hollice Espy, MD;  Location: [facility] ORS;  Service: Urology;  Laterality: N/A;  . REPLACEMENT TOTAL KNEE Right   . THULIUM LASER TURP (TRANSURETHRAL RESECTION OF PROSTATE) N/A 04/10/2016   Procedure: THULIUM LASER TURP (TRANSURETHRAL RESECTION OF PROSTATE);  Surgeon: Hollice Espy, MD;  Location: [facility] ORS;  Service: Urology;  Laterality: N/A;  . TONSILLECTOMY      Home Medications:  Allergies as of 03/25/2018      Reactions   Peanut Oil Other (See Comments)   Mouth ulcers Mouth ulcers   Peanut-containing Drug Products Other (See Comments)   Mouth ulcers       Medication List       Accurate as of March 25, 2018  9:27 AM. Always use your most recent med list.        acetaminophen 500 MG tablet Commonly known as:  TYLENOL Take 500 mg every 6 (six) hours as needed by mouth for moderate pain or headache.   amiodarone 200 MG tablet Commonly known as:  PACERONE   atorvastatin 80 MG tablet Commonly known as:  LIPITOR Take 80 mg by mouth at bedtime.   cetirizine 10 MG tablet Commonly known as:  ZYRTEC Take 10 mg by mouth daily.   docusate sodium 100 MG capsule Commonly known as:  COLACE Take 1 capsule (100 mg total) by mouth 2 (two) times daily.   ELIQUIS 5 MG Tabs tablet Generic drug:   apixaban Take 5 mg 2 (two) times daily by mouth.   ferrous sulfate 325 (65 FE) MG tablet Take 325 mg by mouth daily with breakfast.   finasteride 5 MG tablet Commonly known as:  PROSCAR Take 5 mg at bedtime by mouth.   FLUoxetine 20 MG capsule Commonly known as:  PROZAC Take 20 mg by mouth daily.   fluticasone 50 MCG/ACT nasal spray Commonly known as:  FLONASE Place 2 sprays daily into both nostrils.   gabapentin 100 MG capsule Commonly known as:  NEURONTIN Take 100 mg 2 (two) times daily by  mouth.   hydrochlorothiazide 12.5 MG tablet Commonly known as:  HYDRODIURIL   lisinopril 40 MG tablet Commonly known as:  PRINIVIL,ZESTRIL Take by mouth.   meloxicam 7.5 MG tablet Commonly known as:  MOBIC Take 1 tablet (7.5 mg total) by mouth daily.   multivitamin tablet Take 1 tablet by mouth daily.   omeprazole 40 MG capsule Commonly known as:  PRILOSEC Take 40 mg by mouth 2 (two) times daily.   oxybutynin 5 MG tablet Commonly known as:  DITROPAN Take 1 tablet (5 mg total) by mouth every 8 (eight) hours as needed for bladder spasms.   tamsulosin 0.4 MG Caps capsule Commonly known as:  FLOMAX Take 0.4 mg at bedtime by mouth.   traMADol 50 MG tablet Commonly known as:  ULTRAM Take 50 mg by mouth every 8 (eight) hours as needed for moderate pain.   traZODone 50 MG tablet Commonly known as:  DESYREL Take 50 mg by mouth at bedtime.   vitamin E 400 UNIT capsule Take 800 Units daily by mouth.       Allergies:  Allergies  Allergen Reactions  . Peanut Oil Other (See Comments)    Mouth ulcers  Mouth ulcers  . Peanut-Containing Drug Products Other (See Comments)    Mouth ulcers     Family History: Family History  Problem Relation Age of Onset  . Kidney cancer Mother   . Lung cancer Father   . Prostate cancer Neg Hx   . Bladder Cancer Neg Hx     Social History:  reports that he quit smoking about 34 years ago. He has a 30.00 pack-year smoking history. He  has never used smokeless tobacco. He reports that he does not drink alcohol or use drugs.  ROS: UROLOGY Frequent Urination?: Yes Hard to postpone urination?: Yes Burning/pain with urination?: No Get up at night to urinate?: No Leakage of urine?: No Urine stream starts and stops?: No Trouble starting stream?: No Do you have to strain to urinate?: No Blood in urine?: No Urinary tract infection?: No Sexually transmitted disease?: No Injury to kidneys or bladder?: No Painful intercourse?: No Weak stream?: No Erection problems?: No Penile pain?: No  Gastrointestinal Nausea?: No Vomiting?: No Indigestion/heartburn?: No Diarrhea?: No Constipation?: No  Constitutional Fever: No Night sweats?: No Weight loss?: No Fatigue?: No  Skin Skin rash/lesions?: No Itching?: No  Eyes Blurred vision?: No Double vision?: No  Ears/Nose/Throat Sore throat?: No Sinus problems?: No  Hematologic/Lymphatic Swollen glands?: No Easy bruising?: No  Cardiovascular Leg swelling?: No Chest pain?: No  Respiratory Cough?: No Shortness of breath?: No  Endocrine Excessive thirst?: No  Musculoskeletal Back pain?: No Joint pain?: No  Neurological Headaches?: No Dizziness?: No  Psychologic Depression?: No Anxiety?: No  Physical Exam: BP (!) 158/94 (BP Location: Left Arm, Patient Position: Sitting)   Pulse 60   Ht 5\' 11"  (1.803 m)   Wt 235 lb (106.6 kg)   BMI 32.78 kg/m   Constitutional:  Well nourished. Alert and oriented, No acute distress. HEENT: [place] AT, moist mucus membranes.  Trachea midline, no masses. Cardiovascular: No clubbing, cyanosis, or edema. Respiratory: Normal respiratory effort, no increased work of breathing. GU: No CVA tenderness.  No bladder fullness or masses.  Patient with uncircumcised phallus. Foreskin easily retracted  Urethral meatus is patent.  No penile discharge. No penile lesions or rashes. Scrotum without lesions, cysts, rashes and/or edema.   Testicles are located scrotally bilaterally. No masses are appreciated in the testicles. Left and right epididymis  are normal. Rectal: Patient with  normal sphincter tone. Anus and perineum without scarring or rashes. No rectal masses are appreciated. Prostate is approximately 45 grams, right lobe firm with no distinct nodules appreciated and left side have no nodules appreciated. Seminal vesicles are normal. Right lobe firm no distinct nodule. Left side has no nodules.  Skin: No rashes, bruises or suspicious lesions. Neurologic: Grossly intact, no focal deficits, moving all 4 extremities. Psychiatric: Normal mood and affect.  Laboratory Data: Component     Latest Ref Rng & Units 12/06/2015 09/18/2017  Prostate Specific Ag, Serum     0.0 - 4.0 ng/mL 0.8 2.0    Pertinent Imaging: Results for orders placed or performed in visit on 03/25/18  BLADDER SCAN AMB NON-IMAGING  Result Value Ref Range   Scan Result 35ml    Assessment & Plan:    1. BPH with obstruction/lower urinary tract symptoms -Obstructive urinary symptoms stable on finasteride and Flomax -Continue finasteride and Flomax - I PSS is 16/3  -Adequate bladder emptying today; PVR is 24 mL  -PSA today - pending; will call with results   2. Urinary frequency -Persistent irritative voiding symptoms without hematuria or dysuria -Samples of Myrbetriq was not effective; cost prohibitive  - He will try Toviaz 4 mg 3 times daily, discussed side effects such as dry eyes, dry mouth and constipation; if effective will switch to oxybutynin which is covered by his insurance company -Given 4 weeks of Toviaz 4 mg  -Also check with his insurance company on drugs preferentially covered by his formulary -RTC in 3 weeks for I PSS and PVR   Return in about 3 weeks (around 04/15/2018) for I PSS and PVR .  Zara Council, PA-C  [facility] [address], [place], [place] [address] [phone]  I,  Lucas Mallow, am acting as a Education administrator for [employer],  I have reviewed the above documentation for accuracy and completeness, and I agree with the above.    Zara Council, PA-C

## 2018-03-26 LAB — PSA: Prostate Specific Ag, Serum: 0.7 ng/mL (ref 0.0–4.0)

## 2018-04-16 ENCOUNTER — Ambulatory Visit (INDEPENDENT_AMBULATORY_CARE_PROVIDER_SITE_OTHER): Payer: Medicare Other | Admitting: Urology

## 2018-04-16 ENCOUNTER — Encounter: Payer: Self-pay | Admitting: Urology

## 2018-04-16 VITALS — BP 121/78 | HR 76 | Ht 71.0 in | Wt 236.0 lb

## 2018-04-16 DIAGNOSIS — R350 Frequency of micturition: Secondary | ICD-10-CM

## 2018-04-16 DIAGNOSIS — N138 Other obstructive and reflux uropathy: Secondary | ICD-10-CM | POA: Diagnosis not present

## 2018-04-16 DIAGNOSIS — N401 Enlarged prostate with lower urinary tract symptoms: Secondary | ICD-10-CM | POA: Diagnosis not present

## 2018-04-16 LAB — BLADDER SCAN AMB NON-IMAGING

## 2018-04-16 MED ORDER — FESOTERODINE FUMARATE ER 4 MG PO TB24: 4.0000 mg | ORAL_TABLET | Freq: Every day | ORAL | 0 refills | Status: DC

## 2018-04-16 MED ORDER — FESOTERODINE FUMARATE ER 4 MG PO TB24: 4.0000 mg | ORAL_TABLET | Freq: Every day | ORAL | 3 refills | Status: DC

## 2018-04-16 NOTE — Progress Notes (Signed)
04/16/2018 11:09 AM   [name] Aug 04, 1945 [id number]  Referring provider: Tracie Harrier, MD [address] [facility] [place], [place] [address]  Chief Complaint  Patient presents with  . Urinary Frequency    HPI: Craig [name] is a 73 yo M who returns for a 6 month f/u for the evaluation and management of BPH with obstruction/LUTS.   He reports of urinary frequency and difficulties with postponing urination starting since his kidney removal from years ago. He limits drinking water to compensate for frequency. He states that I he drinks 8oz of water he'll go to bathroom half a dozen times in an hour. He stopped drinking coffee. He drinks a couple glasses of tea a week. He drinks diet coke, flavored water, and Sunkist throughout the day. He discontinued drinking juices due to increased sodium intake and denies drinking alcohol.   He reports that the Myrbetriq samples were not effective and its cost prohibitive. He has not started oxybutynin. He remains on both finasteride and Flomax.   His I PSS is 16/3 and PVR is 10. His previous I PSS and PVR were 16/3 and 24 mL, respectively. His PSA is 0.7.   He reports of feeling great overall and does not go to bathroom as much. He denies getting sideeffects such as dry eyes or constipation. He is currently using Toviaz and reports it as being effective. He did have a dry mouth the first couple of days but disappeared after.   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 Surgery was uncomplicated. 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 No malignancy was identified.     PSA trend Component     Latest Ref Rng & Units 12/06/2015 09/18/2017 03/25/2018  Prostate Specific Ag, Serum     0.0 - 4.0 ng/mL 0.8 2.0 0.7    IPSS    Row Name 04/16/18 1000         International Prostate Symptom Score   How often have you had the sensation of not emptying your bladder?  Less than half the time     How often have you had to urinate less than every two hours?  Almost always     How often have you found you stopped and started again several times when you urinated?  Less than half the time     How often have you found it difficult to postpone urination?  Less than half the time     How often have you had a weak urinary stream?  Less than half the time     How often have you had to strain to start urination?  Less than 1 in 5 times     How many times did you typically get up at night to urinate?  2 Times     Total IPSS Score  16       Quality of Life due to urinary symptoms   If you were to spend the rest of your life with your urinary condition just the way it is now how would you feel about that?  Mixed       Score:  1-7 Mild 8-19 Moderate 20-35 Severe  PMH: Past Medical History:  Diagnosis Date  .  Anginal pain ([place])   . Arthritis   . BPH (benign prostatic hyperplasia)   . CAD (coronary artery disease)   . Cancer ([place])    skin cancer   . Chronic kidney disease    left kidney removed  . GERD (gastroesophageal reflux disease)   . History of hiatal hernia   . Hypercholesterolemia   . Hypertension   . Myocardial infarction ([place])   . Stroke [facility])     Surgical History: Past Surgical History:  Procedure Laterality Date  . COLONOSCOPY    . COLONOSCOPY WITH PROPOFOL N/A 12/31/2017   Procedure: COLONOSCOPY WITH PROPOFOL;  Surgeon: Manya Silvas, MD;  Location: [facility] ENDOSCOPY;  Service: Endoscopy;  Laterality: N/A;  . CORONARY ARTERY BYPASS GRAFT  2018   3 bypasses  . ESOPHAGOGASTRODUODENOSCOPY    . ESOPHAGOGASTRODUODENOSCOPY N/A 12/31/2017    Procedure: ESOPHAGOGASTRODUODENOSCOPY (EGD);  Surgeon: Manya Silvas, MD;  Location: [facility] ENDOSCOPY;  Service: Endoscopy;  Laterality: N/A;  . IR GENERIC HISTORICAL  09/29/2015   IR NEPHROSTOMY PLACEMENT LEFT 09/29/2015 [name], MD [facility]-INTERV RAD  . IR GENERIC HISTORICAL  10/20/2015   IR NEPHROSTOGRAM LEFT THRU EXISTING ACCESS 10/20/2015 [facility]INTER[facility] RAD  . JOINT REPLACEMENT     right knee  . KNEE ARTHROPLASTY Left 01/15/2017   Procedure: COMPUTER ASSISTED TOTAL KNEE ARTHROPLASTY;  Surgeon: Dereck Leep, MD;  Location: [facility] ORS;  Service: Orthopedics;  Laterality: Left;  . LAPAROSCOPIC NEPHRECTOMY, HAND ASSISTED Left 11/07/2015   Procedure: HAND ASSISTED LAPAROSCOPIC NEPHRECTOMY;  Surgeon: Hollice Espy, MD;  Location: [facility] ORS;  Service: Urology;  Laterality: Left;  . LEFT HEART CATH AND CORONARY ANGIOGRAPHY N/A 06/28/2016   Procedure: Left Heart Cath and Coronary Angiography;  Surgeon: Yolonda Kida, MD;  Location: [place] CV LAB;  Service: Cardiovascular;  Laterality: N/A;  . PROSTATE ABLATION N/A 04/10/2016   Procedure: PROSTATE ABLATION;  Surgeon: Hollice Espy, MD;  Location: [facility] ORS;  Service: Urology;  Laterality: N/A;  . REPLACEMENT TOTAL KNEE Right   . THULIUM LASER TURP (TRANSURETHRAL RESECTION OF PROSTATE) N/A 04/10/2016   Procedure: THULIUM LASER TURP (TRANSURETHRAL RESECTION OF PROSTATE);  Surgeon: Hollice Espy, MD;  Location: [facility] ORS;  Service: Urology;  Laterality: N/A;  . TONSILLECTOMY      Home Medications:  Allergies as of 04/16/2018      Reactions   Peanut Oil Other (See Comments)   Mouth ulcers Mouth ulcers   Peanut-containing Drug Products Other (See Comments)   Mouth ulcers       Medication List       Accurate as of April 16, 2018 11:09 AM. Always use your most recent med list.        acetaminophen 500 MG tablet Commonly known as:  TYLENOL Take 500 mg every 6 (six) hours as needed by mouth for moderate pain or headache.   amiodarone  200 MG tablet Commonly known as:  PACERONE   atorvastatin 80 MG tablet Commonly known as:  LIPITOR Take 80 mg by mouth at bedtime.   atorvastatin 80 MG tablet Commonly known as:  LIPITOR   cetirizine 10 MG tablet Commonly known as:  ZYRTEC Take 10 mg by mouth daily.   docusate sodium 100 MG capsule Commonly known as:  COLACE Take 1 capsule (100 mg total) by mouth 2 (two) times daily.   ELIQUIS 5 MG Tabs tablet Generic drug:  apixaban Take 5 mg 2 (two) times daily by mouth.   ferrous sulfate 325 (65 FE) MG tablet Take 325 mg  by mouth daily with breakfast.   fesoterodine 4 MG Tb24 tablet Commonly known as:  TOVIAZ Take 1 tablet (4 mg total) by mouth daily.   fesoterodine 4 MG Tb24 tablet Commonly known as:  TOVIAZ Take 1 tablet (4 mg total) by mouth daily.   finasteride 5 MG tablet Commonly known as:  PROSCAR Take 5 mg at bedtime by mouth.   FLUoxetine 20 MG capsule Commonly known as:  PROZAC Take 20 mg by mouth daily.   fluticasone 50 MCG/ACT nasal spray Commonly known as:  FLONASE Place 2 sprays daily into both nostrils.   gabapentin 100 MG capsule Commonly known as:  NEURONTIN Take 100 mg 2 (two) times daily by mouth.   gabapentin 100 MG capsule Commonly known as:  NEURONTIN   hydrochlorothiazide 12.5 MG tablet Commonly known as:  HYDRODIURIL   lisinopril 40 MG tablet Commonly known as:  PRINIVIL,ZESTRIL Take by mouth.   meloxicam 7.5 MG tablet Commonly known as:  MOBIC Take 1 tablet (7.5 mg total) by mouth daily.   multivitamin tablet Take 1 tablet by mouth daily.   omeprazole 40 MG capsule Commonly known as:  PRILOSEC Take 40 mg by mouth 2 (two) times daily.   oxybutynin 5 MG tablet Commonly known as:  DITROPAN Take 1 tablet (5 mg total) by mouth every 8 (eight) hours as needed for bladder spasms.   tamsulosin 0.4 MG Caps capsule Commonly known as:  FLOMAX Take 0.4 mg at bedtime by mouth.   tiZANidine 2 MG tablet Commonly known as:   ZANAFLEX   traMADol 50 MG tablet Commonly known as:  ULTRAM Take 50 mg by mouth every 8 (eight) hours as needed for moderate pain.   traZODone 50 MG tablet Commonly known as:  DESYREL Take 50 mg by mouth at bedtime.   vitamin E 400 UNIT capsule Take 800 Units daily by mouth.       Allergies:  Allergies  Allergen Reactions  . Peanut Oil Other (See Comments)    Mouth ulcers  Mouth ulcers  . Peanut-Containing Drug Products Other (See Comments)    Mouth ulcers     Family History: Family History  Problem Relation Age of Onset  . Kidney cancer Mother   . Lung cancer Father   . Prostate cancer Neg Hx   . Bladder Cancer Neg Hx     Social History:  reports that he quit smoking about 34 years ago. He has a 30.00 pack-year smoking history. He has never used smokeless tobacco. He reports that he does not drink alcohol or use drugs.  ROS: UROLOGY Frequent Urination?: Yes Hard to postpone urination?: No Burning/pain with urination?: No Get up at night to urinate?: No Leakage of urine?: No Urine stream starts and stops?: No Trouble starting stream?: No Do you have to strain to urinate?: No Blood in urine?: No Urinary tract infection?: No Sexually transmitted disease?: No Injury to kidneys or bladder?: No Painful intercourse?: No Weak stream?: No Erection problems?: No Penile pain?: No  Gastrointestinal Nausea?: No Vomiting?: No Indigestion/heartburn?: No Diarrhea?: No Constipation?: No  Constitutional Fever: No Night sweats?: No Weight loss?: No Fatigue?: No  Skin Skin rash/lesions?: No Itching?: No  Eyes Blurred vision?: No Double vision?: No  Ears/Nose/Throat Sore throat?: No Sinus problems?: No  Hematologic/Lymphatic Swollen glands?: No Easy bruising?: No  Cardiovascular Leg swelling?: No Chest pain?: No  Respiratory Cough?: No Shortness of breath?: No  Endocrine Excessive thirst?: No  Musculoskeletal Back pain?: No Joint pain?:  No  Neurological Headaches?: No Dizziness?: No  Psychologic Depression?: No Anxiety?: No  Physical Exam: BP 121/78 (BP Location: Left Arm, Patient Position: Sitting)   Pulse 76   Ht 5\' 11"  (1.803 m)   Wt 236 lb (107 kg)   BMI 32.92 kg/m   Constitutional:  Well nourished. Alert and oriented, No acute distress. HEENT: [place] AT, moist mucus membranes.  Trachea midline, no masses. Cardiovascular: No clubbing, cyanosis, or edema. Respiratory: Normal respiratory effort, no increased work of breathing. Neurologic: Grossly intact, no focal deficits, moving all 4 extremities. Psychiatric: Normal mood and affect.  Laboratory Data: Component     Latest Ref Rng & Units 12/06/2015 09/18/2017 03/25/2018  Prostate Specific Ag, Serum     0.0 - 4.0 ng/mL 0.8 2.0 0.7   Pertinent Imaging: Results for orders placed or performed in visit on 04/16/18  BLADDER SCAN AMB NON-IMAGING  Result Value Ref Range   Scan Result 48ml     Assessment & Plan:    1. BPH with obstruction/lower urinary tract symptoms -Obstructive urinary symptoms stable on finasteride and Flomax -Continue finasteride and Flomax, improvement in urinary symptoms  - I PSS is 16/3 stable -Adequate bladder emptying today; PVR is 10 mL, improved -Most recent PSA 0.7, 03/25/2018    2. Urinary frequency -Persistent irritative voiding symptoms without hematuria or dysuria -Samples of Myrbetriq was not effective; cost prohibitive  -Currently using Toviaz 4 mg, effective, discussed side effects such as dry eyes, dry mouth and constipation; refills given  -Also check with his insurance company on drugs preferentially covered by his formulary -RTC in 1 year for I PSS, PSA, PVR and exam   Return in about 1 year (around 04/17/2019) for I PSS, PSA, PVR and exam .  [facility] [address] [place], [place] [address] [phone]  I, Lucas Mallow, am acting as a Education administrator for  [employer],  I have reviewed the above documentation for accuracy and completeness, and I agree with the above.    Zara Council, PA-C

## 2018-08-26 ENCOUNTER — Other Ambulatory Visit: Payer: Self-pay | Admitting: Urology

## 2018-09-03 IMAGING — DX DG KNEE 1-2V PORT*L*
2 series · 2 of 2 positions shown · non-contrast
Comparison: MRI 02/07/2010

CLINICAL DATA: S/p TKR

EXAM:
PORTABLE LEFT KNEE - 1-2 VIEW

[knee ap]
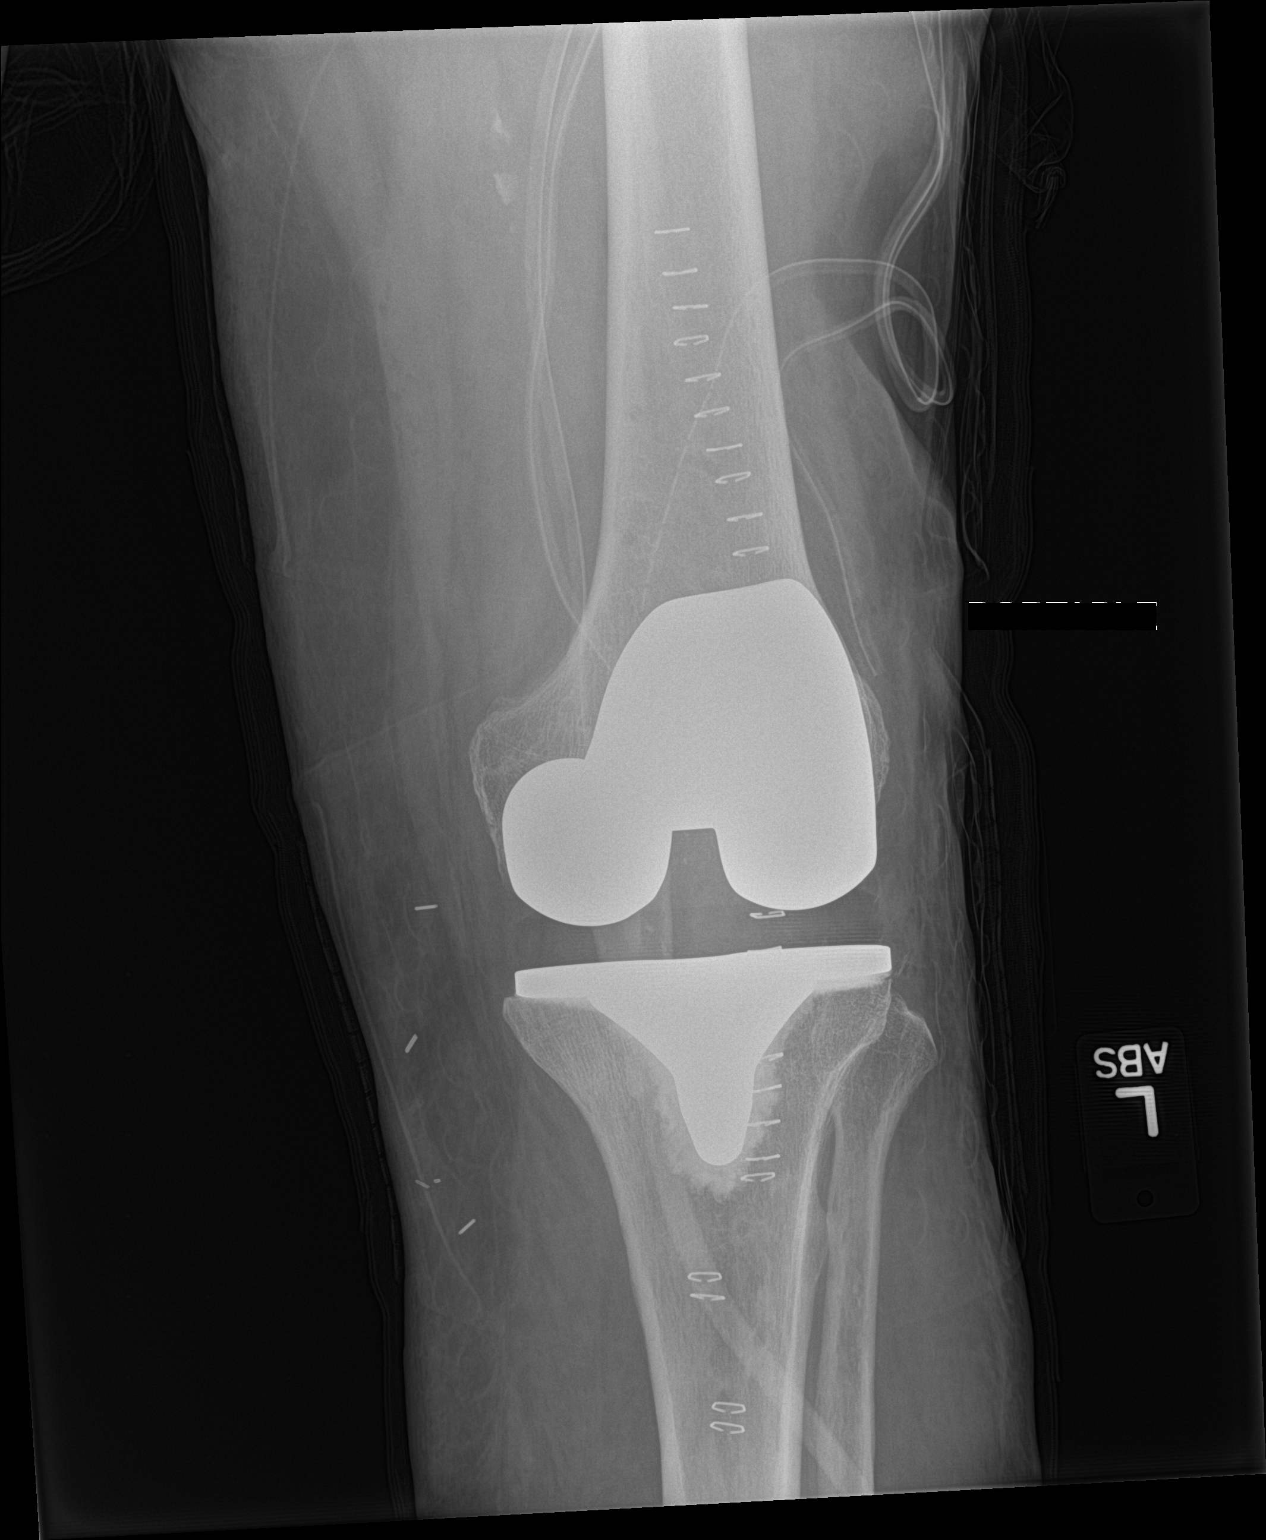

[knee lat]
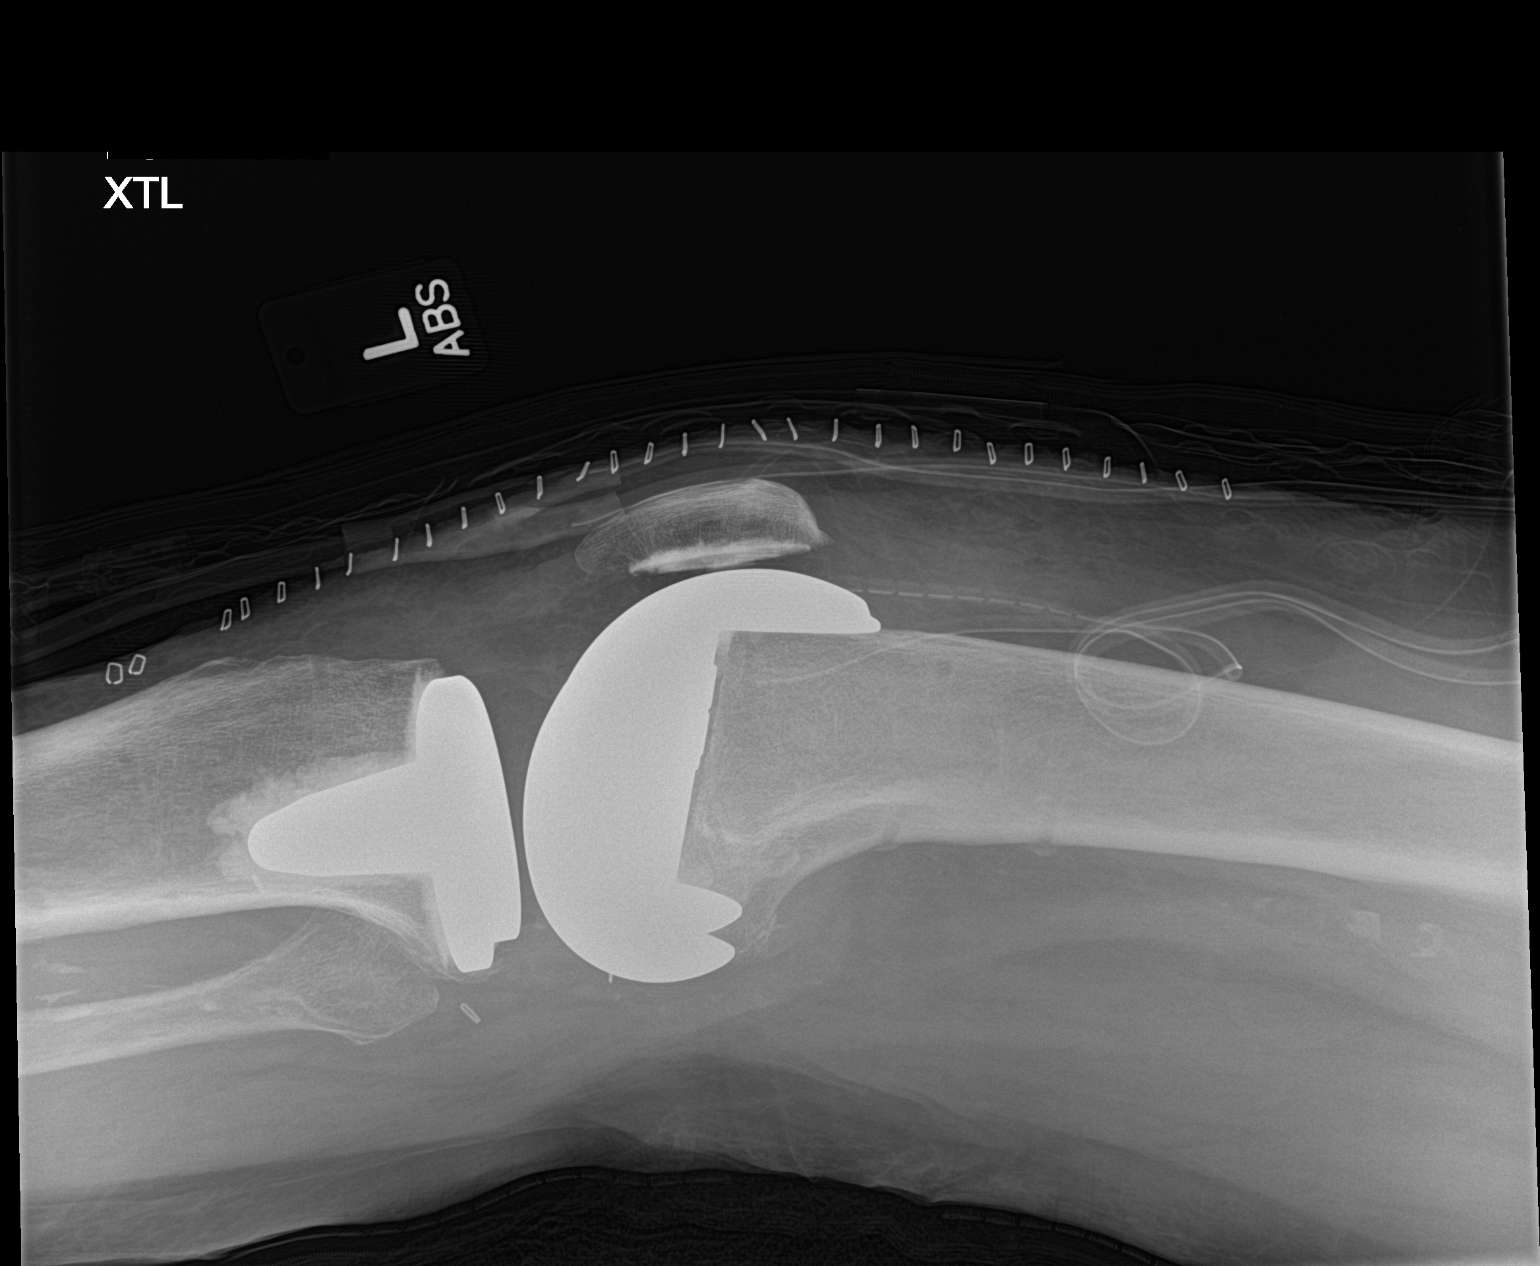

[2 of 2 positions shown; findings below may reference images not displayed]

FINDINGS: Status post total knee arthroplasty. Surgical clips overlie the
anterior aspect of the knee. A drain is in place in the
suprapatellar bursa. No evidence for dislocation or interval
fracture. There is atherosclerotic calcification of popliteal
artery.
IMPRESSION: No adverse features following total knee arthroplasty.

## 2018-10-22 ENCOUNTER — Other Ambulatory Visit: Payer: Self-pay | Admitting: Urology

## 2019-04-20 ENCOUNTER — Other Ambulatory Visit: Payer: Self-pay

## 2019-04-20 DIAGNOSIS — N138 Other obstructive and reflux uropathy: Secondary | ICD-10-CM

## 2019-04-21 ENCOUNTER — Other Ambulatory Visit: Payer: Medicare Other

## 2019-04-22 ENCOUNTER — Other Ambulatory Visit: Payer: Medicare Other

## 2019-04-22 ENCOUNTER — Other Ambulatory Visit: Payer: Self-pay

## 2019-04-22 DIAGNOSIS — N138 Other obstructive and reflux uropathy: Secondary | ICD-10-CM

## 2019-04-23 ENCOUNTER — Encounter: Payer: Self-pay | Admitting: Urology

## 2019-04-23 ENCOUNTER — Ambulatory Visit (INDEPENDENT_AMBULATORY_CARE_PROVIDER_SITE_OTHER): Payer: Medicare Other | Admitting: Urology

## 2019-04-23 ENCOUNTER — Other Ambulatory Visit: Payer: Self-pay | Admitting: Family Medicine

## 2019-04-23 VITALS — BP 141/81 | HR 66 | Ht 71.0 in | Wt 235.0 lb

## 2019-04-23 DIAGNOSIS — N138 Other obstructive and reflux uropathy: Secondary | ICD-10-CM

## 2019-04-23 DIAGNOSIS — R350 Frequency of micturition: Secondary | ICD-10-CM | POA: Diagnosis not present

## 2019-04-23 DIAGNOSIS — N401 Enlarged prostate with lower urinary tract symptoms: Secondary | ICD-10-CM

## 2019-04-23 DIAGNOSIS — Q600 Renal agenesis, unilateral: Secondary | ICD-10-CM | POA: Diagnosis not present

## 2019-04-23 DIAGNOSIS — IMO0002 Reserved for concepts with insufficient information to code with codable children: Secondary | ICD-10-CM

## 2019-04-23 LAB — BLADDER SCAN AMB NON-IMAGING: Scan Result: 0

## 2019-04-23 LAB — PSA: Prostate Specific Ag, Serum: 0.8 ng/mL (ref 0.0–4.0)

## 2019-04-23 NOTE — Progress Notes (Signed)
04/16/2018 10:52 AM   Craig [name] 07/31/1945 [id number]  Referring provider: Tracie Harrier, MD [address] [facility] [place],  [place] [address]  Chief Complaint  Patient presents with  . Urinary Frequency    HPI: Craig [name] is a 74 yo M with a solitary right kidney, BPH with LU TS with frequency who returns for a 12 month f/u.    Solitary right kidney Due to a chronic left UPJ obstruction that was complicated by infection, severe hydronephrosis associated with renal atrophy and pain, he underwent a hand-assisted laparoscopic nephrectomy on 10/06/2015.  Pathology was benign.  Serum creatinine on 03/15/2019 was 1.1.    BPH WITH LUTS  (prostate and/or bladder) IPSS score: 23/3   PVR: 0 mL   Previous score: 16/3   Previous PVR: 10 mL  Major complaint(s):  Frequency, long standing.  Denies any dysuria, hematuria or suprapubic pain.   Currently taking: Toviaz 4 mg daily, tamsulosin 0.4 mg daily and finasteride 5 mg daily.  His has had transurethral laser ablation of the prostate with Dr. Erlene Quan on 04/10/2016.     Denies any recent fevers, chills, nausea or vomiting.  He does not have a family history of PCa.  IPSS    Row Name 04/23/19 0900         International Prostate Symptom Score   How often have you had the sensation of not emptying your bladder?  About half the time     How often have you had to urinate less than every two hours?  More than half the time     How often have you found you stopped and started again several times when you urinated?  About half the time     How often have you found it difficult to postpone urination?  About half the time     How often have you had a weak urinary stream?  About half the time     How often have you had to strain to start urination?  About half the time     How many times did you typically get up at night to urinate?  4 Times     Total IPSS Score  23       Quality of Life due to urinary  symptoms   If you were to spend the rest of your life with your urinary condition just the way it is now how would you feel about that?  Mixed        Score:  1-7 Mild 8-19 Moderate 20-35 Severe   PMH: Past Medical History:  Diagnosis Date  . Anginal pain ([place])   . Arthritis   . BPH (benign prostatic hyperplasia)   . CAD (coronary artery disease)   . Cancer ([place])    skin cancer   . Chronic kidney disease    left kidney removed  . GERD (gastroesophageal reflux disease)   . History of hiatal hernia   . Hypercholesterolemia   . Hypertension   . Myocardial infarction ([place])   . Stroke [facility])     Surgical History: Past Surgical History:  Procedure Laterality Date  . COLONOSCOPY    . COLONOSCOPY WITH PROPOFOL N/A 12/31/2017   Procedure: COLONOSCOPY WITH PROPOFOL;  Surgeon: Manya Silvas, MD;  Location: [facility] ENDOSCOPY;  Service: Endoscopy;  Laterality: N/A;  . CORONARY ARTERY BYPASS GRAFT  2018   3 bypasses  . ESOPHAGOGASTRODUODENOSCOPY    . ESOPHAGOGASTRODUODENOSCOPY N/A 12/31/2017   Procedure: ESOPHAGOGASTRODUODENOSCOPY (  EGD);  Surgeon: Manya Silvas, MD;  Location: [facility] ENDOSCOPY;  Service: Endoscopy;  Laterality: N/A;  . IR GENERIC HISTORICAL  09/29/2015   IR NEPHROSTOMY PLACEMENT LEFT 09/29/2015 [name], MD [facility]-INTERV RAD  . IR GENERIC HISTORICAL  10/20/2015   IR NEPHROSTOGRAM LEFT THRU EXISTING ACCESS 10/20/2015 [facility]INTER[facility] RAD  . JOINT REPLACEMENT     right knee  . KNEE ARTHROPLASTY Left 01/15/2017   Procedure: COMPUTER ASSISTED TOTAL KNEE ARTHROPLASTY;  Surgeon: Dereck Leep, MD;  Location: [facility] ORS;  Service: Orthopedics;  Laterality: Left;  . LAPAROSCOPIC NEPHRECTOMY, HAND ASSISTED Left 11/07/2015   Procedure: HAND ASSISTED LAPAROSCOPIC NEPHRECTOMY;  Surgeon: Hollice Espy, MD;  Location: [facility] ORS;  Service: Urology;  Laterality: Left;  . LEFT HEART CATH AND CORONARY ANGIOGRAPHY N/A 06/28/2016   Procedure: Left Heart Cath and Coronary Angiography;   Surgeon: Yolonda Kida, MD;  Location: [place] CV LAB;  Service: Cardiovascular;  Laterality: N/A;  . PROSTATE ABLATION N/A 04/10/2016   Procedure: PROSTATE ABLATION;  Surgeon: Hollice Espy, MD;  Location: [facility] ORS;  Service: Urology;  Laterality: N/A;  . REPLACEMENT TOTAL KNEE Right   . THULIUM LASER TURP (TRANSURETHRAL RESECTION OF PROSTATE) N/A 04/10/2016   Procedure: THULIUM LASER TURP (TRANSURETHRAL RESECTION OF PROSTATE);  Surgeon: Hollice Espy, MD;  Location: [facility] ORS;  Service: Urology;  Laterality: N/A;  . TONSILLECTOMY      Home Medications:  Allergies as of 04/23/2019      Reactions   Peanut Oil Other (See Comments)   Mouth ulcers Mouth ulcers   Peanut-containing Drug Products Other (See Comments)   Mouth ulcers       Medication List       Accurate as of April 23, 2019 10:52 AM. If you have any questions, ask your nurse or doctor.        STOP taking these medications   FLUoxetine 20 MG capsule Commonly known as: PROZAC Stopped by: Zara Council, PA-C     TAKE these medications   acetaminophen 500 MG tablet Commonly known as: TYLENOL Take 500 mg every 6 (six) hours as needed by mouth for moderate pain or headache.   amiodarone 200 MG tablet Commonly known as: PACERONE   amLODipine 5 MG tablet Commonly known as: NORVASC Take 5 mg by mouth daily.   atorvastatin 80 MG tablet Commonly known as: LIPITOR What changed: Another medication with the same name was removed. Continue taking this medication, and follow the directions you see here. Changed by: Zara Council, PA-C   cetirizine 10 MG tablet Commonly known as: ZYRTEC Take 10 mg by mouth daily.   docusate sodium 100 MG capsule Commonly known as: COLACE Take 1 capsule (100 mg total) by mouth 2 (two) times daily.   Eliquis 5 MG Tabs tablet Generic drug: apixaban Take 5 mg 2 (two) times daily by mouth.   ferrous sulfate 325 (65 FE) MG tablet Take 325 mg by mouth daily with breakfast.    fesoterodine 4 MG Tb24 tablet Commonly known as: TOVIAZ Take 1 tablet (4 mg total) by mouth daily.   Toviaz 4 MG Tb24 tablet Generic drug: fesoterodine Take 1 tablet (4 mg total) by mouth daily.   finasteride 5 MG tablet Commonly known as: PROSCAR Take 5 mg at bedtime by mouth.   fluticasone 50 MCG/ACT nasal spray Commonly known as: FLONASE Place 2 sprays daily into both nostrils.   gabapentin 100 MG capsule Commonly known as: NEURONTIN What changed: Another medication with the same name was removed.  Continue taking this medication, and follow the directions you see here. Changed by: Zara Council, PA-C   hydrochlorothiazide 12.5 MG tablet Commonly known as: HYDRODIURIL   lisinopril 40 MG tablet Commonly known as: ZESTRIL Take 40 mg by mouth daily.   meloxicam 7.5 MG tablet Commonly known as: MOBIC Take 1 tablet (7.5 mg total) by mouth daily. What changed:   when to take this  additional instructions   multivitamin tablet Take 1 tablet by mouth daily.   omeprazole 40 MG capsule Commonly known as: PRILOSEC Take 40 mg by mouth 2 (two) times daily.   oxybutynin 5 MG tablet Commonly known as: DITROPAN Take 1 tablet (5 mg total) by mouth every 8 (eight) hours as needed for bladder spasms.   tamsulosin 0.4 MG Caps capsule Commonly known as: FLOMAX Take 0.4 mg at bedtime by mouth.   tiZANidine 2 MG tablet Commonly known as: ZANAFLEX   traMADol 50 MG tablet Commonly known as: ULTRAM Take 50 mg by mouth every 8 (eight) hours as needed for moderate pain.   traZODone 50 MG tablet Commonly known as: DESYREL Take 50 mg by mouth at bedtime.   vitamin E 180 MG (400 UNITS) capsule Take 800 Units daily by mouth.       Allergies:  Allergies  Allergen Reactions  . Peanut Oil Other (See Comments)    Mouth ulcers  Mouth ulcers  . Peanut-Containing Drug Products Other (See Comments)    Mouth ulcers     Family History: Family History  Problem Relation  Age of Onset  . Kidney cancer Mother   . Lung cancer Father   . Prostate cancer Neg Hx   . Bladder Cancer Neg Hx     Social History:  reports that he quit smoking about 35 years ago. He has a 30.00 pack-year smoking history. He has never used smokeless tobacco. He reports that he does not drink alcohol or use drugs.  ROS: For pertinent review of systems please refer to history of present illness  Physical Exam: BP (!) 141/81   Pulse 66   Ht 5\' 11"  (1.803 m)   Wt 235 lb (106.6 kg)   BMI 32.78 kg/m   Constitutional:  Well nourished. Alert and oriented, No acute distress. HEENT: [place] AT, mask in place.  Trachea midline, no masses. Cardiovascular: No clubbing, cyanosis, or edema. Respiratory: Normal respiratory effort, no increased work of breathing. GI: Abdomen is soft, non tender, non distended, no abdominal masses.   GU: No CVA tenderness.  No bladder fullness or masses.  Patient with uncircumcised phallus.  Foreskin easily retracted  Urethral meatus is patent.  No penile discharge. No penile lesions or rashes. Scrotum without lesions, cysts, rashes and/or edema.  Testicles are located scrotally bilaterally. No masses are appreciated in the testicles. Left and right epididymis are normal. Rectal: Patient with  normal sphincter tone. Anus and perineum without scarring or rashes. No rectal masses are appreciated. Prostate is approximately 35 grams, no nodules are appreciated. Seminal vesicles could not be palpated Skin: No rashes, bruises or suspicious lesions. Lymph: No inguinal adenopathy. Neurologic: Grossly intact, no focal deficits, moving all 4 extremities. Psychiatric: Normal mood and affect.  Laboratory Data: Component     Latest Ref Rng & Units 12/06/2015 09/18/2017 03/25/2018 04/22/2019  Prostate Specific Ag, Serum     0.0 - 4.0 ng/mL 0.8 2.0 0.7 0.8   Pertinent Imaging: Results for PRATHIK, MAGOUIRK" (MRN [id number]) as of 04/23/2019 11:25  Ref. Range 04/23/2019 10:46  Scan  Result Unknown 0    Assessment & Plan:    1. Solitary kidney PVR's minimal Serum creatinine normal   2. BPH with LUTS IPSS score is 23/3, it is worsening -but he is satisfied Continue conservative management, avoiding bladder irritants and timed voiding's Most bothersome symptoms is/are frequency Continue tamsulosin 0.4 mg daily and finasteride 5 mg daily RTC in 12 months for IPSS, PSA, PVR and exam   3. Frequency Patient at goal with Toviaz 4 mg daily Return to clinic in 12 months for PVR  Return in about 1 year (around 04/22/2020) for IPSS, PVR, PSA and exam.  Zara Council, [facility]  [place] [address], [place], [place] [address] [phone]

## 2019-06-14 ENCOUNTER — Other Ambulatory Visit: Payer: Self-pay | Admitting: Urology

## 2020-01-04 ENCOUNTER — Other Ambulatory Visit: Payer: Self-pay | Admitting: Urology

## 2020-01-05 ENCOUNTER — Other Ambulatory Visit: Payer: Self-pay | Admitting: Urology

## 2020-02-01 ENCOUNTER — Other Ambulatory Visit: Payer: Self-pay | Admitting: Urology

## 2020-03-10 ENCOUNTER — Other Ambulatory Visit: Payer: Self-pay | Admitting: Urology

## 2020-04-18 ENCOUNTER — Other Ambulatory Visit: Payer: Self-pay

## 2020-04-18 ENCOUNTER — Encounter: Payer: Self-pay | Admitting: Urology

## 2020-04-20 ENCOUNTER — Other Ambulatory Visit: Payer: Self-pay

## 2020-04-20 ENCOUNTER — Other Ambulatory Visit: Payer: Medicare Other

## 2020-04-20 DIAGNOSIS — N138 Other obstructive and reflux uropathy: Secondary | ICD-10-CM

## 2020-04-21 LAB — PSA: Prostate Specific Ag, Serum: 0.9 ng/mL (ref 0.0–4.0)

## 2020-04-24 NOTE — Progress Notes (Signed)
04/16/2018 9:07 AM   [name] 1945-04-16 [id number]  Referring provider: Tracie Harrier, MD [address] [facility] [place],  [place] [address]  Chief Complaint  Patient presents with  . Benign Prostatic Hypertrophy   Urological history: 1. 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 BPH with LU TS - PSA 0.9 in 03/2020 - I PSS 16/2 - PVR 12 mL - transurethral laser ablation of the prostate with Dr. Erlene Quan on 04/10/2016 - managed with tamsulosin 0.4 mg daily and finasteride 5 mg daily  3. Frequency - UA benign 03/2020   HPI: Craig [name] is a 75 yo male who presents today for a yearly follow up.    He complains of frequency -10 to 15 times a day, nocturia x 1-2, urgency and a weak urinary stream.   He has to push on his bladder to initiate the stream at times.    He has not been taking the fesoterodine due to lack of insurance coverage.  He has been without the medications for three weeks and has not noticed any difference.    Patient denies any modifying or aggravating factors.  Patient denies any gross hematuria, dysuria or suprapubic/flank pain.  Patient denies any fevers, chills, nausea or vomiting.     IPSS    Row Name 04/25/20 0800         International Prostate Symptom Score   How often have you had the sensation of not emptying your bladder? About half the time     How often have you had to urinate less than every two hours? About half the time     How often have you found you stopped and started again several times when you urinated? Less than 1 in 5 times     How often have you found it difficult to postpone urination? About half the time     How often have you had a weak urinary  stream? Less than 1 in 5 times     How often have you had to strain to start urination? About half the time     How many times did you typically get up at night to urinate? 2 Times     Total IPSS Score 16           Quality of Life due to urinary symptoms   If you were to spend the rest of your life with your urinary condition just the way it is now how would you feel about that? Mostly Satisfied            Score:  1-7 Mild 8-19 Moderate 20-35 Severe   PMH: Past Medical History:  Diagnosis Date  . Anginal pain ([place])   . Arthritis   . BPH (benign prostatic hyperplasia)   . CAD (coronary artery disease)   . Cancer ([place])    skin cancer   . Chronic kidney disease    left kidney removed  . GERD (gastroesophageal reflux disease)   . History of hiatal hernia   . Hypercholesterolemia   . Hypertension   . Myocardial infarction ([place])   . Stroke [facility])     Surgical History: Past Surgical History:  Procedure Laterality Date  . COLONOSCOPY    . COLONOSCOPY WITH PROPOFOL N/A 12/31/2017  Procedure: COLONOSCOPY WITH PROPOFOL;  Surgeon: Manya Silvas, MD;  Location: [facility] ENDOSCOPY;  Service: Endoscopy;  Laterality: N/A;  . CORONARY ARTERY BYPASS GRAFT  2018   3 bypasses  . ESOPHAGOGASTRODUODENOSCOPY    . ESOPHAGOGASTRODUODENOSCOPY N/A 12/31/2017   Procedure: ESOPHAGOGASTRODUODENOSCOPY (EGD);  Surgeon: Manya Silvas, MD;  Location[facility] ENDOSCOPY;  Service: Endoscopy;  Laterality: N/A;  . IR GENERIC HISTORICAL  09/29/2015   IR NEPHROSTOMY PLACEMENT LEFT 09/29/2015 [name], MD [facility]-INTERV RAD  . IR GENERIC HISTORICAL  10/20/2015   IR NEPHROSTOGRAM LEFT THRU EXISTING ACCESS 10/20/2015 [facility]-INTERV RAD  . JOINT REPLACEMENT     right knee  . KNEE ARTHROPLASTY Left 01/15/2017   Procedure: COMPUTER ASSISTED TOTAL KNEE ARTHROPLASTY;  Surgeon: Dereck Leep, MD;  Location: [facility] ORS;  Service: Orthopedics;  Laterality: Left;  . LAPAROSCOPIC NEPHRECTOMY, HAND ASSISTED Left  11/07/2015   Procedure: HAND ASSISTED LAPAROSCOPIC NEPHRECTOMY;  Surgeon: Hollice Espy, MD;  Location: [facility] ORS;  Service: Urology;  Laterality: Left;  . LEFT HEART CATH AND CORONARY ANGIOGRAPHY N/A 06/28/2016   Procedure: Left Heart Cath and Coronary Angiography;  Surgeon: Yolonda Kida, MD;  Location: [place] CV LAB;  Service: Cardiovascular;  Laterality: N/A;  . PROSTATE ABLATION N/A 04/10/2016   Procedure: PROSTATE ABLATION;  Surgeon: Hollice Espy, MD;  Location: [facility] ORS;  Service: Urology;  Laterality: N/A;  . REPLACEMENT TOTAL KNEE Right   . THULIUM LASER TURP (TRANSURETHRAL RESECTION OF PROSTATE) N/A 04/10/2016   Procedure: THULIUM LASER TURP (TRANSURETHRAL RESECTION OF PROSTATE);  Surgeon: Hollice Espy, MD;  Location: [facility] ORS;  Service: Urology;  Laterality: N/A;  . TONSILLECTOMY      Home Medications:  Allergies as of 04/25/2020      Reactions   Peanut Oil Other (See Comments)   Mouth ulcers Mouth ulcers   Peanut-containing Drug Products Other (See Comments)   Mouth ulcers       Medication List       Accurate as of April 25, 2020  9:07 AM. If you have any questions, ask your nurse or doctor.        acetaminophen 500 MG tablet Commonly known as: TYLENOL Take 500 mg every 6 (six) hours as needed by mouth for moderate pain or headache.   amiodarone 200 MG tablet Commonly known as: PACERONE   amLODipine 5 MG tablet Commonly known as: NORVASC Take 5 mg by mouth daily.   apixaban 5 MG Tabs tablet Commonly known as: ELIQUIS Take 5 mg 2 (two) times daily by mouth.   atorvastatin 80 MG tablet Commonly known as: LIPITOR   cetirizine 10 MG tablet Commonly known as: ZYRTEC Take 10 mg by mouth daily.   docusate sodium 100 MG capsule Commonly known as: COLACE Take 1 capsule (100 mg total) by mouth 2 (two) times daily.   ferrous sulfate 325 (65 FE) MG tablet Take 325 mg by mouth daily with breakfast.   fesoterodine 4 MG Tb24 tablet Commonly known as:  TOVIAZ Take 1 tablet (4 mg total) by mouth daily.   Toviaz 4 MG Tb24 tablet Generic drug: fesoterodine Take 1 tablet (4 mg total) by mouth daily.   finasteride 5 MG tablet Commonly known as: PROSCAR Take 5 mg at bedtime by mouth.   FLUoxetine 20 MG capsule Commonly known as: PROZAC Take 20 mg by mouth daily.   fluticasone 50 MCG/ACT nasal spray Commonly known as: FLONASE Place 2 sprays daily into both nostrils.   gabapentin 100 MG capsule Commonly known as: NEURONTIN  hydrochlorothiazide 12.5 MG tablet Commonly known as: HYDRODIURIL   lisinopril 40 MG tablet Commonly known as: ZESTRIL Take 40 mg by mouth daily.   meloxicam 7.5 MG tablet Commonly known as: MOBIC Take 1 tablet (7.5 mg total) by mouth daily. What changed:   when to take this  additional instructions   multivitamin tablet Take 1 tablet by mouth daily.   omeprazole 40 MG capsule Commonly known as: PRILOSEC Take 40 mg by mouth 2 (two) times daily.   tamsulosin 0.4 MG Caps capsule Commonly known as: FLOMAX Take 0.4 mg at bedtime by mouth.   tiZANidine 2 MG tablet Commonly known as: ZANAFLEX   traMADol 50 MG tablet Commonly known as: ULTRAM Take 50 mg by mouth every 8 (eight) hours as needed for moderate pain.   traZODone 50 MG tablet Commonly known as: DESYREL Take 50 mg by mouth at bedtime.   vitamin E 180 MG (400 UNITS) capsule Take 800 Units daily by mouth.       Allergies:  Allergies  Allergen Reactions  . Peanut Oil Other (See Comments)    Mouth ulcers  Mouth ulcers  . Peanut-Containing Drug Products Other (See Comments)    Mouth ulcers     Family History: Family History  Problem Relation Age of Onset  . Kidney cancer Mother   . Lung cancer Father   . Prostate cancer Neg Hx   . Bladder Cancer Neg Hx     Social History:  reports that he quit smoking about 36 years ago. He has a 30.00 pack-year smoking history. He has never used smokeless tobacco. He reports that he  does not drink alcohol and does not use drugs.  ROS: For pertinent review of systems please refer to history of present illness  Physical Exam: BP 129/79   Pulse 66   Ht 5' 11"  (1.803 m)   Wt 225 lb (102.1 kg)   BMI 31.38 kg/m   Constitutional:  Well nourished. Alert and oriented, No acute distress. HEENT: [place] AT, mask in place.  Trachea midline Cardiovascular: No clubbing, cyanosis, or edema. Respiratory: Normal respiratory effort, no increased work of breathing. GU: No CVA tenderness.  No bladder fullness or masses.  Patient with uncircumcised phallus.  Foreskin easily retracted  Urethral meatus is patent.  No penile discharge. No penile lesions or rashes. Scrotum without lesions, cysts, rashes and/or edema.  Testicles are located scrotally bilaterally. No masses are appreciated in the testicles. Left and right epididymis are normal. Rectal: Patient with  normal sphincter tone. Anus and perineum without scarring or rashes. No rectal masses are appreciated. Prostate is approximately 35 grams, no nodules are appreciated. Seminal vesicles could not be palpated Lymph: No inguinal adenopathy. Neurologic: Grossly intact, no focal deficits, moving all 4 extremities. Psychiatric: Normal mood and affect.  Laboratory Data: Specimen:  Blood  Ref Range & Units 2 wk ago  Glucose 70 - 110 mg/dL 128High   Sodium 136 - 145 mmol/L 141   Potassium 3.6 - 5.1 mmol/L 4.5   Chloride 97 - 109 mmol/L 106   Carbon Dioxide (CO2) 22.0 - 32.0 mmol/L 30.0   Urea Nitrogen (BUN) 7 - 25 mg/dL 21   Creatinine 0.7 - 1.3 mg/dL 1.2   Glomerular Filtration Rate (eGFR), MDRD Estimate >60 mL/min/1.73sq m 59Low   Calcium 8.7 - 10.3 mg/dL 8.6Low   AST  8 - 39 U/L 22   ALT  6 - 57 U/L 31   Alk Phos (alkaline Phosphatase) 34 - 104 U/L  76   Albumin 3.5 - 4.8 g/dL 4.0   Bilirubin, Total 0.3 - 1.2 mg/dL 0.4   Protein, Total 6.1 - 7.9 g/dL 5.9Low   A/G Ratio 1.0 - 5.0 gm/dL 2.1   Resulting Agency  [place] - LAB  Specimen Collected: 04/04/20 10:04 AM Last Resulted: 04/04/20 1:57 PM  Received From: [place]  Result Received: 04/13/20 3:42 PM   Specimen:  Blood  Ref Range & Units 2 wk ago  WBC (White Blood Cell Count) 4.1 - 10.2 10^3/uL 4.6   RBC (Red Blood Cell Count) 4.69 - 6.13 10^6/uL 4.06Low   Hemoglobin 14.1 - 18.1 gm/dL 12.8Low   Hematocrit 40.0 - 52.0 % 39.5Low   MCV (Mean Corpuscular Volume) 80.0 - 100.0 fl 97.3   MCH (Mean Corpuscular Hemoglobin) 27.0 - 31.2 pg 31.5High   MCHC (Mean Corpuscular Hemoglobin Concentration) 32.0 - 36.0 gm/dL 32.4   Platelet Count 150 - 450 10^3/uL 146Low   RDW-CV (Red Cell Distribution Width) 11.6 - 14.8 % 12.9   MPV (Mean Platelet Volume) 9.4 - 12.4 fl 10.7   Neutrophils 1.50 - 7.80 10^3/uL 2.43   Lymphocytes 1.00 - 3.60 10^3/uL 1.32   Monocytes 0.00 - 1.50 10^3/uL 0.60   Eosinophils 0.00 - 0.55 10^3/uL 0.23   Basophils 0.00 - 0.09 10^3/uL 0.02   Neutrophil % 32.0 - 70.0 % 52.8   Lymphocyte % 10.0 - 50.0 % 28.6   Monocyte % 4.0 - 13.0 % 13.0   Eosinophil % 1.0 - 5.0 % 5.0   Basophil% 0.0 - 2.0 % 0.4   Immature Granulocyte % <=0.7 % 0.2   Immature Granulocyte Count <=0.06 10^3/L 0.01   Resulting Agency  [place] - LAB  Specimen Collected: 04/04/20 10:04 AM Last Resulted: 04/04/20 11:12 AM  Received From: [place]  Result Received: 04/13/20 3:42 PM   Specimen:  Urine  Ref Range & Units 2 wk ago  Color Yellow, Violet, Light Violet, Dark Violet Yellow   Clarity Clear Clear   Specific Gravity 1.000 - 1.030 1.020   pH, Urine 5.0 - 8.0 6.5   Protein, Urinalysis Negative, Trace mg/dL Negative   Glucose, Urinalysis Negative mg/dL Negative   Ketones, Urinalysis Negative mg/dL Negative   Blood, Urinalysis Negative Negative   Nitrite, Urinalysis Negative Negative   Leukocyte Esterase, Urinalysis Negative Negative   White Blood Cells, Urinalysis None Seen, 0-3 /hpf None Seen    Red Blood Cells, Urinalysis None Seen, 0-3 /hpf None Seen   Bacteria, Urinalysis None Seen /hpf None Seen   Squamous Epithelial Cells, Urinalysis Rare, Few, None Seen /hpf None Seen   Resulting Agency  [place] - LAB  Specimen Collected: 04/04/20 10:04 AM Last Resulted: 04/04/20 12:07 PM  Received From: [place]  Result Received: 04/13/20 3:42 PM   Specimen:  Blood  Ref Range & Units 2 wk ago  Cholesterol, Total 100 - 200 mg/dL 140   Triglyceride 35 - 199 mg/dL 96   HDL (High Density Lipoprotein) Cholesterol 29.0 - 71.0 mg/dL 45.7   LDL Calculated 0 - 130 mg/dL 75   VLDL Cholesterol mg/dL 19   Cholesterol/HDL Ratio  3.1   Resulting Agency  [place] - LAB  Specimen Collected: 04/04/20 10:04 AM Last Resulted: 04/04/20 1:57 PM  Received From: [place]  Result Received: 04/13/20 3:42 PM   Specimen:  Blood  Ref Range & Units 2 wk ago  Thyroid Stimulating  Hormone (TSH) 0.450-5.330 uIU/ml uIU/mL 1.693   Resulting [place] - LAB  Specimen Collected: 04/04/20 10:04 AM Last Resulted: 04/04/20 12:46 PM  Received From: [place]  Result Received: 04/13/20 3:42 PM   Component     Latest Ref Rng & Units 12/06/2015 09/18/2017 03/25/2018 04/22/2019  Prostate Specific Ag, Serum     0.0 - 4.0 ng/mL 0.8 2.0 0.7 0.8   Component     Latest Ref Rng & Units 04/20/2020  Prostate Specific Ag, Serum     0.0 - 4.0 ng/mL 0.9    Pertinent Imaging: Results for SANTOSH, PETTER" (MRN [id number]) as of 04/25/2020 08:58  Ref. Range 04/25/2020 08:53  Scan Result Unknown 12    Assessment & Plan:    1. Solitary kidney - PVR's minimal - Serum creatinine normal  - continue to monitor serum creatinine and PVR's  2. BPH with LUTS - IPSS score is improving - Continue conservative management, avoiding bladder irritants and timed voiding's - Most bothersome symptoms is/are frequency and weak urinary  stream - Continue tamsulosin 0.4 mg daily and finasteride 5 mg daily - will discontinue PSA's at this time as his PCP is also drawing  - will schedule cystoscopy with Dr. Erlene Quan to rule out BOO as he is having to push down on his bladder to empty at times  - I have explained to the patient that they will  be scheduled for a cystoscopy in our office to evaluate their bladder.  The cystoscopy consists of passing a tube with a lens up through their urethra and into their urinary bladder.   We will inject the urethra with a lidocaine gel prior to introducing the cystoscope to help with any discomfort during the procedure.   After the procedure, they might experience blood in the urine and discomfort with urination.  This will abate after the first few voids.  I have  encouraged the patient to increase water intake  during this time.  Patient denies any allergies to lidocaine.    3. Frequency - cysto pending    Return for cystosocpy for BOO .  Zara Council, PA-C  [facility] [address] [place], [place] [address] [phone]

## 2020-04-25 ENCOUNTER — Encounter: Payer: Self-pay | Admitting: Urology

## 2020-04-25 ENCOUNTER — Ambulatory Visit (INDEPENDENT_AMBULATORY_CARE_PROVIDER_SITE_OTHER): Payer: Medicare Other | Admitting: Urology

## 2020-04-25 ENCOUNTER — Ambulatory Visit: Payer: Self-pay | Admitting: Urology

## 2020-04-25 ENCOUNTER — Other Ambulatory Visit: Payer: Self-pay

## 2020-04-25 VITALS — BP 129/79 | HR 66 | Ht 71.0 in | Wt 225.0 lb

## 2020-04-25 DIAGNOSIS — N138 Other obstructive and reflux uropathy: Secondary | ICD-10-CM

## 2020-04-25 DIAGNOSIS — Z905 Acquired absence of kidney: Secondary | ICD-10-CM

## 2020-04-25 DIAGNOSIS — R350 Frequency of micturition: Secondary | ICD-10-CM

## 2020-04-25 DIAGNOSIS — N401 Enlarged prostate with lower urinary tract symptoms: Secondary | ICD-10-CM

## 2020-04-25 LAB — BLADDER SCAN AMB NON-IMAGING: Scan Result: 12

## 2020-05-11 ENCOUNTER — Other Ambulatory Visit: Payer: Self-pay | Admitting: Urology

## 2020-05-30 NOTE — Progress Notes (Shared)
   05/30/2020   CC: No chief complaint on file.   HPI: Craig [name] is a 75 y.o.    There were no vitals taken for this visit. NED. A&Ox3.   No respiratory distress   Abd soft, NT, ND Normal phallus with bilateral descended testicles  Cystoscopy Procedure Note  Patient identification was confirmed, informed consent was obtained, and patient was prepped using Betadine solution.  Lidocaine jelly was administered per urethral meatus.     Pre-Procedure: - Inspection reveals a normal caliber ureteral meatus.  Procedure: The flexible cystoscope was introduced without difficulty - No urethral strictures/lesions are present. - {Blank multiple:19197::"Enlarged","Surgically absent","Normal"} prostate *** - {Blank multiple:19197::"Normal","Elevated","Tight"} bladder neck *** - Bilateral ureteral orifices identified - Bladder mucosa  reveals no ulcers, tumors, or lesions - No bladder stones - No trabeculation  Retroflexion shows ***   Post-Procedure: - Patient tolerated the procedure well  Assessment/ Plan:  1.    Follow Up: No follow-ups on file.   Jaclyn Shaggy, am acting as a scribe for Dr. Hollice Espy.    Ardyth Gal

## 2020-05-31 ENCOUNTER — Other Ambulatory Visit: Payer: Medicare Other | Admitting: Urology

## 2021-11-15 ENCOUNTER — Encounter: Payer: Self-pay | Admitting: *Deleted

## 2021-11-16 ENCOUNTER — Ambulatory Visit: Payer: Medicare Other | Admitting: Anesthesiology

## 2021-11-16 ENCOUNTER — Encounter
Admission: RE | Disposition: A | Discharge status: Home / Self Care | Payer: Self-pay | Source: Home / Self Care | Attending: Gastroenterology

## 2021-11-16 ENCOUNTER — Encounter: Payer: Self-pay | Admitting: *Deleted

## 2021-11-16 ENCOUNTER — Ambulatory Visit
Admission: RE | Admit: 2021-11-16 | Discharge: 2021-11-16 | Disposition: A | Discharge status: Home / Self Care | Payer: Medicare Other | Attending: Gastroenterology | Admitting: Gastroenterology

## 2021-11-16 ENCOUNTER — Other Ambulatory Visit: Payer: Self-pay

## 2021-11-16 DIAGNOSIS — K449 Diaphragmatic hernia without obstruction or gangrene: Secondary | ICD-10-CM | POA: Diagnosis not present

## 2021-11-16 DIAGNOSIS — K219 Gastro-esophageal reflux disease without esophagitis: Secondary | ICD-10-CM | POA: Insufficient documentation

## 2021-11-16 DIAGNOSIS — N400 Enlarged prostate without lower urinary tract symptoms: Secondary | ICD-10-CM | POA: Diagnosis not present

## 2021-11-16 DIAGNOSIS — Z1211 Encounter for screening for malignant neoplasm of colon: Secondary | ICD-10-CM | POA: Diagnosis present

## 2021-11-16 DIAGNOSIS — I4891 Unspecified atrial fibrillation: Secondary | ICD-10-CM | POA: Insufficient documentation

## 2021-11-16 DIAGNOSIS — Z87891 Personal history of nicotine dependence: Secondary | ICD-10-CM | POA: Insufficient documentation

## 2021-11-16 DIAGNOSIS — Z951 Presence of aortocoronary bypass graft: Secondary | ICD-10-CM | POA: Diagnosis not present

## 2021-11-16 DIAGNOSIS — K3189 Other diseases of stomach and duodenum: Secondary | ICD-10-CM | POA: Insufficient documentation

## 2021-11-16 DIAGNOSIS — Z86010 Personal history of colonic polyps: Secondary | ICD-10-CM | POA: Insufficient documentation

## 2021-11-16 DIAGNOSIS — I129 Hypertensive chronic kidney disease with stage 1 through stage 4 chronic kidney disease, or unspecified chronic kidney disease: Secondary | ICD-10-CM | POA: Diagnosis not present

## 2021-11-16 DIAGNOSIS — Z7901 Long term (current) use of anticoagulants: Secondary | ICD-10-CM | POA: Diagnosis not present

## 2021-11-16 DIAGNOSIS — E7800 Pure hypercholesterolemia, unspecified: Secondary | ICD-10-CM | POA: Insufficient documentation

## 2021-11-16 DIAGNOSIS — N189 Chronic kidney disease, unspecified: Secondary | ICD-10-CM | POA: Insufficient documentation

## 2021-11-16 DIAGNOSIS — K640 First degree hemorrhoids: Secondary | ICD-10-CM | POA: Insufficient documentation

## 2021-11-16 DIAGNOSIS — Z85828 Personal history of other malignant neoplasm of skin: Secondary | ICD-10-CM | POA: Insufficient documentation

## 2021-11-16 DIAGNOSIS — K648 Other hemorrhoids: Secondary | ICD-10-CM | POA: Insufficient documentation

## 2021-11-16 DIAGNOSIS — Z905 Acquired absence of kidney: Secondary | ICD-10-CM | POA: Diagnosis not present

## 2021-11-16 DIAGNOSIS — I2510 Atherosclerotic heart disease of native coronary artery without angina pectoris: Secondary | ICD-10-CM | POA: Insufficient documentation

## 2021-11-16 DIAGNOSIS — Q402 Other specified congenital malformations of stomach: Secondary | ICD-10-CM | POA: Diagnosis not present

## 2021-11-16 DIAGNOSIS — K222 Esophageal obstruction: Secondary | ICD-10-CM | POA: Diagnosis not present

## 2021-11-16 HISTORY — PX: COLONOSCOPY WITH PROPOFOL: SHX5780

## 2021-11-16 HISTORY — PX: ESOPHAGOGASTRODUODENOSCOPY: SHX5428

## 2021-11-16 SURGERY — COLONOSCOPY WITH PROPOFOL
Anesthesia: General

## 2021-11-16 MED ORDER — PROPOFOL 500 MG/50ML IV EMUL
INTRAVENOUS | Status: DC | PRN
  Administered 2021-11-16: 150 ug/kg/min via INTRAVENOUS

## 2021-11-16 MED ORDER — SODIUM CHLORIDE 0.9 % IV SOLN: INTRAVENOUS | Status: DC

## 2021-11-16 MED ORDER — PROPOFOL 10 MG/ML IV BOLUS
INTRAVENOUS | Status: DC | PRN
  Administered 2021-11-16: 100 mg via INTRAVENOUS

## 2021-11-16 MED ORDER — LIDOCAINE HCL (PF) 2 % IJ SOLN
INTRAMUSCULAR | Status: AC
  Filled 2021-11-16: qty 5

## 2021-11-16 MED ORDER — PROPOFOL 1000 MG/100ML IV EMUL
INTRAVENOUS | Status: AC
  Filled 2021-11-16: qty 100

## 2021-11-16 MED ORDER — LIDOCAINE HCL (CARDIAC) PF 100 MG/5ML IV SOSY
PREFILLED_SYRINGE | INTRAVENOUS | Status: DC | PRN
  Administered 2021-11-16: 50 mg via INTRAVENOUS

## 2021-11-16 NOTE — Transfer of Care (Signed)
Immediate Anesthesia Transfer of Care Note  Patient: Craig [name]  Procedure(s) Performed: COLONOSCOPY WITH PROPOFOL ESOPHAGOGASTRODUODENOSCOPY (EGD)  Patient Location: PACU and Endoscopy Unit  Anesthesia Type:General  Level of Consciousness: sedated  Airway & Oxygen Therapy: Patient Spontanous Breathing  Post-op Assessment: Report given to RN  Post vital signs: stable  Last Vitals:  Vitals Value Taken Time  BP 94/65 11/16/21 0942  Temp    Pulse 64 11/16/21 0942  Resp 13 11/16/21 0942  SpO2 93 % 11/16/21 0942    Last Pain:  Vitals:   11/16/21 0942  TempSrc:   PainSc: Asleep         Complications: No notable events documented.

## 2021-11-16 NOTE — Op Note (Signed)
[facility] Patient Name: Craig [name] Procedure Date: 11/16/2021 8:59 AM MRN: [id number] Account #: [id number] Date of Birth: Mar 01, 1945 Admit Type: Outpatient Age: 76 Room: [facility] ENDO ROOM 3 Gender: Male Note Status: Finalized Instrument Name: Michaelle Birks [id number] Procedure:             Upper GI endoscopy Indications:           Gastro-esophageal reflux disease Providers:             Andrey Farmer MD, MD Referring MD:          Tracie Harrier, MD (Referring MD) Medicines:             Monitored Anesthesia Care Complications:         No immediate complications. Estimated blood loss:                         Minimal. Procedure:             Pre-Anesthesia Assessment:                        - Prior to the procedure, a History and Physical was                         performed, and patient medications and allergies were                         reviewed. The patient is competent. The risks and                         benefits of the procedure and the sedation options and                         risks were discussed with the patient. All questions                         were answered and informed consent was obtained.                         Patient identification and proposed procedure were                         verified by the physician, the nurse, the                         anesthesiologist, the anesthetist and the technician                         in the endoscopy suite. Mental Status Examination:                         alert and oriented. Airway Examination: normal                         oropharyngeal airway and neck mobility. Respiratory                         Examination: clear to auscultation. CV Examination:  normal. Prophylactic Antibiotics: The patient does not                         require prophylactic antibiotics. Prior                         Anticoagulants: The patient has taken Eliquis                          (apixaban), last dose was 5 days prior to procedure.                         ASA Grade Assessment: III - A patient with severe                         systemic disease. After reviewing the risks and                         benefits, the patient was deemed in satisfactory                         condition to undergo the procedure. The anesthesia                         plan was to use monitored anesthesia care (MAC).                         Immediately prior to administration of medications,                         the patient was re-assessed for adequacy to receive                         sedatives. The heart rate, respiratory rate, oxygen                         saturations, blood pressure, adequacy of pulmonary                         ventilation, and response to care were monitored                         throughout the procedure. The physical status of the                         patient was re-assessed after the procedure.                        After obtaining informed consent, the endoscope was                         passed under direct vision. Throughout the procedure,                         the patient's blood pressure, pulse, and oxygen                         saturations were monitored continuously. The Endoscope  was introduced through the mouth, and advanced to the                         second part of duodenum. The upper GI endoscopy was                         accomplished without difficulty. The patient tolerated                         the procedure well. Findings:      A widely patent Schatzki ring was found in the lower third of the       esophagus. Forceps used to break open the ring.      A 3 cm hiatal hernia was present.      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      The exam of the esophagus was otherwise normal.      A single 12 mm papule (nodule) with no bleeding and no  stigmata of       recent bleeding was found in the gastric fundus. Biopsies were taken       with a cold forceps for histology. Estimated blood loss was minimal.      The examined duodenum was normal. Impression:            - Widely patent Schatzki ring.                        - 3 cm hiatal hernia.                        - Ectopic gastric mucosa in the upper third of the                         esophagus.                        - A single papule (nodule) found in the stomach.                         Biopsied.                        - Normal examined duodenum. Recommendation:        - Discharge patient to home.                        - Resume previous diet.                        - Continue present medications.                        - Await pathology results.                        - Return to referring physician as previously                         scheduled. Procedure Code(s):     --- Professional ---  37342, Esophagogastroduodenoscopy, flexible,                         transoral; with biopsy, single or multiple Diagnosis Code(s):     --- Professional ---                        K22.2, Esophageal obstruction                        K44.9, Diaphragmatic hernia without obstruction or                         gangrene                        Q40.2, Other specified congenital malformations of                         stomach                        K31.89, Other diseases of stomach and duodenum                        K21.9, Gastro-esophageal reflux disease without                         esophagitis CPT copyright 2019 American Medical Association. All rights reserved. The codes documented in this report are preliminary and upon coder review may  be revised to meet current compliance requirements. Andrey Farmer MD, MD 11/16/2021 9:45:20 AM Number of Addenda: 0 Note Initiated On: 11/16/2021 8:59 AM Estimated Blood Loss:  Estimated blood loss was minimal.       [facility]

## 2021-11-16 NOTE — Op Note (Signed)
[facility] Gastroenterology Patient Name: Craig [name] Procedure Date: 11/16/2021 8:42 AM MRN: [id number] Account #: [id number] Date of Birth: [birth year] Admit Type: Outpatient Age: 76 Room: [facility] ENDO ROOM 3 Gender: Male Note Status: Finalized Instrument Name: Jasper Riling [id number] Procedure:             Colonoscopy Indications:           Surveillance: Personal history of adenomatous polyps                         on last colonoscopy > 3 years ago Providers:             Andrey Farmer MD, MD Referring MD:          Tracie Harrier, MD (Referring MD) Medicines:             Monitored Anesthesia Care Complications:         No immediate complications. Estimated blood loss:                         Minimal. Procedure:             Pre-Anesthesia Assessment:                        - Prior to the procedure, a History and Physical was                         performed, and patient medications and allergies were                         reviewed. The patient is competent. The risks and                         benefits of the procedure and the sedation options and                         risks were discussed with the patient. All questions                         were answered and informed consent was obtained.                         Patient identification and proposed procedure were                         verified by the physician, the nurse, the                         anesthesiologist, the anesthetist and the technician                         in the endoscopy suite. Mental Status Examination:                         alert and oriented. Airway Examination: normal                         oropharyngeal airway and neck mobility. Respiratory  Examination: clear to auscultation. CV Examination:                         normal. Prophylactic Antibiotics: The patient does not                         require prophylactic antibiotics. Prior                          Anticoagulants: The patient has taken Eliquis                         (apixaban), last dose was 5 days prior to procedure.                         ASA Grade Assessment: III - A patient with severe                         systemic disease. After reviewing the risks and                         benefits, the patient was deemed in satisfactory                         condition to undergo the procedure. The anesthesia                         plan was to use monitored anesthesia care (MAC).                         Immediately prior to administration of medications,                         the patient was re-assessed for adequacy to receive                         sedatives. The heart rate, respiratory rate, oxygen                         saturations, blood pressure, adequacy of pulmonary                         ventilation, and response to care were monitored                         throughout the procedure. The physical status of the                         patient was re-assessed after the procedure.                        After obtaining informed consent, the colonoscope was                         passed under direct vision. Throughout the procedure,                         the patient's blood pressure, pulse, and oxygen  saturations were monitored continuously. The                         Colonoscope was introduced through the anus and                         advanced to the the cecum, identified by appendiceal                         orifice and ileocecal valve. The colonoscopy was                         performed without difficulty. The patient tolerated                         the procedure well. The quality of the bowel                         preparation was inadequate. Findings:      The perianal and digital rectal examinations were normal.      A 3 mm polyp was found in the ascending colon. The polyp was sessile.       The polyp was removed with a cold  snare. Resection and retrieval were       complete. Estimated blood loss was minimal.      A 3 mm polyp was found in the proximal transverse colon. The polyp was       sessile. The polyp was removed with a cold snare. Resection and       retrieval were complete. Estimated blood loss was minimal.      Internal hemorrhoids were found during retroflexion. The hemorrhoids       were Grade I (internal hemorrhoids that do not prolapse).      The exam was otherwise without abnormality on direct and retroflexion       views. Impression:            - Preparation of the colon was inadequate.                        - One 3 mm polyp in the ascending colon, removed with                         a cold snare. Resected and retrieved.                        - One 3 mm polyp in the proximal transverse colon,                         removed with a cold snare. Resected and retrieved.                        - Internal hemorrhoids.                        - The examination was otherwise normal on direct and                         retroflexion views. Recommendation:        - Discharge patient to home.                        -  Resume previous diet.                        - Continue present medications.                        - Await pathology results.                        - Repeat colonoscopy in 6 months because the bowel                         preparation was suboptimal.                        - Return to referring physician as previously                         scheduled. Procedure Code(s):     --- Professional ---                        [phone], Colonoscopy, flexible; with removal of                         tumor(s), polyp(s), or other lesion(s) by snare                         technique Diagnosis Code(s):     --- Professional ---                        Z86.010, Personal history of colonic polyps                        K64.0, First degree hemorrhoids                        K63.5, Polyp of colon CPT  copyright 2019 American Medical Association. All rights reserved. The codes documented in this report are preliminary and upon coder review may  be revised to meet current compliance requirements. Andrey Farmer MD, MD 11/16/2021 9:47:50 AM Number of Addenda: 0 Note Initiated On: 11/16/2021 8:42 AM Scope Withdrawal Time: 0 hours 6 minutes 19 seconds  Total Procedure Duration: 0 hours 11 minutes 35 seconds  Estimated Blood Loss:  Estimated blood loss was minimal.      [facility]

## 2021-11-16 NOTE — Anesthesia Postprocedure Evaluation (Signed)
Anesthesia Post Note  Patient: Craig [name]  Procedure(s) Performed: COLONOSCOPY WITH PROPOFOL ESOPHAGOGASTRODUODENOSCOPY (EGD)  Patient location during evaluation: Endoscopy Anesthesia Type: General Level of consciousness: awake and alert Pain management: pain level controlled Vital Signs Assessment: post-procedure vital signs reviewed and stable Respiratory status: spontaneous breathing, nonlabored ventilation, respiratory function stable and patient connected to nasal cannula oxygen Cardiovascular status: blood pressure returned to baseline and stable Postop Assessment: no apparent nausea or vomiting Anesthetic complications: no   No notable events documented.   Last Vitals:  Vitals:   11/16/21 0952 11/16/21 1002  BP: 107/75 (!) 132/94  Pulse: 71 60  Resp: 13 10  Temp: (!) 36 C   SpO2: 98% 100%    Last Pain:  Vitals:   11/16/21 1002  TempSrc:   PainSc: 0-No pain                 Dimas Millin

## 2021-11-16 NOTE — Interval H&P Note (Signed)
History and Physical Interval Note:  11/16/2021 9:04 AM  Craig [name]  has presented today for surgery, with the diagnosis of ph ta POLYPS.  The various methods of treatment have been discussed with the patient and family. After consideration of risks, benefits and other options for treatment, the patient has consented to  Procedure(s): COLONOSCOPY WITH PROPOFOL (N/A) as a surgical intervention.  The patient's history has been reviewed, patient examined, no change in status, stable for surgery.  I have reviewed the patient's chart and labs.  Questions were answered to the patient's satisfaction.     Lesly Rubenstein  Ok to proceed with EGD/Colonoscopy

## 2021-11-16 NOTE — H&P (Signed)
Outpatient short stay form Pre-procedure 11/16/2021  Lesly Rubenstein, MD  Primary Physician: Tracie Harrier, MD  Reason for visit:  Dysphagia and surveillance  History of present illness:    76 y/o gentleman with history of a. Fib on DOAC (last dose 5 days ago), hypertension, and BPH here for EGD/Colonoscopy for dysphagia mainly to pills and colonoscopy for Ta's on last colonoscopy about 4-5 years ago. No significant abdominal surgeries, no family history of GI malignancies.    Current Facility-Administered Medications:    0.9 %  sodium chloride infusion, , Intravenous, Continuous, Caryl Manas, Hilton Cork, MD, Last Rate: 20 mL/hr at 11/16/21 0854, New Bag at 11/16/21 0854  Medications Prior to Admission  Medication Sig Dispense Refill Last Dose   amiodarone (PACERONE) 200 MG tablet    11/15/2021   amLODipine (NORVASC) 5 MG tablet Take 5 mg by mouth daily.   11/15/2021   atorvastatin (LIPITOR) 80 MG tablet    11/15/2021   ferrous sulfate 325 (65 FE) MG tablet Take 325 mg by mouth daily with breakfast.   Past Week   finasteride (PROSCAR) 5 MG tablet Take 5 mg at bedtime by mouth.    11/15/2021   FLUoxetine (PROZAC) 20 MG capsule Take 20 mg by mouth daily.   11/15/2021   fluticasone (FLONASE) 50 MCG/ACT nasal spray Place 2 sprays daily into both nostrils.   3 11/16/2021   gabapentin (NEURONTIN) 100 MG capsule    11/15/2021   hydrochlorothiazide (HYDRODIURIL) 12.5 MG tablet    11/15/2021   lisinopril (ZESTRIL) 40 MG tablet Take 40 mg by mouth daily.   11/15/2021   Multiple Vitamin (MULTIVITAMIN) tablet Take 1 tablet by mouth daily.   11/15/2021   omeprazole (PRILOSEC) 40 MG capsule Take 40 mg by mouth 2 (two) times daily.  10 11/15/2021   tamsulosin (FLOMAX) 0.4 MG CAPS capsule Take 0.4 mg at bedtime by mouth.   3 11/15/2021   tiZANidine (ZANAFLEX) 2 MG tablet    11/15/2021   TOVIAZ 4 MG TB24 tablet Take 1 tablet (4 mg total) by mouth daily. 30 tablet 3 11/15/2021   traMADol (ULTRAM) 50 MG tablet  Take 50 mg by mouth every 8 (eight) hours as needed for moderate pain.   0 11/15/2021   traZODone (DESYREL) 50 MG tablet Take 50 mg by mouth at bedtime.   11/15/2021   vitamin E 400 UNIT capsule Take 800 Units daily by mouth.   11/15/2021   acetaminophen (TYLENOL) 500 MG tablet Take 500 mg every 6 (six) hours as needed by mouth for moderate pain or headache.      apixaban (ELIQUIS) 5 MG TABS tablet Take 5 mg 2 (two) times daily by mouth.   11/10/2021   cetirizine (ZYRTEC) 10 MG tablet Take 10 mg by mouth daily.      docusate sodium (COLACE) 100 MG capsule Take 1 capsule (100 mg total) by mouth 2 (two) times daily. 120 capsule 3    meloxicam (MOBIC) 7.5 MG tablet Take 1 tablet (7.5 mg total) by mouth daily. (Patient not taking: Reported on 11/16/2021) 30 tablet 0 Not Taking     Allergies  Allergen Reactions   Peanut Oil Other (See Comments)    Mouth ulcers  Mouth ulcers   Peanut-Containing Drug Products Other (See Comments)    Mouth ulcers      Past Medical History:  Diagnosis Date   Anginal pain ([place])    Arthritis    BPH (benign prostatic hyperplasia)    CAD (coronary  artery disease)    Cancer ([place])    skin cancer    Chronic kidney disease    left kidney removed   GERD (gastroesophageal reflux disease)    History of hiatal hernia    Hypercholesterolemia    Hypertension    Myocardial infarction [facility])    Stroke [facility])     Review of systems:  Otherwise negative.    Physical Exam  Gen: Alert, oriented. Appears stated age.  HEENT: PERRLA. Lungs: No respiratory distress CV: RRR Abd: soft, benign, no masses Ext: No edema    Planned procedures: Proceed with EGD/colonoscopy. The patient understands the nature of the planned procedure, indications, risks, alternatives and potential complications including but not limited to bleeding, infection, perforation, damage to internal organs and possible oversedation/side effects from anesthesia. The patient agrees and gives consent to  proceed.  Please refer to procedure notes for findings, recommendations and patient disposition/instructions.     Lesly Rubenstein, MD [facility] Gastroenterology

## 2021-11-16 NOTE — Anesthesia Preprocedure Evaluation (Signed)
Anesthesia Evaluation  Patient identified by MRN, date of birth, ID band Patient awake    Reviewed: Allergy & Precautions, NPO status , Patient's Chart, lab work & pertinent test results  History of Anesthesia Complications Negative for: history of anesthetic complications  Airway Mallampati: III  TM Distance: >3 FB Neck ROM: full    Dental  (+) Dental Advidsory Given, Poor Dentition   Pulmonary neg pulmonary ROS, neg sleep apnea, neg COPD, neg recent URI, former smoker,    Pulmonary exam normal        Cardiovascular hypertension, (-) angina+ CAD and + CABG  Normal cardiovascular exam     Neuro/Psych negative neurological ROS  negative psych ROS   GI/Hepatic negative GI ROS, Neg liver ROS,   Endo/Other  negative endocrine ROS  Renal/GU negative Renal ROS  negative genitourinary   Musculoskeletal   Abdominal   Peds  Hematology negative hematology ROS (+)   Anesthesia Other Findings Past Medical History: No date: Anginal pain (HCC) No date: Arthritis No date: BPH (benign prostatic hyperplasia) No date: CAD (coronary artery disease) No date: Cancer ([place])     Comment:  skin cancer  No date: Chronic kidney disease     Comment:  left kidney removed No date: GERD (gastroesophageal reflux disease) No date: History of hiatal hernia No date: Hypercholesterolemia No date: Hypertension No date: Myocardial infarction ([place]) No date: Stroke [facility])  Past Surgical History: No date: COLONOSCOPY 12/31/2017: COLONOSCOPY WITH PROPOFOL; N/A     Comment:  Procedure: COLONOSCOPY WITH PROPOFOL;  Surgeon: Manya Silvas, MD;  Location[facility] ENDOSCOPY;  Service:               Endoscopy;  Laterality: N/A; 2018: CORONARY ARTERY BYPASS GRAFT     Comment:  3 bypasses No date: ESOPHAGOGASTRODUODENOSCOPY 12/31/2017: ESOPHAGOGASTRODUODENOSCOPY; N/A     Comment:  Procedure: ESOPHAGOGASTRODUODENOSCOPY (EGD);  Surgeon:                Manya Silvas, MD;  Location: [facility] ENDOSCOPY;                Service: Endoscopy;  Laterality: N/A; 09/29/2015: IR GENERIC HISTORICAL     Comment:  IR NEPHROSTOMY PLACEMENT LEFT 09/29/2015 [name], MD               [facility]-INTERV RAD 10/20/2015: IR GENERIC HISTORICAL     Comment:  IR NEPHROSTOGRAM LEFT THRU EXISTING ACCESS 10/20/2015               [facility]INTERV RAD No date: JOINT REPLACEMENT     Comment:  right knee 01/15/2017: KNEE ARTHROPLASTY; Left     Comment:  Procedure: COMPUTER ASSISTED TOTAL KNEE ARTHROPLASTY;                Surgeon: Dereck Leep, MD;  Location: [facility] ORS;                Service: Orthopedics;  Laterality: Left; 11/07/2015: LAPAROSCOPIC NEPHRECTOMY, HAND ASSISTED; Left     Comment:  Procedure: HAND ASSISTED LAPAROSCOPIC NEPHRECTOMY;                Surgeon: Hollice Espy, MD;  Location: [facility] ORS;                Service: Urology;  Laterality: Left; 06/28/2016: LEFT HEART CATH AND CORONARY ANGIOGRAPHY; N/A     Comment:  Procedure: Left Heart Cath and Coronary Angiography;  Surgeon: Yolonda Kida, MD;  Location: [place]              CV LAB;  Service: Cardiovascular;  Laterality: N/A; 04/10/2016: PROSTATE ABLATION; N/A     Comment:  Procedure: PROSTATE ABLATION;  Surgeon: Hollice Espy,               MD;  Location: [facility] ORS;  Service: Urology;  Laterality:               N/A; No date: REPLACEMENT TOTAL KNEE; Right 04/10/2016: THULIUM LASER TURP (TRANSURETHRAL RESECTION OF PROSTATE);  N/A     Comment:  Procedure: THULIUM LASER TURP (TRANSURETHRAL RESECTION               OF PROSTATE);  Surgeon: Hollice Espy, MD;  Location:               [facility] ORS;  Service: Urology;  Laterality: N/A; No date: TONSILLECTOMY  BMI    Body Mass Index: 31.38 kg/m      Reproductive/Obstetrics negative OB ROS                             Anesthesia Physical Anesthesia Plan  ASA: 3  Anesthesia Plan: General   Post-op  Pain Management: Minimal or no pain anticipated   Induction: Intravenous  PONV Risk Score and Plan: 3 and Propofol infusion, TIVA and Ondansetron  Airway Management Planned: Nasal Cannula  Additional Equipment: None  Intra-op Plan:   Post-operative Plan:   Informed Consent: I have reviewed the patients History and Physical, chart, labs and discussed the procedure including the risks, benefits and alternatives for the proposed anesthesia with the patient or authorized representative who has indicated his/her understanding and acceptance.     Dental advisory given  Plan Discussed with: CRNA and Surgeon  Anesthesia Plan Comments: (Discussed risks of anesthesia with patient, including possibility of difficulty with spontaneous ventilation under anesthesia necessitating airway intervention, PONV, and rare risks such as cardiac or respiratory or neurological events, and allergic reactions. Discussed the role of CRNA in patient's perioperative care. Patient understands.)        Anesthesia Quick Evaluation

## 2021-11-19 ENCOUNTER — Encounter: Payer: Self-pay | Admitting: Gastroenterology

## 2021-11-20 LAB — SURGICAL PATHOLOGY

## 2023-05-07 ENCOUNTER — Ambulatory Visit (INDEPENDENT_AMBULATORY_CARE_PROVIDER_SITE_OTHER): Payer: Medicare Other | Admitting: Urology

## 2023-05-07 VITALS — BP 130/82 | HR 72 | Ht 71.0 in | Wt 221.2 lb

## 2023-05-07 DIAGNOSIS — R350 Frequency of micturition: Secondary | ICD-10-CM | POA: Diagnosis not present

## 2023-05-07 DIAGNOSIS — N401 Enlarged prostate with lower urinary tract symptoms: Secondary | ICD-10-CM | POA: Diagnosis not present

## 2023-05-07 DIAGNOSIS — N50812 Left testicular pain: Secondary | ICD-10-CM | POA: Diagnosis not present

## 2023-05-07 LAB — URINALYSIS, COMPLETE
Bilirubin, UA: NEGATIVE
Glucose, UA: NEGATIVE
Leukocytes,UA: NEGATIVE
Nitrite, UA: NEGATIVE
Protein,UA: NEGATIVE
RBC, UA: NEGATIVE
Specific Gravity, UA: >1.03 — ABNORMAL HIGH (ref 1.005–1.030)
Urobilinogen, Ur: 0.2 mg/dL (ref 0.2–1.0)
pH, UA: 5.5 (ref 5.0–7.5)

## 2023-05-07 LAB — MICROSCOPIC EXAMINATION: Bacteria, UA: NONE SEEN

## 2023-05-07 LAB — BLADDER SCAN AMB NON-IMAGING: Scan Result: 25

## 2023-05-07 NOTE — Progress Notes (Signed)
 Marcelle Overlie Plume,acting as a scribe for Vanna Scotland, MD.,have documented all relevant documentation on the behalf of Vanna Scotland, MD,as directed by  Vanna Scotland, MD while in the presence of Vanna Scotland, MD.  05/07/23 3:14 PM   Sabino Niemann 12/08/1945 [id number]  Referring provider: Barbette Reichmann, MD [address] 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 He has a history of a transurethral laser ablation of the prostate in 2018 and is currently on Flomax and Finasteride. He was last in the office in 2022 complaining of severe frequency and incomplete bladder emptying. He was scheduled with me to rule out bladder outlet obstruction or stricture due to his urinary symptoms, and he failed to return.   His most recent PSA was 1.26 no 10/14/2022.  His PVR today is 45. He reports a new onset of left-sided groin pain, which he attributes to frequent urination and associated activities. The pain has been present for a couple of months and is sometimes tender on the epididymis. He denies any urinary tract infection.   He has a history of duodenal ulcers and cannot take NSAIDs like ibuprofen.   Results for orders placed or performed in visit on 05/07/23  BLADDER SCAN AMB NON-IMAGING  Result Value Ref Range   Scan Result 25 ml     IPSS     Row Name 05/07/23 1300         International Prostate Symptom Score   How often have you had the sensation of not emptying your bladder? Almost always     How often have you had to urinate less than every two hours? About half the time     How often have you found you stopped and started again several times when you urinated? About half the time     How often have you found it difficult  to postpone urination? Less than half the time     How often have you had a weak urinary stream? About half the time     How often have you had to strain to start urination? About half the time     How many times did you typically get up at night to urinate? 3 Times     Total IPSS Score 22       Quality of Life due to urinary symptoms   If you were to spend the rest of your life with your urinary condition just the way it is now how would you feel about that? Mixed              Score:  1-7 Mild 8-19 Moderate 20-35 Severe   PMH: Past Medical History:  Diagnosis Date   Anginal pain (HCC)    Arthritis    BPH (benign prostatic hyperplasia)    CAD (coronary artery disease)    Cancer (HCC)    skin cancer    Chronic kidney disease    left kidney removed   GERD (gastroesophageal reflux disease)    History of hiatal hernia    Hypercholesterolemia    Hypertension    Myocardial infarction [facility])    Stroke [facility])     Surgical History: Past Surgical History:  Procedure Laterality Date   COLONOSCOPY  COLONOSCOPY WITH PROPOFOL N/A 12/31/2017   Procedure: COLONOSCOPY WITH PROPOFOL;  Surgeon: Scot Jun, MD;  Location: [facility] ENDOSCOPY;  Service: Endoscopy;  Laterality: N/A;   COLONOSCOPY WITH PROPOFOL N/A 11/16/2021   Procedure: COLONOSCOPY WITH PROPOFOL;  Surgeon: Regis Bill, MD;  Location: [facility] ENDOSCOPY;  Service: Endoscopy;  Laterality: N/A;   CORONARY ARTERY BYPASS GRAFT  2018   3 bypasses   ESOPHAGOGASTRODUODENOSCOPY     ESOPHAGOGASTRODUODENOSCOPY N/A 12/31/2017   Procedure: ESOPHAGOGASTRODUODENOSCOPY (EGD);  Surgeon: Scot Jun, MD;  Location: [facility] ENDOSCOPY;  Service: Endoscopy;  Laterality: N/A;   ESOPHAGOGASTRODUODENOSCOPY N/A 11/16/2021   Procedure: ESOPHAGOGASTRODUODENOSCOPY (EGD);  Surgeon: Regis Bill, MD;  Location: [facility] ENDOSCOPY;  Service: Endoscopy;  Laterality: N/A;   IR GENERIC HISTORICAL  09/29/2015   IR NEPHROSTOMY PLACEMENT  LEFT 09/29/2015 Jolaine Click, MD [facility]-INTERV RAD   IR GENERIC HISTORICAL  10/20/2015   IR NEPHROSTOGRAM LEFT THRU EXISTING ACCESS 10/20/2015 [facility]-INTERV RAD   JOINT REPLACEMENT     right knee   KNEE ARTHROPLASTY Left 01/15/2017   Procedure: COMPUTER ASSISTED TOTAL KNEE ARTHROPLASTY;  Surgeon: Donato Heinz, MD;  Location: [facility] ORS;  Service: Orthopedics;  Laterality: Left;   LAPAROSCOPIC NEPHRECTOMY, HAND ASSISTED Left 11/07/2015   Procedure: HAND ASSISTED LAPAROSCOPIC NEPHRECTOMY;  Surgeon: Vanna Scotland, MD;  Location: [facility] ORS;  Service: Urology;  Laterality: Left;   LEFT HEART CATH AND CORONARY ANGIOGRAPHY N/A 06/28/2016   Procedure: Left Heart Cath and Coronary Angiography;  Surgeon: Alwyn Pea, MD;  Location: [facility] INVASIVE CV LAB;  Service: Cardiovascular;  Laterality: N/A;   PROSTATE ABLATION N/A 04/10/2016   Procedure: PROSTATE ABLATION;  Surgeon: Vanna Scotland, MD;  Location: [facility] ORS;  Service: Urology;  Laterality: N/A;   REPLACEMENT TOTAL KNEE Right    THULIUM LASER TURP (TRANSURETHRAL RESECTION OF PROSTATE) N/A 04/10/2016   Procedure: THULIUM LASER TURP (TRANSURETHRAL RESECTION OF PROSTATE);  Surgeon: Vanna Scotland, MD;  Location: [facility] ORS;  Service: Urology;  Laterality: N/A;   TONSILLECTOMY      Home Medications:  Allergies as of 05/07/2023       Reactions   Peanut Oil Other (See Comments)   Mouth ulcers Mouth ulcers   Peanut-containing Drug Products Other (See Comments)   Mouth ulcers         Medication List        Accurate as of May 07, 2023  3:14 PM. If you have any questions, ask your nurse or doctor.          acetaminophen 500 MG tablet Commonly known as: TYLENOL Take 500 mg every 6 (six) hours as needed by mouth for moderate pain or headache.   amiodarone 200 MG tablet Commonly known as: PACERONE   amLODipine 5 MG tablet Commonly known as: NORVASC Take 5 mg by mouth daily.   apixaban 5 MG Tabs tablet Commonly known as: ELIQUIS Take 5 mg  2 (two) times daily by mouth.   atorvastatin 80 MG tablet Commonly known as: LIPITOR   cetirizine 10 MG tablet Commonly known as: ZYRTEC Take 10 mg by mouth daily.   docusate sodium 100 MG capsule Commonly known as: COLACE Take 1 capsule (100 mg total) by mouth 2 (two) times daily.   ferrous sulfate 325 (65 FE) MG tablet Take 325 mg by mouth daily with breakfast.   finasteride 5 MG tablet Commonly known as: PROSCAR Take 5 mg at bedtime by mouth.   FLUoxetine 20 MG capsule Commonly known as: PROZAC Take 20 mg by mouth  daily.   fluticasone 50 MCG/ACT nasal spray Commonly known as: FLONASE Place 2 sprays daily into both nostrils.   gabapentin 100 MG capsule Commonly known as: NEURONTIN   hydrochlorothiazide 12.5 MG tablet Commonly known as: HYDRODIURIL   lisinopril 40 MG tablet Commonly known as: ZESTRIL Take 40 mg by mouth daily.   meloxicam 7.5 MG tablet Commonly known as: MOBIC Take 1 tablet (7.5 mg total) by mouth daily.   multivitamin tablet Take 1 tablet by mouth daily.   omeprazole 40 MG capsule Commonly known as: PRILOSEC Take 40 mg by mouth 2 (two) times daily.   tamsulosin 0.4 MG Caps capsule Commonly known as: FLOMAX Take 0.4 mg at bedtime by mouth.   tiZANidine 2 MG tablet Commonly known as: ZANAFLEX   Toviaz 4 MG Tb24 tablet Generic drug: fesoterodine Take 1 tablet (4 mg total) by mouth daily.   traMADol 50 MG tablet Commonly known as: ULTRAM Take 50 mg by mouth every 8 (eight) hours as needed for moderate pain.   traZODone 50 MG tablet Commonly known as: DESYREL Take 50 mg by mouth at bedtime.   vitamin E 180 MG (400 UNITS) capsule Take 800 Units daily by mouth.        Allergies:  Allergies  Allergen Reactions   Peanut Oil Other (See Comments)    Mouth ulcers  Mouth ulcers   Peanut-Containing Drug Products Other (See Comments)    Mouth ulcers     Family History: Family History  Problem Relation Age of Onset   Kidney  cancer Mother    Lung cancer Father    Prostate cancer Neg Hx    Bladder Cancer Neg Hx     Social History:  reports that he quit smoking about 39 years ago. He started smoking about 59 years ago. He has a 30 pack-year smoking history. He has never used smokeless tobacco. He reports that he does not drink alcohol and does not use drugs.   Physical Exam: BP 130/82   Pulse 72   Ht 5\' 11"  (1.803 m)   Wt 221 lb 3.2 oz (100.3 kg)   BMI 30.85 kg/m   Constitutional:  Alert and oriented, No acute distress. HEENT: [place] AT, moist mucus membranes.  Trachea midline, no masses. GU: Tenderness noted on the left epididymis. No signs of infection. Neurologic: Grossly intact, no focal deficits, moving all 4 extremities. Psychiatric: Normal mood and affect.   Assessment & Plan:    1. BPH with LUTS - He presents with symptoms of urinary frequency, incomplete emptying, and straining, suggestive of bladder outlet obstruction or prostate-related issues.  - He is currently on Flomax and Finasteride. - A cystoscopy is planned to evaluate for possible urethral stricture, prostate regrowth, or bladder stones.  - He has been scheduled for a cystoscopy in approximately one month, avoiding Mondays and Tuesdays due to work commitments.  2. Left Testicular Pain - He reports left-sided testicular pain, which may be due to epididymal irritation.  - Examination revealed tenderness in the epididymis.  - He is advised to wear tighter underwear for scrotal support and to use a rolled-up washcloth as a scrotal pillow for additional support.  - NSAIDs are contraindicated due to a history of duodenal ulcers.  Return for cystoscopy.  I have reviewed the above documentation for accuracy and completeness, and I agree with the above.   Vanna Scotland, MD   [facility] [address], [address] [place], [place] [address] [phone]

## 2023-05-07 NOTE — Patient Instructions (Signed)

## 2023-06-26 ENCOUNTER — Encounter: Payer: Self-pay | Admitting: *Deleted

## 2023-06-27 ENCOUNTER — Other Ambulatory Visit: Payer: Self-pay

## 2023-06-27 ENCOUNTER — Encounter (HOSPITAL_COMMUNITY): Payer: Self-pay

## 2023-06-27 ENCOUNTER — Ambulatory Visit (INDEPENDENT_AMBULATORY_CARE_PROVIDER_SITE_OTHER): Admitting: Urology

## 2023-06-27 ENCOUNTER — Other Ambulatory Visit
Admission: RE | Admit: 2023-06-27 | Discharge: 2023-06-27 | Disposition: A | Discharge status: Home / Self Care | Attending: Urology | Admitting: Urology

## 2023-06-27 VITALS — BP 116/76 | HR 69 | Ht 71.0 in | Wt 214.5 lb

## 2023-06-27 DIAGNOSIS — N401 Enlarged prostate with lower urinary tract symptoms: Secondary | ICD-10-CM

## 2023-06-27 DIAGNOSIS — N50812 Left testicular pain: Secondary | ICD-10-CM

## 2023-06-27 DIAGNOSIS — Z01818 Encounter for other preprocedural examination: Secondary | ICD-10-CM | POA: Insufficient documentation

## 2023-06-27 DIAGNOSIS — R350 Frequency of micturition: Secondary | ICD-10-CM | POA: Insufficient documentation

## 2023-06-27 LAB — URINALYSIS, COMPLETE (UACMP) WITH MICROSCOPIC
Bilirubin Urine: NEGATIVE
Glucose, UA: NEGATIVE mg/dL
Hgb urine dipstick: NEGATIVE
Leukocytes,Ua: NEGATIVE
Nitrite: NEGATIVE
Protein, ur: NEGATIVE mg/dL
Specific Gravity, Urine: 1.02 (ref 1.005–1.030)
pH: 7 (ref 5.0–8.0)

## 2023-06-27 NOTE — Progress Notes (Signed)
   06/27/23  CC:  Chief Complaint  Patient presents with   Cysto    HPI: 78 year old gentleman with a personal history of BPH who underwent thulium laser ablation of the prostate in 2018 with recurrence of refractory urinary symptoms despite Flomax  and finasteride .  Prostate volume was estimated to be in the 65 cc on previous CT scans with a median lobe at the time of his initial surgery.  Prior to his first procedure, he did have a personal history of urinary retention.  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  He is now managed on Flomax  and finasteride  with little benefit.  Blood pressure 116/76, pulse 69, height 5\' 11"  (1.803 m), weight 214 lb 8 oz (97.3 kg). NED. A&Ox3.   No respiratory distress   Abd soft, NT, ND Normal phallus with bilateral descended testicles  Cystoscopy Procedure Note  Patient identification was confirmed, informed consent was obtained, and patient was prepped using Betadine solution.  Lidocaine  jelly was administered per urethral meatus.     Pre-Procedure: - Inspection reveals a normal caliber ureteral meatus.  Procedure: The flexible cystoscope was introduced without difficulty - No urethral strictures/lesions are present. - Slightly irregular prostatic fossa with sparing of the base prostatic apical tissue.  There is enlarged defect which appears to untangling the capsule on.  Left but not on the right and on the right, there is a nodule.  The bladder neck is open however narrowed/ tight, almost exactly 6 [place] just barely able to advance the scope through. - Bilateral ureteral orifices identified - Bladder mucosa  reveals no ulcers, tumors, or lesions - No bladder stones - Mild trabeculation with a few saccules  Retroflexion shows circumferential scarring around the bladder neck with the scope which is flat   Post-Procedure: - Patient tolerated the  procedure well  Assessment/ Plan:  1.  BPH with urinary obstruction/bladder neck contracture Refractory urinary symptoms likely related to bladder neck contracture and prostatic regrowth.  I do think he would benefit from laser dilation/incision of the bladder neck as well as HoLEP to address his residual prostate tissue.  He is aware that I am leaving the practice I have discussed the case with my partner, Dr. Estanislao Heimlich who will be able to perform this procedure.  Specifically we discussed the procedure itself with including the risk of urinary incontinence, damage to surrounding structures, infection, bleeding, need for postoperative catheter, this is a documentation of the others.  He was also provided with literature today.  He would like to proceed with plan.  Preoperative urine culture today.   Dustin Gimenez, MD

## 2023-06-27 NOTE — Patient Instructions (Signed)

## 2023-06-28 LAB — URINE CULTURE: Culture: <10000 — AB

## 2023-07-04 ENCOUNTER — Ambulatory Visit: Admitting: Certified Registered"

## 2023-07-04 ENCOUNTER — Encounter
Admission: RE | Disposition: A | Discharge status: Home / Self Care | Payer: Self-pay | Source: Home / Self Care | Attending: Gastroenterology

## 2023-07-04 ENCOUNTER — Ambulatory Visit
Admission: RE | Admit: 2023-07-04 | Discharge: 2023-07-04 | Disposition: A | Discharge status: Home / Self Care | Attending: Gastroenterology | Admitting: Gastroenterology

## 2023-07-04 DIAGNOSIS — I4891 Unspecified atrial fibrillation: Secondary | ICD-10-CM | POA: Insufficient documentation

## 2023-07-04 DIAGNOSIS — K640 First degree hemorrhoids: Secondary | ICD-10-CM | POA: Diagnosis not present

## 2023-07-04 DIAGNOSIS — Z6829 Body mass index (BMI) 29.0-29.9, adult: Secondary | ICD-10-CM | POA: Diagnosis not present

## 2023-07-04 DIAGNOSIS — Z09 Encounter for follow-up examination after completed treatment for conditions other than malignant neoplasm: Secondary | ICD-10-CM | POA: Diagnosis present

## 2023-07-04 DIAGNOSIS — J449 Chronic obstructive pulmonary disease, unspecified: Secondary | ICD-10-CM | POA: Insufficient documentation

## 2023-07-04 DIAGNOSIS — Z8673 Personal history of transient ischemic attack (TIA), and cerebral infarction without residual deficits: Secondary | ICD-10-CM | POA: Diagnosis not present

## 2023-07-04 DIAGNOSIS — Z951 Presence of aortocoronary bypass graft: Secondary | ICD-10-CM | POA: Diagnosis not present

## 2023-07-04 DIAGNOSIS — M1990 Unspecified osteoarthritis, unspecified site: Secondary | ICD-10-CM | POA: Diagnosis not present

## 2023-07-04 DIAGNOSIS — I209 Angina pectoris, unspecified: Secondary | ICD-10-CM | POA: Diagnosis not present

## 2023-07-04 DIAGNOSIS — I129 Hypertensive chronic kidney disease with stage 1 through stage 4 chronic kidney disease, or unspecified chronic kidney disease: Secondary | ICD-10-CM | POA: Diagnosis not present

## 2023-07-04 DIAGNOSIS — E66813 Obesity, class 3: Secondary | ICD-10-CM | POA: Diagnosis not present

## 2023-07-04 DIAGNOSIS — Z79899 Other long term (current) drug therapy: Secondary | ICD-10-CM | POA: Insufficient documentation

## 2023-07-04 DIAGNOSIS — Z7901 Long term (current) use of anticoagulants: Secondary | ICD-10-CM | POA: Diagnosis not present

## 2023-07-04 DIAGNOSIS — Z905 Acquired absence of kidney: Secondary | ICD-10-CM | POA: Diagnosis not present

## 2023-07-04 DIAGNOSIS — N189 Chronic kidney disease, unspecified: Secondary | ICD-10-CM | POA: Insufficient documentation

## 2023-07-04 DIAGNOSIS — K635 Polyp of colon: Secondary | ICD-10-CM | POA: Diagnosis not present

## 2023-07-04 DIAGNOSIS — N401 Enlarged prostate with lower urinary tract symptoms: Secondary | ICD-10-CM | POA: Insufficient documentation

## 2023-07-04 DIAGNOSIS — F0390 Unspecified dementia without behavioral disturbance: Secondary | ICD-10-CM | POA: Insufficient documentation

## 2023-07-04 DIAGNOSIS — Z87891 Personal history of nicotine dependence: Secondary | ICD-10-CM | POA: Diagnosis not present

## 2023-07-04 DIAGNOSIS — D649 Anemia, unspecified: Secondary | ICD-10-CM | POA: Diagnosis not present

## 2023-07-04 DIAGNOSIS — F32A Depression, unspecified: Secondary | ICD-10-CM | POA: Insufficient documentation

## 2023-07-04 HISTORY — DX: Major depressive disorder, single episode, unspecified: F32.9

## 2023-07-04 HISTORY — DX: Personal history of adenomatous and serrated colon polyps: Z86.0101

## 2023-07-04 HISTORY — DX: Anemia, unspecified: D64.9

## 2023-07-04 HISTORY — DX: Diaphragmatic hernia without obstruction or gangrene: K44.9

## 2023-07-04 HISTORY — DX: Urethral false passage: N36.5

## 2023-07-04 HISTORY — PX: COLONOSCOPY: SHX5424

## 2023-07-04 HISTORY — DX: Unspecified dementia, unspecified severity, without behavioral disturbance, psychotic disturbance, mood disturbance, and anxiety: F03.90

## 2023-07-04 HISTORY — DX: Primary osteoarthritis, left hand: M19.041

## 2023-07-04 SURGERY — COLONOSCOPY
Anesthesia: General

## 2023-07-04 MED ORDER — GLYCOPYRROLATE 0.2 MG/ML IJ SOLN
INTRAMUSCULAR | Status: DC | PRN
Start: 2023-07-04 — End: 2023-07-04
  Administered 2023-07-04: .2 mg via INTRAVENOUS

## 2023-07-04 MED ORDER — SODIUM CHLORIDE 0.9 % IV SOLN
INTRAVENOUS | Status: DC
  Administered 2023-07-04: 1000 mL via INTRAVENOUS

## 2023-07-04 MED ORDER — LIDOCAINE HCL (CARDIAC) PF 100 MG/5ML IV SOSY
PREFILLED_SYRINGE | INTRAVENOUS | Status: DC | PRN
  Administered 2023-07-04: 100 mg via INTRAVENOUS

## 2023-07-04 MED ORDER — PROPOFOL 500 MG/50ML IV EMUL
INTRAVENOUS | Status: DC | PRN
  Administered 2023-07-04: 80 mg via INTRAVENOUS
  Administered 2023-07-04: 110 ug/kg/min via INTRAVENOUS

## 2023-07-04 MED ORDER — PROPOFOL 10 MG/ML IV BOLUS
INTRAVENOUS | Status: AC
  Filled 2023-07-04: qty 20

## 2023-07-04 MED ORDER — GLYCOPYRROLATE 0.2 MG/ML IJ SOLN
INTRAMUSCULAR | Status: AC
  Filled 2023-07-04: qty 1

## 2023-07-04 NOTE — Interval H&P Note (Signed)
 History and Physical Interval Note:  07/04/2023 10:42 AM  Craig [name]  has presented today for surgery, with the diagnosis of hx of TA polyps.  The various methods of treatment have been discussed with the patient and family. After consideration of risks, benefits and other options for treatment, the patient has consented to  Procedure(s): COLONOSCOPY (N/A) as a surgical intervention.  The patient's history has been reviewed, patient examined, no change in status, stable for surgery.  I have reviewed the patient's chart and labs.  Questions were answered to the patient's satisfaction.     Shane Darling  Ok to proceed with colonoscopy

## 2023-07-04 NOTE — Anesthesia Preprocedure Evaluation (Signed)
 Anesthesia Evaluation  Patient identified by MRN, date of birth, ID band Patient awake    Reviewed: Allergy & Precautions, NPO status , Patient's Chart, lab work & pertinent test results  Airway Mallampati: III  TM Distance: >3 FB Neck ROM: full    Dental  (+) Teeth Intact, Caps   Pulmonary neg pulmonary ROS, COPD, Patient abstained from smoking., former smoker   Pulmonary exam normal  + decreased breath sounds      Cardiovascular Exercise Tolerance: Good hypertension, Pt. on medications + angina  + CABG  negative cardio ROS Normal cardiovascular exam Rhythm:Regular Rate:Normal     Neuro/Psych  PSYCHIATRIC DISORDERS  Depression   Dementia CVA negative neurological ROS  negative psych ROS   GI/Hepatic negative GI ROS, Neg liver ROS,  Medicated,,  Endo/Other  negative endocrine ROS  Class 3 obesity  Renal/GU Renal InsufficiencyRenal diseasenegative Renal ROS  negative genitourinary   Musculoskeletal  (+) Arthritis ,    Abdominal  (+) + obese  Peds  Hematology negative hematology ROS (+) Blood dyscrasia, anemia   Anesthesia Other Findings Past Medical History: No date: Anemia No date: Anginal pain (HCC) No date: Arthritis No date: BPH (benign prostatic hyperplasia) No date: CAD (coronary artery disease) No date: Cancer (HCC)     Comment:  skin cancer  No date: Chronic kidney disease     Comment:  left kidney removed No date: Dementia (HCC) No date: GERD (gastroesophageal reflux disease) No date: HH (hiatus hernia) No date: History of hiatal hernia No date: Hx of adenomatous colonic polyps No date: Hypercholesterolemia No date: Hypertension No date: Major depressive disorder No date: Myocardial infarction (HCC) No date: Primary osteoarthritis of both hands No date: Stroke [facility]) No date: Urethral false passage  Past Surgical History: No date: CARDIAC CATHETERIZATION No date: COLONOSCOPY 12/31/2017:  COLONOSCOPY WITH PROPOFOL ; N/A     Comment:  Procedure: COLONOSCOPY WITH PROPOFOL ;  Surgeon: Cassie Click, MD;  Location: [facility] ENDOSCOPY;  Service:               Endoscopy;  Laterality: N/A; 11/16/2021: COLONOSCOPY WITH PROPOFOL ; N/A     Comment:  Procedure: COLONOSCOPY WITH PROPOFOL ;  Surgeon:               Shane Darling, MD;  Location: [facility] ENDOSCOPY;                Service: Endoscopy;  Laterality: N/A; 2018: CORONARY ARTERY BYPASS GRAFT     Comment:  3 bypasses No date: ESOPHAGOGASTRODUODENOSCOPY 12/31/2017: ESOPHAGOGASTRODUODENOSCOPY; N/A     Comment:  Procedure: ESOPHAGOGASTRODUODENOSCOPY (EGD);  Surgeon:               Cassie Click, MD;  Location: [facility] ENDOSCOPY;                Service: Endoscopy;  Laterality: N/A; 11/16/2021: ESOPHAGOGASTRODUODENOSCOPY; N/A     Comment:  Procedure: ESOPHAGOGASTRODUODENOSCOPY (EGD);  Surgeon:               Shane Darling, MD;  Location: [facility] ENDOSCOPY;                Service: Endoscopy;  Laterality: N/A; 09/29/2015: IR GENERIC HISTORICAL     Comment:  IR NEPHROSTOMY PLACEMENT LEFT 09/29/2015 Artice Last, MD               [facility]INTERV RAD 10/20/2015: IR GENERIC HISTORICAL     Comment:  IR NEPHROSTOGRAM LEFT THRU EXISTING ACCESS 10/20/2015               [facility]INTERV RAD No date: JOINT REPLACEMENT     Comment:  right knee 01/15/2017: KNEE ARTHROPLASTY; Left     Comment:  Procedure: COMPUTER ASSISTED TOTAL KNEE ARTHROPLASTY;                Surgeon: Arlyne Lame, MD;  Location: [facility] ORS;                Service: Orthopedics;  Laterality: Left; 11/07/2015: LAPAROSCOPIC NEPHRECTOMY, HAND ASSISTED; Left     Comment:  Procedure: HAND ASSISTED LAPAROSCOPIC NEPHRECTOMY;                Surgeon: Dustin Gimenez, MD;  Location: [facility] ORS;                Service: Urology;  Laterality: Left; 06/28/2016: LEFT HEART CATH AND CORONARY ANGIOGRAPHY; N/A     Comment:  Procedure: Left Heart Cath and Coronary Angiography;                 Surgeon: Antonette Batters, MD;  Location: [facility] INVASIVE              CV LAB;  Service: Cardiovascular;  Laterality: N/A; 04/10/2016: PROSTATE ABLATION; N/A     Comment:  Procedure: PROSTATE ABLATION;  Surgeon: Dustin Gimenez,               MD;  Location: [facility] ORS;  Service: Urology;  Laterality:               N/A; No date: REPLACEMENT TOTAL KNEE; Right 04/10/2016: THULIUM LASER TURP (TRANSURETHRAL RESECTION OF PROSTATE);  N/A     Comment:  Procedure: THULIUM LASER TURP (TRANSURETHRAL RESECTION               OF PROSTATE);  Surgeon: Dustin Gimenez, MD;  Location:               [facility] ORS;  Service: Urology;  Laterality: N/A; No date: TONSILLECTOMY  BMI    Body Mass Index: 29.71 kg/m      Reproductive/Obstetrics negative OB ROS                             Anesthesia Physical Anesthesia Plan  ASA: 3  Anesthesia Plan: General   Post-op Pain Management:    Induction: Intravenous  PONV Risk Score and Plan: Propofol  infusion and TIVA  Airway Management Planned: Natural Airway and Nasal Cannula  Additional Equipment:   Intra-op Plan:   Post-operative Plan:   Informed Consent: I have reviewed the patients History and Physical, chart, labs and discussed the procedure including the risks, benefits and alternatives for the proposed anesthesia with the patient or authorized representative who has indicated his/her understanding and acceptance.     Dental Advisory Given  Plan Discussed with: CRNA  Anesthesia Plan Comments:        Anesthesia Quick Evaluation

## 2023-07-04 NOTE — Op Note (Signed)
 [facility] Gastroenterology Patient Name: Craig [name] Procedure Date: 07/04/2023 10:42 AM MRN: [id number] Account #: [id number] Date of Birth: 19-Jan-1946 Admit Type: Outpatient Age: 78 Room: [facility] ENDO ROOM 3 Gender: Male Note Status: Finalized Instrument Name: Charlyn Cooley [id number] Procedure:             Colonoscopy Indications:           Surveillance: History of adenomatous polyps,                         inadequate prep on last exam (<28yr) Providers:             Leida Puna MD, MD Referring MD:          Antonio Baumgarten, MD (Referring MD) Medicines:             Monitored Anesthesia Care Complications:         No immediate complications. Estimated blood loss:                         Minimal. Procedure:             Pre-Anesthesia Assessment:                        - Prior to the procedure, a History and Physical was                         performed, and patient medications and allergies were                         reviewed. The patient is competent. The risks and                         benefits of the procedure and the sedation options and                         risks were discussed with the patient. All questions                         were answered and informed consent was obtained.                         Patient identification and proposed procedure were                         verified by the physician, the nurse, the                         anesthesiologist, the anesthetist and the technician                         in the endoscopy suite. Mental Status Examination:                         alert and oriented. Airway Examination: normal                         oropharyngeal airway and neck mobility. Respiratory  Examination: clear to auscultation. CV Examination:                         normal. Prophylactic Antibiotics: The patient does not                         require prophylactic antibiotics. Prior                          Anticoagulants: The patient has taken Eliquis                          (apixaban ), last dose was 4 days prior to procedure.                         ASA Grade Assessment: III - A patient with severe                         systemic disease. After reviewing the risks and                         benefits, the patient was deemed in satisfactory                         condition to undergo the procedure. The anesthesia                         plan was to use monitored anesthesia care (MAC).                         Immediately prior to administration of medications,                         the patient was re-assessed for adequacy to receive                         sedatives. The heart rate, respiratory rate, oxygen                         saturations, blood pressure, adequacy of pulmonary                         ventilation, and response to care were monitored                         throughout the procedure. The physical status of the                         patient was re-assessed after the procedure.                        After obtaining informed consent, the colonoscope was                         passed under direct vision. Throughout the procedure,                         the patient's blood pressure, pulse, and oxygen  saturations were monitored continuously. The                         Colonoscope was introduced through the anus and                         advanced to the the cecum, identified by appendiceal                         orifice and ileocecal valve. The colonoscopy was                         somewhat difficult due to significant looping.                         Successful completion of the procedure was aided by                         applying abdominal pressure. The patient tolerated the                         procedure well. The quality of the bowel preparation                         was adequate to identify polyps. The ileocecal valve,                          appendiceal orifice, and rectum were photographed. Findings:      The perianal and digital rectal examinations were normal.      A 2 mm polyp was found in the proximal transverse colon. The polyp was       sessile. The polyp was removed with a cold snare. Resection was       complete, but the polyp tissue was not retrieved. Estimated blood loss       was minimal.      Internal hemorrhoids were found during retroflexion. The hemorrhoids       were Grade I (internal hemorrhoids that do not prolapse).      The exam was otherwise without abnormality on direct and retroflexion       views. Impression:            - One 2 mm polyp in the proximal transverse colon,                         removed with a cold snare. Complete resection. Polyp                         tissue not retrieved.                        - Internal hemorrhoids.                        - The examination was otherwise normal on direct and                         retroflexion views. Recommendation:        - Discharge patient to home.                        -  Resume previous diet.                        - Continue present medications.                        - Await pathology results.                        - Repeat colonoscopy is not recommended due to current                         age (23 years or older) for surveillance.                        - Return to referring physician as previously                         scheduled. Procedure Code(s):     --- Professional ---                        [phone], Colonoscopy, flexible; with removal of                         tumor(s), polyp(s), or other lesion(s) by snare                         technique Diagnosis Code(s):     --- Professional ---                        Z86.010, Personal history of colonic polyps                        D12.3, Benign neoplasm of transverse colon (hepatic                         flexure or splenic flexure)                        K64.0, First  degree hemorrhoids CPT copyright 2022 [employer]. All rights reserved. The codes documented in this report are preliminary and upon coder review may  be revised to meet current compliance requirements. Leida Puna MD, MD 07/04/2023 11:19:19 AM Number of Addenda: 0 Note Initiated On: 07/04/2023 10:42 AM Scope Withdrawal Time: 0 hours 11 minutes 53 seconds  Total Procedure Duration: 0 hours 21 minutes 40 seconds  Estimated Blood Loss:  Estimated blood loss was minimal.      [facility]

## 2023-07-04 NOTE — Transfer of Care (Signed)
 Immediate Anesthesia Transfer of Care Note  Patient: Craig [name]  Procedure(s) Performed: COLONOSCOPY  Patient Location: PACU  Anesthesia Type:General  Level of Consciousness: drowsy  Airway & Oxygen Therapy: Patient Spontanous Breathing  Post-op Assessment: Report given to RN and Post -op Vital signs reviewed and stable  Post vital signs: Reviewed and stable  Last Vitals:  Vitals Value Taken Time  BP 154/79 1119  Temp 35.9 1119  Pulse 59 07/04/23 1119  Resp 13 07/04/23 1119  SpO2 100 % 07/04/23 1119  Vitals shown include unfiled device data.  Last Pain:  Vitals:   07/04/23 0955  TempSrc: Temporal  PainSc: 0-No pain         Complications: No notable events documented.

## 2023-07-04 NOTE — Anesthesia Postprocedure Evaluation (Signed)
 Anesthesia Post Note  Patient: Craig [name]  Procedure(s) Performed: COLONOSCOPY  Patient location during evaluation: PACU Anesthesia Type: General Level of consciousness: awake Pain management: pain level controlled Vital Signs Assessment: post-procedure vital signs reviewed and stable Respiratory status: spontaneous breathing Cardiovascular status: stable Anesthetic complications: no   No notable events documented.   Last Vitals:  Vitals:   07/04/23 0955 07/04/23 1119  BP: (!) 163/90 117/81  Pulse: (!) 55 (!) 59  Resp: 18 13  Temp: (!) 36.1 C (!) 36 C  SpO2: 100% 100%    Last Pain:  Vitals:   07/04/23 1119  TempSrc: Temporal  PainSc: Asleep                 VAN STAVEREN,Alpha Mysliwiec

## 2023-07-04 NOTE — H&P (Signed)
 Outpatient short stay form Pre-procedure 07/04/2023  Shane Darling, MD  Primary Physician: Antonio Baumgarten, MD  Reason for visit:  Surveillance  History of present illness:    78 y/o gentleman with history of a. Fib on DOAC (last dose 4 days ago), hypertension, and BPH here for Colonoscopy for history of polyps. Last colonoscopy in 2023 with inadequate prep No significant abdominal surgeries, no family history of GI malignancies.     Current Facility-Administered Medications:    0.9 %  sodium chloride  infusion, , Intravenous, Continuous, Zoeie Ritter, Leanora Prophet, MD, Last Rate: 20 mL/hr at 07/04/23 1041, Continued from Pre-op at 07/04/23 1041  Medications Prior to Admission  Medication Sig Dispense Refill Last Dose/Taking   acetaminophen  (TYLENOL ) 500 MG tablet Take 500 mg every 6 (six) hours as needed by mouth for moderate pain or headache.   Past Month   amiodarone  (PACERONE ) 200 MG tablet    07/03/2023 Morning   amLODipine (NORVASC) 5 MG tablet Take 5 mg by mouth daily.   07/03/2023 Morning   atorvastatin  (LIPITOR) 80 MG tablet    Past Week   cetirizine (ZYRTEC) 10 MG tablet Take 10 mg by mouth daily.   07/03/2023 Morning   docusate sodium  (COLACE) 100 MG capsule Take 1 capsule (100 mg total) by mouth 2 (two) times daily. 120 capsule 3 Past Month   ferrous sulfate  325 (65 FE) MG tablet Take 325 mg by mouth daily with breakfast.   Past Week   finasteride  (PROSCAR ) 5 MG tablet Take 5 mg at bedtime by mouth.    Past Week   FLUoxetine  (PROZAC ) 20 MG capsule Take 20 mg by mouth daily.   07/03/2023 Morning   fluticasone  (FLONASE ) 50 MCG/ACT nasal spray Place 2 sprays daily into both nostrils.   3 07/03/2023 Morning   gabapentin  (NEURONTIN ) 100 MG capsule    07/03/2023 Morning   hydrochlorothiazide (HYDRODIURIL) 12.5 MG tablet    07/03/2023 Morning   lisinopril  (ZESTRIL ) 40 MG tablet Take 40 mg by mouth daily.   07/03/2023 Morning   meloxicam  (MOBIC ) 7.5 MG tablet Take 1 tablet (7.5 mg total) by  mouth daily. 30 tablet 0 07/03/2023 Morning   Multiple Vitamin (MULTIVITAMIN) tablet Take 1 tablet by mouth daily.   07/03/2023 Morning   omeprazole (PRILOSEC) 40 MG capsule Take 40 mg by mouth 2 (two) times daily.  10 07/03/2023 Morning   tamsulosin  (FLOMAX ) 0.4 MG CAPS capsule Take 0.4 mg at bedtime by mouth.   3 07/03/2023 Bedtime   tiZANidine (ZANAFLEX) 2 MG tablet    07/03/2023 Morning   TOVIAZ  4 MG TB24 tablet Take 1 tablet (4 mg total) by mouth daily. 30 tablet 3 07/03/2023 Morning   traMADol  (ULTRAM ) 50 MG tablet Take 50 mg by mouth every 8 (eight) hours as needed for moderate pain.   0 07/03/2023 Morning   traZODone  (DESYREL ) 50 MG tablet Take 50 mg by mouth at bedtime.   07/03/2023 Bedtime   vitamin E  400 UNIT capsule Take 800 Units daily by mouth.   07/03/2023 Morning   apixaban  (ELIQUIS ) 5 MG TABS tablet Take 5 mg 2 (two) times daily by mouth.   05/19/2023     Allergies  Allergen Reactions   Peanut Oil Other (See Comments)    Mouth ulcers  Mouth ulcers   Peanut-Containing Drug Products Other (See Comments)    Mouth ulcers      Past Medical History:  Diagnosis Date   Anemia    Anginal pain (HCC)  Arthritis    BPH (benign prostatic hyperplasia)    CAD (coronary artery disease)    Cancer (HCC)    skin cancer    Chronic kidney disease    left kidney removed   Dementia (HCC)    GERD (gastroesophageal reflux disease)    HH (hiatus hernia)    History of hiatal hernia    Hx of adenomatous colonic polyps    Hypercholesterolemia    Hypertension    Major depressive disorder    Myocardial infarction (HCC)    Primary osteoarthritis of both hands    Stroke [facility])    Urethral false passage     Review of systems:  Otherwise negative.    Physical Exam  Gen: Alert, oriented. Appears stated age.  HEENT: PERRLA. Lungs: No respiratory distress CV: RRR Abd: soft, benign, no masses Ext: No edema    Planned procedures: Proceed with colonoscopy. The patient understands the  nature of the planned procedure, indications, risks, alternatives and potential complications including but not limited to bleeding, infection, perforation, damage to internal organs and possible oversedation/side effects from anesthesia. The patient agrees and gives consent to proceed.  Please refer to procedure notes for findings, recommendations and patient disposition/instructions.     Shane Darling, MD [facility] Gastroenterology

## 2023-07-05 ENCOUNTER — Encounter: Payer: Self-pay | Admitting: Gastroenterology

## 2023-11-17 ENCOUNTER — Other Ambulatory Visit: Payer: Self-pay | Admitting: Internal Medicine

## 2023-11-17 DIAGNOSIS — Z0000 Encounter for general adult medical examination without abnormal findings: Secondary | ICD-10-CM

## 2023-11-17 DIAGNOSIS — R634 Abnormal weight loss: Secondary | ICD-10-CM

## 2023-11-17 DIAGNOSIS — M549 Dorsalgia, unspecified: Secondary | ICD-10-CM

## 2023-11-17 DIAGNOSIS — Z905 Acquired absence of kidney: Secondary | ICD-10-CM

## 2023-11-21 ENCOUNTER — Ambulatory Visit
Admission: RE | Admit: 2023-11-21 | Discharge: 2023-11-21 | Disposition: A | Discharge status: Home / Self Care | Source: Ambulatory Visit | Attending: Internal Medicine | Admitting: Internal Medicine

## 2023-11-21 DIAGNOSIS — M549 Dorsalgia, unspecified: Secondary | ICD-10-CM | POA: Diagnosis present

## 2023-11-21 DIAGNOSIS — Z0000 Encounter for general adult medical examination without abnormal findings: Secondary | ICD-10-CM | POA: Insufficient documentation

## 2023-11-21 DIAGNOSIS — R634 Abnormal weight loss: Secondary | ICD-10-CM | POA: Diagnosis present

## 2023-11-21 DIAGNOSIS — Z905 Acquired absence of kidney: Secondary | ICD-10-CM | POA: Diagnosis present

## 2023-11-21 MED ORDER — IOHEXOL 300 MG/ML  SOLN
80.0000 mL | Freq: Once | INTRAMUSCULAR | Status: AC | PRN
  Administered 2023-11-21: 80 mL via INTRAVENOUS
